# Patient Record
Sex: Female | Born: 1945 | Race: White | Hispanic: No | Smoking: Former smoker
Health system: Southern US, Community
[De-identification: ages and names within clinical notes are randomized; demographics above are authoritative.]

## PROBLEM LIST (undated history)

## (undated) DIAGNOSIS — M1990 Unspecified osteoarthritis, unspecified site: Secondary | ICD-10-CM

## (undated) DIAGNOSIS — E039 Hypothyroidism, unspecified: Secondary | ICD-10-CM

## (undated) DIAGNOSIS — I10 Essential (primary) hypertension: Secondary | ICD-10-CM

## (undated) HISTORY — PX: HAND SURGERY: SHX662

## (undated) HISTORY — DX: Hypothyroidism, unspecified: E03.9

## (undated) HISTORY — PX: TOTAL KNEE ARTHROPLASTY: SHX125

## (undated) HISTORY — DX: Unspecified osteoarthritis, unspecified site: M19.90

## (undated) HISTORY — PX: CATARACT EXTRACTION, BILATERAL: SHX1313

---

## 1997-03-06 ENCOUNTER — Ambulatory Visit (HOSPITAL_COMMUNITY): Admission: RE | Admit: 1997-03-06 | Discharge: 1997-03-06 | Payer: Self-pay | Admitting: *Deleted

## 1998-07-16 ENCOUNTER — Ambulatory Visit (HOSPITAL_COMMUNITY): Admission: RE | Admit: 1998-07-16 | Discharge: 1998-07-16 | Payer: Self-pay | Admitting: *Deleted

## 1998-08-18 ENCOUNTER — Emergency Department (HOSPITAL_COMMUNITY): Admission: EM | Admit: 1998-08-18 | Discharge: 1998-08-18 | Payer: Self-pay | Admitting: Emergency Medicine

## 1998-08-19 ENCOUNTER — Encounter: Payer: Self-pay | Admitting: Emergency Medicine

## 1999-02-20 ENCOUNTER — Other Ambulatory Visit: Admission: RE | Admit: 1999-02-20 | Discharge: 1999-02-20 | Payer: Self-pay | Admitting: *Deleted

## 1999-07-17 ENCOUNTER — Ambulatory Visit (HOSPITAL_COMMUNITY): Admission: RE | Admit: 1999-07-17 | Discharge: 1999-07-17 | Payer: Self-pay | Admitting: *Deleted

## 2000-03-12 ENCOUNTER — Other Ambulatory Visit: Admission: RE | Admit: 2000-03-12 | Discharge: 2000-03-12 | Payer: Self-pay | Admitting: *Deleted

## 2001-12-08 ENCOUNTER — Ambulatory Visit (HOSPITAL_COMMUNITY): Admission: RE | Admit: 2001-12-08 | Discharge: 2001-12-08 | Payer: Self-pay | Admitting: *Deleted

## 2002-08-13 ENCOUNTER — Emergency Department (HOSPITAL_COMMUNITY): Admission: EM | Admit: 2002-08-13 | Discharge: 2002-08-13 | Payer: Self-pay | Admitting: Emergency Medicine

## 2002-08-13 ENCOUNTER — Encounter: Payer: Self-pay | Admitting: Emergency Medicine

## 2002-08-16 ENCOUNTER — Encounter: Payer: Self-pay | Admitting: Family Medicine

## 2002-08-16 ENCOUNTER — Ambulatory Visit (HOSPITAL_COMMUNITY): Admission: RE | Admit: 2002-08-16 | Discharge: 2002-08-16 | Payer: Self-pay | Admitting: Family Medicine

## 2003-01-16 ENCOUNTER — Ambulatory Visit (HOSPITAL_COMMUNITY): Admission: RE | Admit: 2003-01-16 | Discharge: 2003-01-16 | Payer: Self-pay | Admitting: *Deleted

## 2005-03-04 ENCOUNTER — Encounter (INDEPENDENT_AMBULATORY_CARE_PROVIDER_SITE_OTHER): Payer: Self-pay | Admitting: *Deleted

## 2005-03-04 ENCOUNTER — Ambulatory Visit (HOSPITAL_BASED_OUTPATIENT_CLINIC_OR_DEPARTMENT_OTHER): Admission: RE | Admit: 2005-03-04 | Discharge: 2005-03-04 | Payer: Self-pay | Admitting: Orthopedic Surgery

## 2005-04-17 ENCOUNTER — Encounter: Admission: RE | Admit: 2005-04-17 | Discharge: 2005-04-17 | Payer: Self-pay | Admitting: Family Medicine

## 2006-07-12 ENCOUNTER — Encounter: Admission: RE | Admit: 2006-07-12 | Discharge: 2006-07-12 | Payer: Self-pay | Admitting: Family Medicine

## 2007-12-08 ENCOUNTER — Encounter: Admission: RE | Admit: 2007-12-08 | Discharge: 2007-12-08 | Payer: Self-pay | Admitting: Family Medicine

## 2009-06-11 ENCOUNTER — Other Ambulatory Visit: Admission: RE | Admit: 2009-06-11 | Discharge: 2009-06-11 | Payer: Self-pay | Admitting: Family Medicine

## 2010-02-23 ENCOUNTER — Encounter: Payer: Self-pay | Admitting: Family Medicine

## 2010-06-17 ENCOUNTER — Other Ambulatory Visit: Payer: Self-pay | Admitting: Family Medicine

## 2010-06-17 DIAGNOSIS — Z1231 Encounter for screening mammogram for malignant neoplasm of breast: Secondary | ICD-10-CM

## 2010-06-20 ENCOUNTER — Ambulatory Visit
Admission: RE | Admit: 2010-06-20 | Discharge: 2010-06-20 | Disposition: A | Discharge status: Home / Self Care | Payer: BC Managed Care – PPO | Source: Ambulatory Visit | Attending: Family Medicine | Admitting: Family Medicine

## 2010-06-20 DIAGNOSIS — Z1231 Encounter for screening mammogram for malignant neoplasm of breast: Secondary | ICD-10-CM

## 2010-06-20 NOTE — Op Note (Signed)
NAMEOSA, FOGARTY                  ACCOUNT NO.:  [id number]   MEDICAL RECORD NO.:  [id number]          PATIENT TYPE:  AMB   LOCATION:  DSC                          FACILITY:  [facility]   PHYSICIAN:  Leonides Grills, M.D.     DATE OF BIRTH:  Jul 21, 1945   DATE OF PROCEDURE:  03/04/2005  DATE OF DISCHARGE:                                 OPERATIVE REPORT   PREOPERATIVE DIAGNOSIS:  Right third webspace Morton's neuroma.   POSTOPERATIVE DIAGNOSIS:  Right third webspace Morton's neuroma.   OPERATION PERFORMED:  Excision right third webspace Morton's neuroma.   SURGEON:  Leonides Grills, M.D.   ASSISTANT:  Lianne Cure, P.A.   ANESTHESIA:  General.   ESTIMATED BLOOD LOSS:  Minimal.   TOURNIQUET TIME:  Approximately 20 minutes.   COMPLICATIONS:  None.   DISPOSITION:  Stable to PR.   INDICATIONS FOR PROCEDURE:  The patient is a 65 year old female who has had  persistent right forefoot pain that is interfering with her life to the  point where she can not do what she wants to do.  Physical exam and  objective findings were consistent with a Morton's neuroma.  The patient  has consented for the above procedure.  All risks which include infection,  neurovascular injury, persistent pain, worsening pain, prolonged recovery,  recurrence, bleeding and hematoma formation were all explained, questions  were encouraged and answered.   DESCRIPTION OF PROCEDURE:  The patient was brought to the operating room and  placed in supine position after adequate general endotracheal tube  anesthesia was administered as well as Ancef 1 g IV piggyback.  The right  lower extremity was then prepped and draped in sterile manner over a  proximally placed thigh tourniquet.  The limb was gravity exsanguinated and  the tourniquet was elevated to 290 mmHg.  A longitudinal incision over the  dorsal aspect of the right third webspace of the forefoot was then made,  dissection was carried down through skin.  Hemostasis  was obtained.  Dissection was carried between the third and fourth metatarsal head.  Metatarsal head spreader was applied and the intermetatarsal ligament was  then released.  There was a large neuroma just distal to the intermetatarsal  ligament. Distally this was dissected out and the nerve was cut to each  individual digital branch of third and fourth toes.  We then dissected  carefully the nerve proximally to about 1 to 2 cm proximal to the metatarsal  heads into the muscular part of the foot. This was then cut and then given  to pathology.  Tourniquet was  deflated.  Hemostasis was obtained.  The area was copiously irrigated with  normal saline.  Subcu was closed with 3-0 Vicryl.  Skin was closed with 4-0  nylon.  Sterile dressing was applied.  Hard sole shoe was applied.  The  patient was stable to the PR.      Leonides Grills, M.D.  Electronically Signed     PB/MEDQ  D:  03/04/2005  T:  03/04/2005  Job:  [id number]

## 2012-04-06 ENCOUNTER — Other Ambulatory Visit: Payer: Self-pay

## 2012-04-06 DIAGNOSIS — Z1231 Encounter for screening mammogram for malignant neoplasm of breast: Secondary | ICD-10-CM

## 2012-05-03 ENCOUNTER — Ambulatory Visit: Payer: BC Managed Care – PPO

## 2012-06-09 ENCOUNTER — Ambulatory Visit
Admission: RE | Admit: 2012-06-09 | Discharge: 2012-06-09 | Disposition: A | Discharge status: Home / Self Care | Payer: Medicare Other | Source: Ambulatory Visit

## 2012-06-09 DIAGNOSIS — Z1231 Encounter for screening mammogram for malignant neoplasm of breast: Secondary | ICD-10-CM

## 2012-06-09 IMAGING — MG MM DIGITAL SCREENING BILAT
8 series · 8 of 24 positions shown · non-contrast
Comparison: Previous exams.

CLINICAL DATA: Screening.

DIGITAL BILATERAL SCREENING MAMMOGRAM WITH CAD
 DIGITAL BREAST TOMOSYNTHESIS
Digital breast tomosynthesis images are acquired in two
projections.  These images are reviewed in combination with the
digital mammogram, confirming the findings below.

[L CC]
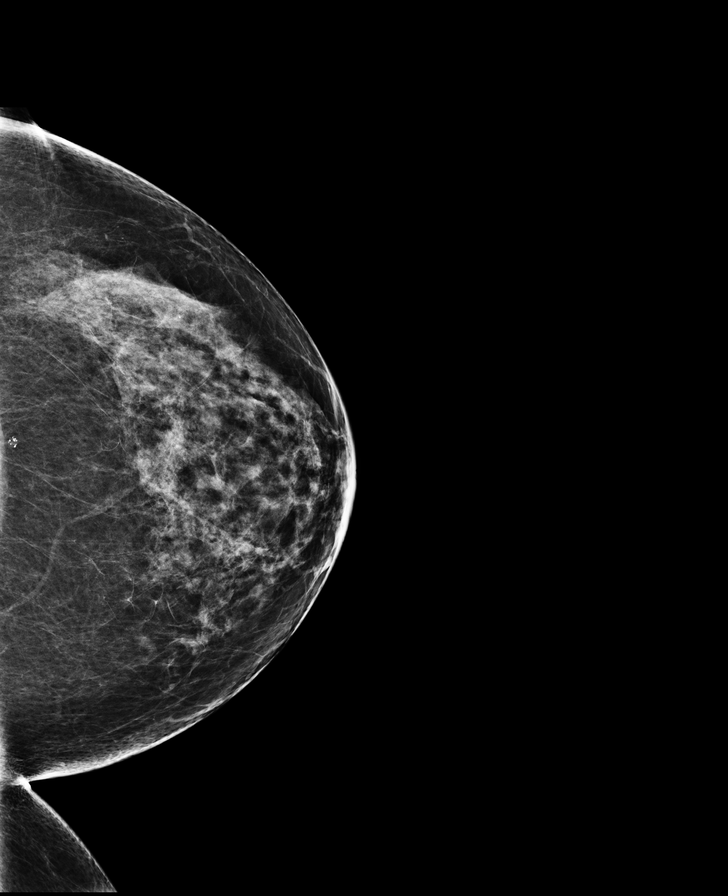

[R CC]
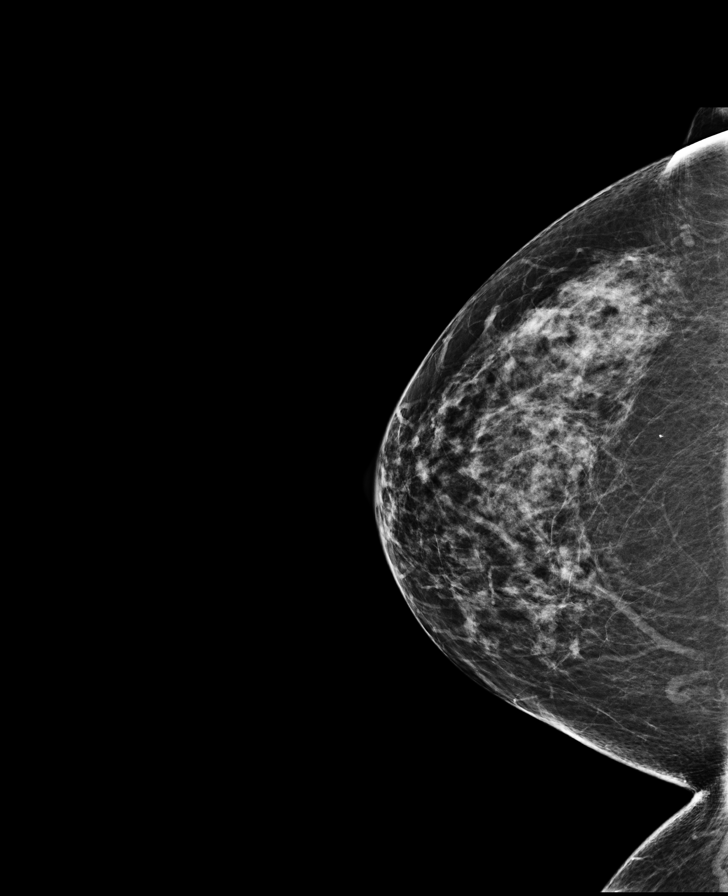

[R MLO]
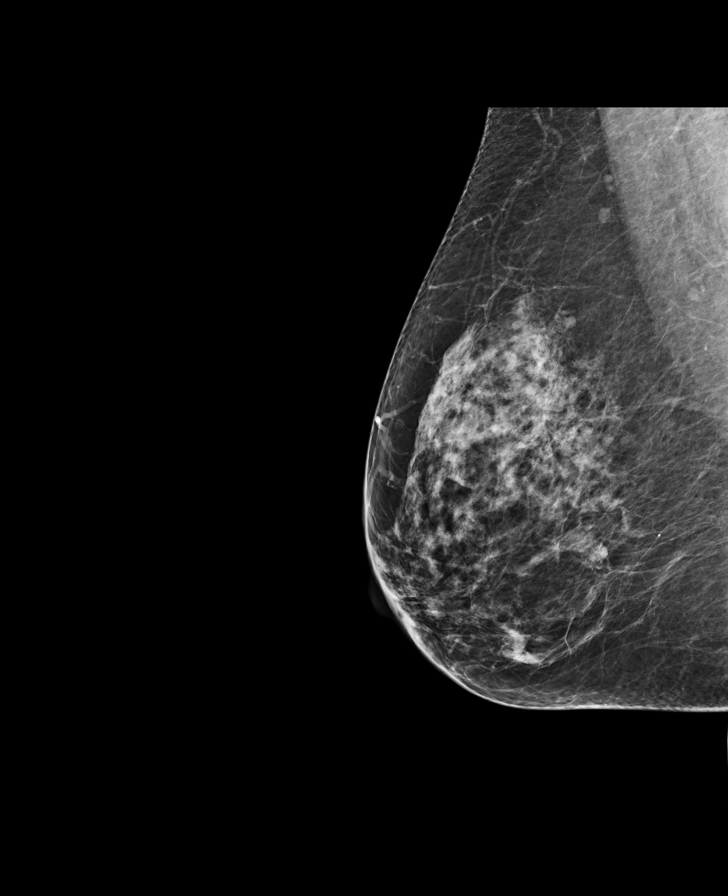

[L MLO]
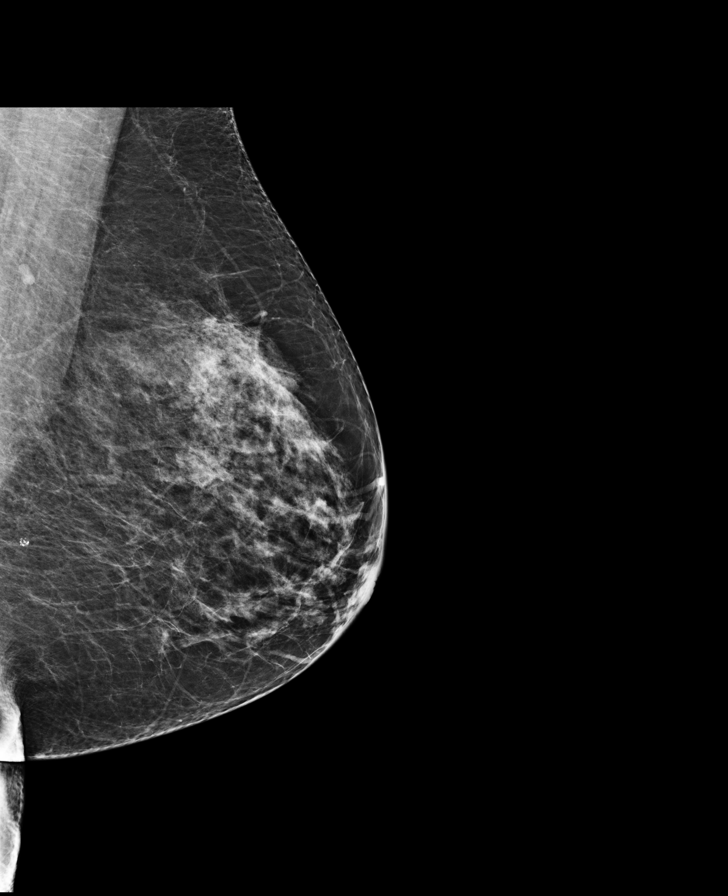

[L MLO tomo · tomo slice 37/73.0]
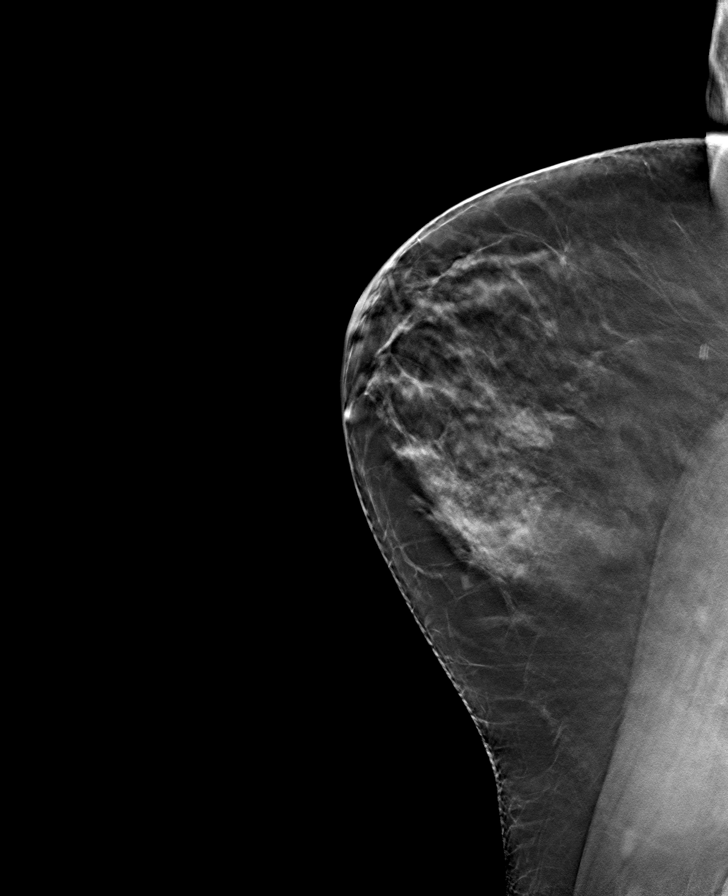

[R CC tomo · tomo slice 35/70.0]
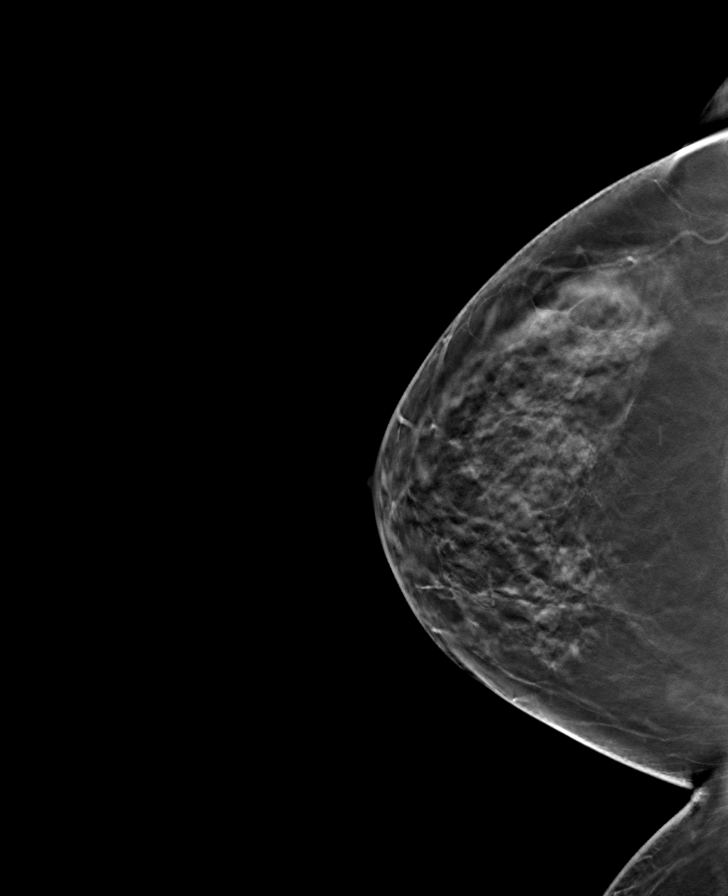

[R MLO tomo · tomo slice 35/70.0]
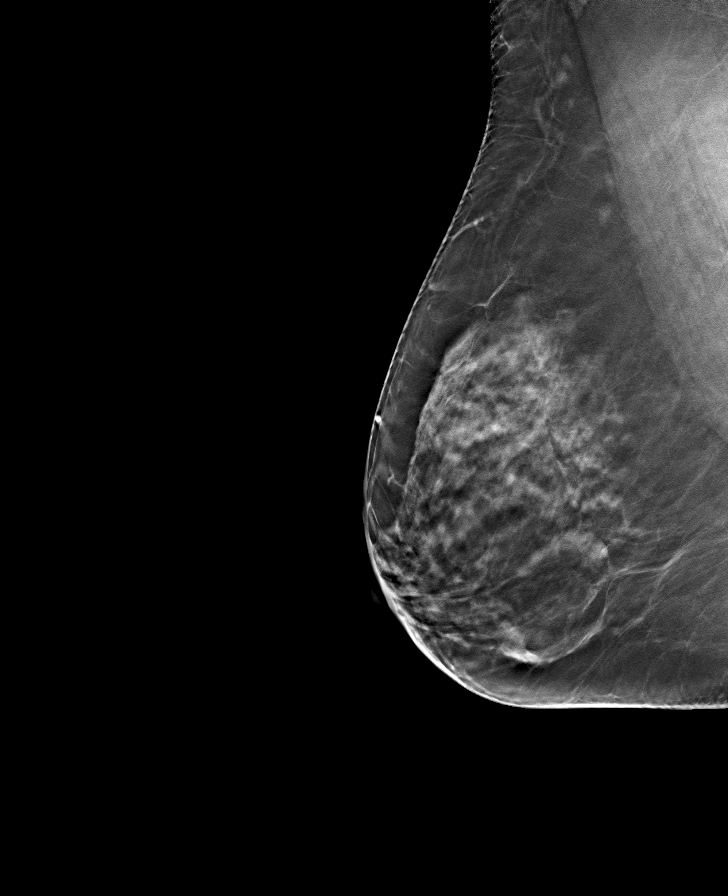

[L CC tomo · tomo slice 37/72.0]
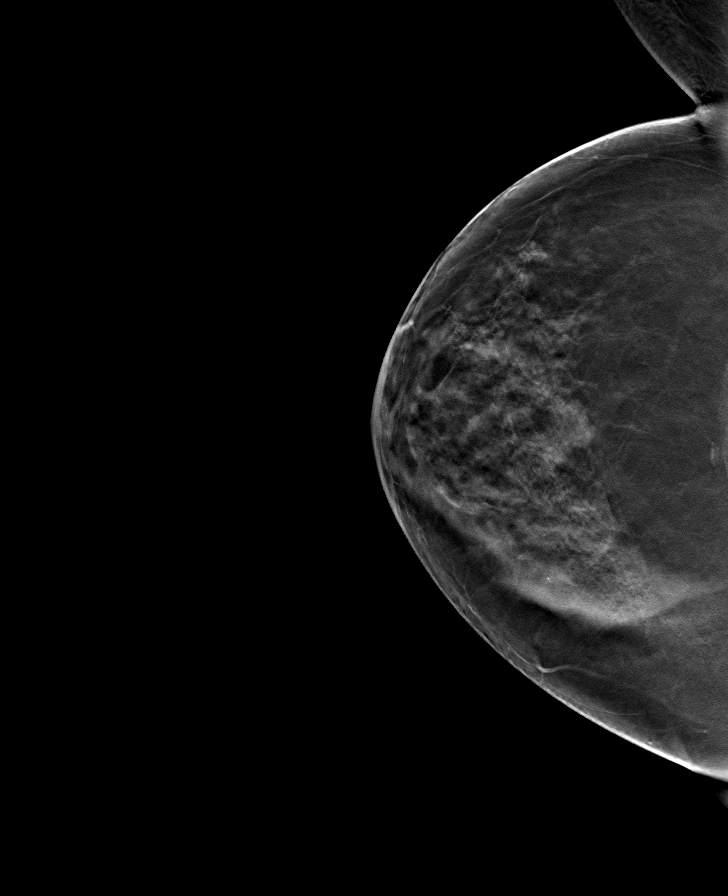

[8 of 24 positions shown; findings below may reference images not displayed]

FINDINGS: ACR Breast Density Category 3: The breast tissue is heterogeneously
dense.

No suspicious masses, architectural distortion, or calcifications
are present.

Images were processed with CAD.
IMPRESSION: No mammographic evidence of malignancy.

A result letter of this screening mammogram will be mailed directly
to the patient.

RECOMMENDATION:
Screening mammogram in one year. (Code:[86])

BI-RADS CATEGORY 1:  Negative.

## 2015-05-07 DIAGNOSIS — D3131 Benign neoplasm of right choroid: Secondary | ICD-10-CM | POA: Diagnosis not present

## 2015-05-07 DIAGNOSIS — H25013 Cortical age-related cataract, bilateral: Secondary | ICD-10-CM | POA: Diagnosis not present

## 2015-07-25 ENCOUNTER — Other Ambulatory Visit: Payer: Self-pay | Admitting: Family Medicine

## 2015-07-25 DIAGNOSIS — Z1231 Encounter for screening mammogram for malignant neoplasm of breast: Secondary | ICD-10-CM

## 2015-08-09 ENCOUNTER — Ambulatory Visit: Payer: Medicare Other

## 2015-08-16 DIAGNOSIS — M25562 Pain in left knee: Secondary | ICD-10-CM | POA: Diagnosis not present

## 2015-08-21 ENCOUNTER — Ambulatory Visit: Payer: Medicare Other

## 2015-08-21 ENCOUNTER — Ambulatory Visit
Admission: RE | Admit: 2015-08-21 | Discharge: 2015-08-21 | Disposition: A | Discharge status: Home / Self Care | Payer: Medicare Other | Source: Ambulatory Visit | Attending: Family Medicine | Admitting: Family Medicine

## 2015-08-21 DIAGNOSIS — Z1231 Encounter for screening mammogram for malignant neoplasm of breast: Secondary | ICD-10-CM

## 2015-08-25 DIAGNOSIS — J302 Other seasonal allergic rhinitis: Secondary | ICD-10-CM | POA: Diagnosis not present

## 2015-08-25 DIAGNOSIS — H60392 Other infective otitis externa, left ear: Secondary | ICD-10-CM | POA: Diagnosis not present

## 2015-09-11 DIAGNOSIS — R42 Dizziness and giddiness: Secondary | ICD-10-CM | POA: Diagnosis not present

## 2015-09-11 DIAGNOSIS — I10 Essential (primary) hypertension: Secondary | ICD-10-CM | POA: Diagnosis not present

## 2015-09-11 DIAGNOSIS — Z23 Encounter for immunization: Secondary | ICD-10-CM | POA: Diagnosis not present

## 2015-09-11 DIAGNOSIS — E039 Hypothyroidism, unspecified: Secondary | ICD-10-CM | POA: Diagnosis not present

## 2015-09-11 DIAGNOSIS — E7800 Pure hypercholesterolemia, unspecified: Secondary | ICD-10-CM | POA: Diagnosis not present

## 2015-09-25 DIAGNOSIS — H8001 Otosclerosis involving oval window, nonobliterative, right ear: Secondary | ICD-10-CM | POA: Diagnosis not present

## 2015-09-25 DIAGNOSIS — H903 Sensorineural hearing loss, bilateral: Secondary | ICD-10-CM | POA: Diagnosis not present

## 2015-12-10 DIAGNOSIS — Z1211 Encounter for screening for malignant neoplasm of colon: Secondary | ICD-10-CM | POA: Diagnosis not present

## 2015-12-10 DIAGNOSIS — R197 Diarrhea, unspecified: Secondary | ICD-10-CM | POA: Diagnosis not present

## 2015-12-11 DIAGNOSIS — R197 Diarrhea, unspecified: Secondary | ICD-10-CM | POA: Diagnosis not present

## 2015-12-19 DIAGNOSIS — Z800 Family history of malignant neoplasm of digestive organs: Secondary | ICD-10-CM | POA: Diagnosis not present

## 2015-12-19 DIAGNOSIS — R197 Diarrhea, unspecified: Secondary | ICD-10-CM | POA: Diagnosis not present

## 2015-12-20 DIAGNOSIS — R197 Diarrhea, unspecified: Secondary | ICD-10-CM | POA: Diagnosis not present

## 2015-12-25 DIAGNOSIS — K5730 Diverticulosis of large intestine without perforation or abscess without bleeding: Secondary | ICD-10-CM | POA: Diagnosis not present

## 2015-12-25 DIAGNOSIS — D126 Benign neoplasm of colon, unspecified: Secondary | ICD-10-CM | POA: Diagnosis not present

## 2015-12-25 DIAGNOSIS — R197 Diarrhea, unspecified: Secondary | ICD-10-CM | POA: Diagnosis not present

## 2015-12-25 DIAGNOSIS — K52832 Lymphocytic colitis: Secondary | ICD-10-CM | POA: Diagnosis not present

## 2015-12-25 DIAGNOSIS — D124 Benign neoplasm of descending colon: Secondary | ICD-10-CM | POA: Diagnosis not present

## 2015-12-31 DIAGNOSIS — K52832 Lymphocytic colitis: Secondary | ICD-10-CM | POA: Diagnosis not present

## 2015-12-31 DIAGNOSIS — D126 Benign neoplasm of colon, unspecified: Secondary | ICD-10-CM | POA: Diagnosis not present

## 2016-01-14 DIAGNOSIS — K52832 Lymphocytic colitis: Secondary | ICD-10-CM | POA: Diagnosis not present

## 2016-03-04 DIAGNOSIS — J111 Influenza due to unidentified influenza virus with other respiratory manifestations: Secondary | ICD-10-CM | POA: Diagnosis not present

## 2016-03-04 DIAGNOSIS — R112 Nausea with vomiting, unspecified: Secondary | ICD-10-CM | POA: Diagnosis not present

## 2016-03-19 DIAGNOSIS — I10 Essential (primary) hypertension: Secondary | ICD-10-CM | POA: Diagnosis not present

## 2016-03-19 DIAGNOSIS — E039 Hypothyroidism, unspecified: Secondary | ICD-10-CM | POA: Diagnosis not present

## 2016-03-19 DIAGNOSIS — E7800 Pure hypercholesterolemia, unspecified: Secondary | ICD-10-CM | POA: Diagnosis not present

## 2016-07-23 DIAGNOSIS — H25813 Combined forms of age-related cataract, bilateral: Secondary | ICD-10-CM | POA: Diagnosis not present

## 2016-07-23 DIAGNOSIS — D3131 Benign neoplasm of right choroid: Secondary | ICD-10-CM | POA: Diagnosis not present

## 2016-07-23 DIAGNOSIS — H31091 Other chorioretinal scars, right eye: Secondary | ICD-10-CM | POA: Diagnosis not present

## 2016-07-24 ENCOUNTER — Other Ambulatory Visit: Payer: Self-pay | Admitting: Family Medicine

## 2016-07-24 DIAGNOSIS — Z1231 Encounter for screening mammogram for malignant neoplasm of breast: Secondary | ICD-10-CM

## 2016-08-24 ENCOUNTER — Ambulatory Visit: Payer: Medicare Other

## 2016-08-26 ENCOUNTER — Inpatient Hospital Stay: Admission: RE | Admit: 2016-08-26 | Payer: Medicare Other | Source: Ambulatory Visit

## 2016-09-15 DIAGNOSIS — D1801 Hemangioma of skin and subcutaneous tissue: Secondary | ICD-10-CM | POA: Diagnosis not present

## 2016-09-15 DIAGNOSIS — L820 Inflamed seborrheic keratosis: Secondary | ICD-10-CM | POA: Diagnosis not present

## 2016-09-15 DIAGNOSIS — I788 Other diseases of capillaries: Secondary | ICD-10-CM | POA: Diagnosis not present

## 2016-09-15 DIAGNOSIS — L821 Other seborrheic keratosis: Secondary | ICD-10-CM | POA: Diagnosis not present

## 2016-10-07 DIAGNOSIS — K52832 Lymphocytic colitis: Secondary | ICD-10-CM | POA: Diagnosis not present

## 2016-11-16 DIAGNOSIS — Z23 Encounter for immunization: Secondary | ICD-10-CM | POA: Diagnosis not present

## 2016-11-16 DIAGNOSIS — E7800 Pure hypercholesterolemia, unspecified: Secondary | ICD-10-CM | POA: Diagnosis not present

## 2016-11-16 DIAGNOSIS — I10 Essential (primary) hypertension: Secondary | ICD-10-CM | POA: Diagnosis not present

## 2016-11-16 DIAGNOSIS — E039 Hypothyroidism, unspecified: Secondary | ICD-10-CM | POA: Diagnosis not present

## 2017-05-17 DIAGNOSIS — E7800 Pure hypercholesterolemia, unspecified: Secondary | ICD-10-CM | POA: Diagnosis not present

## 2017-05-17 DIAGNOSIS — F4321 Adjustment disorder with depressed mood: Secondary | ICD-10-CM | POA: Diagnosis not present

## 2017-05-17 DIAGNOSIS — E039 Hypothyroidism, unspecified: Secondary | ICD-10-CM | POA: Diagnosis not present

## 2017-05-17 DIAGNOSIS — I10 Essential (primary) hypertension: Secondary | ICD-10-CM | POA: Diagnosis not present

## 2017-11-23 DIAGNOSIS — E039 Hypothyroidism, unspecified: Secondary | ICD-10-CM | POA: Diagnosis not present

## 2017-11-23 DIAGNOSIS — Z1389 Encounter for screening for other disorder: Secondary | ICD-10-CM | POA: Diagnosis not present

## 2017-11-23 DIAGNOSIS — I10 Essential (primary) hypertension: Secondary | ICD-10-CM | POA: Diagnosis not present

## 2017-11-23 DIAGNOSIS — Z0000 Encounter for general adult medical examination without abnormal findings: Secondary | ICD-10-CM | POA: Diagnosis not present

## 2017-11-26 ENCOUNTER — Other Ambulatory Visit: Payer: Self-pay | Admitting: Family Medicine

## 2017-11-26 DIAGNOSIS — E2839 Other primary ovarian failure: Secondary | ICD-10-CM

## 2017-11-26 DIAGNOSIS — Z1231 Encounter for screening mammogram for malignant neoplasm of breast: Secondary | ICD-10-CM

## 2017-12-28 ENCOUNTER — Encounter: Payer: Self-pay | Admitting: Radiology

## 2017-12-28 ENCOUNTER — Ambulatory Visit
Admission: RE | Admit: 2017-12-28 | Discharge: 2017-12-28 | Disposition: A | Discharge status: Home / Self Care | Payer: Medicare Other | Source: Ambulatory Visit | Attending: Family Medicine | Admitting: Family Medicine

## 2017-12-28 DIAGNOSIS — Z1231 Encounter for screening mammogram for malignant neoplasm of breast: Secondary | ICD-10-CM

## 2017-12-28 DIAGNOSIS — M8589 Other specified disorders of bone density and structure, multiple sites: Secondary | ICD-10-CM | POA: Diagnosis not present

## 2017-12-28 DIAGNOSIS — E2839 Other primary ovarian failure: Secondary | ICD-10-CM

## 2017-12-28 IMAGING — MG DIGITAL SCREENING BILATERAL MAMMOGRAM WITH TOMO AND CAD
8 series · 8 of 24 positions shown · non-contrast
Comparison: Previous exam(s).

CLINICAL DATA: Screening.

EXAM:
DIGITAL SCREENING BILATERAL MAMMOGRAM WITH TOMO AND CAD

[L CC synth-2D]
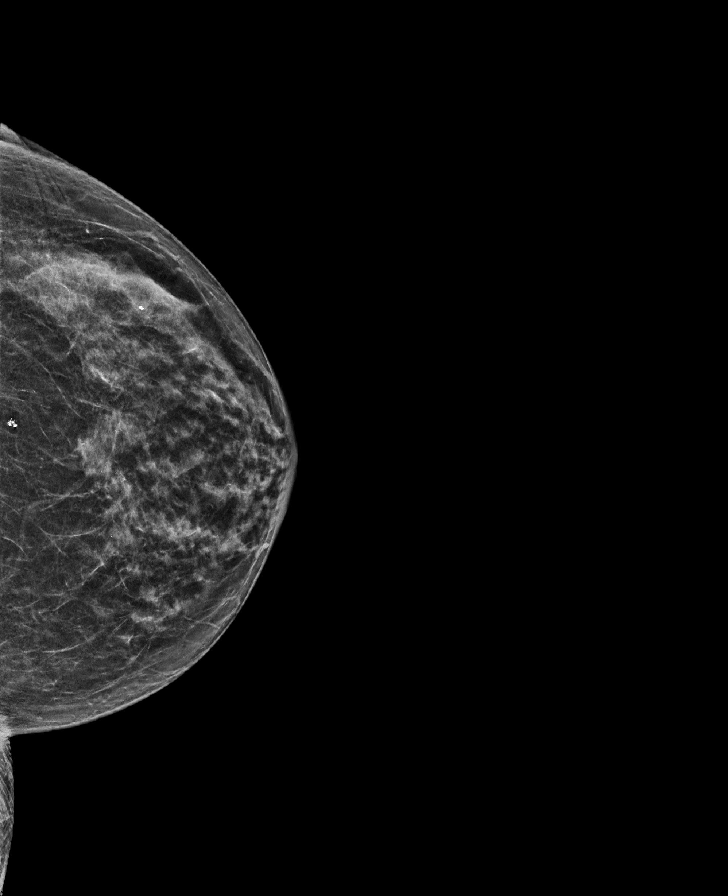

[L MLO synth-2D]
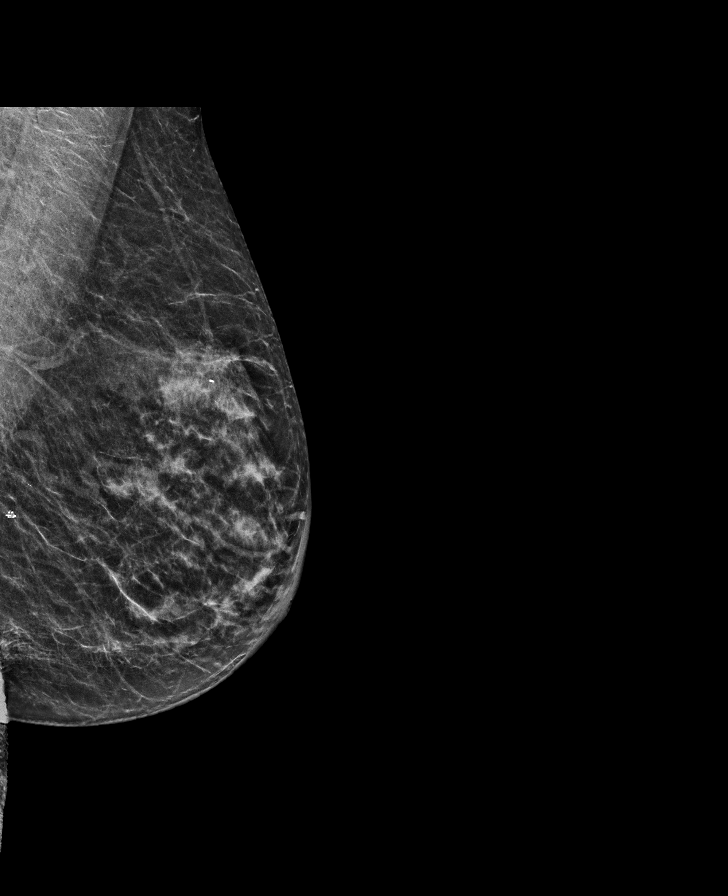

[R MLO synth-2D]
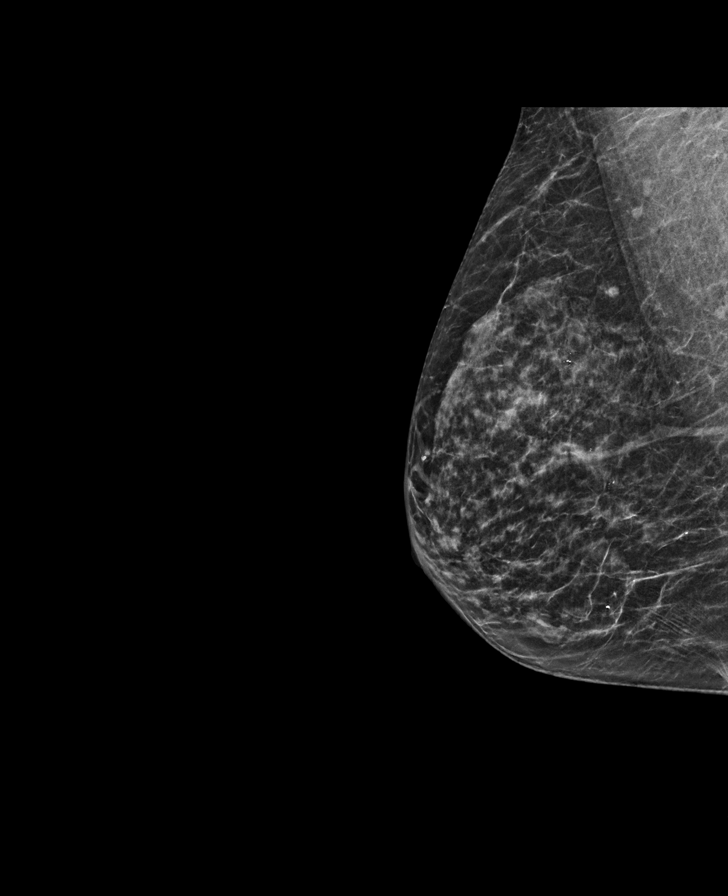

[R CC synth-2D]
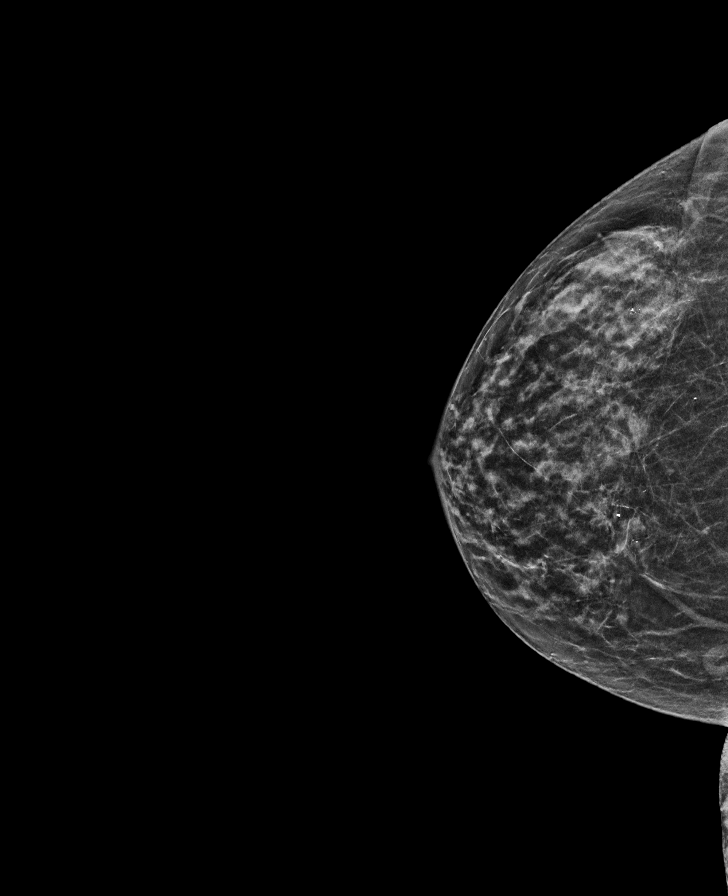

[R MLO tomo · tomo slice 27/53.0]
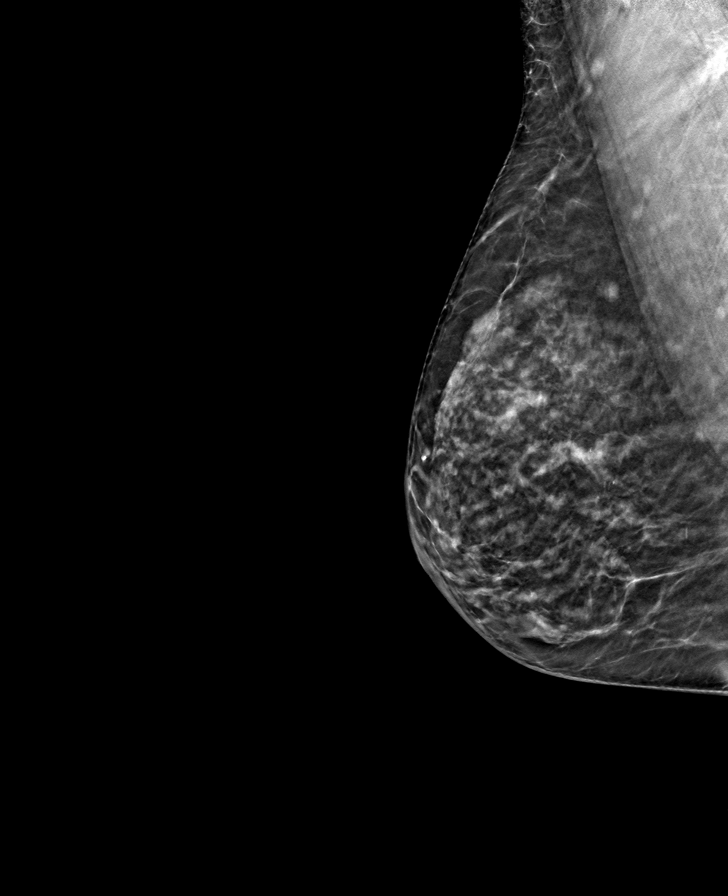

[L CC tomo · tomo slice 29/58.0]
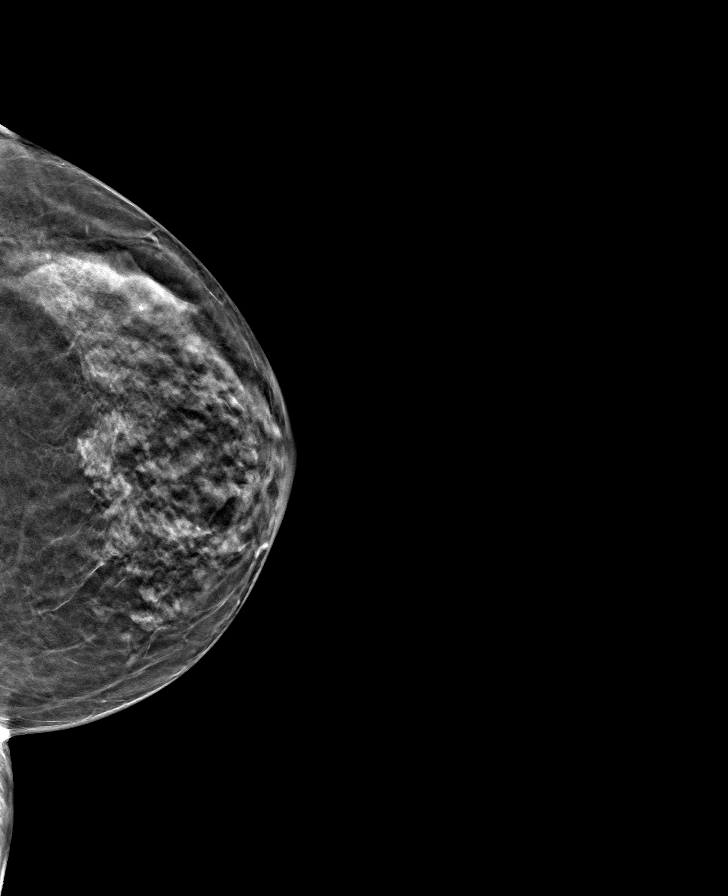

[L MLO tomo · tomo slice 29/57.0]
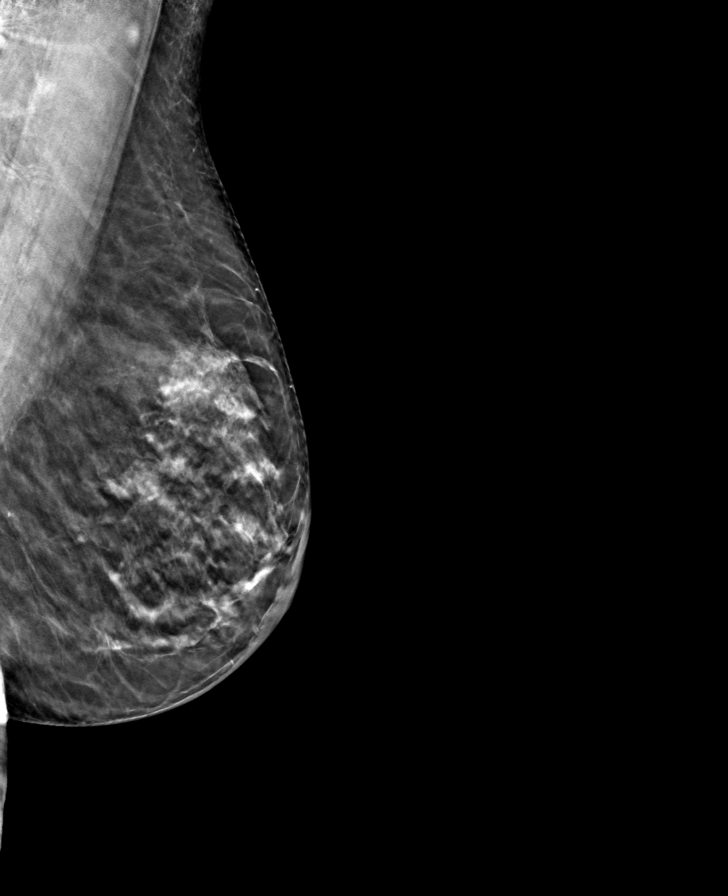

[R CC tomo · tomo slice 27/54.0]
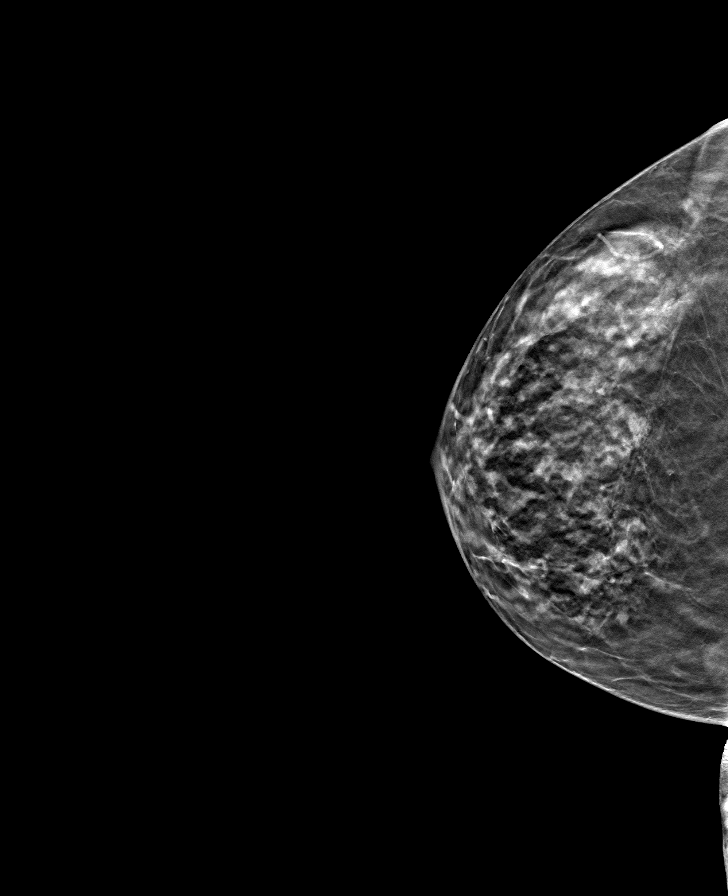

[8 of 24 positions shown; findings below may reference images not displayed]

ACR Breast Density Category c: The breast tissue is heterogeneously
dense, which may obscure small masses.
FINDINGS: There are no findings suspicious for malignancy. Images were
processed with CAD.
IMPRESSION: No mammographic evidence of malignancy. A result letter of this
screening mammogram will be mailed directly to the patient.

RECOMMENDATION:
Screening mammogram in one year. (Code:[5V])

BI-RADS CATEGORY  1: Negative.

## 2018-03-18 DIAGNOSIS — M2241 Chondromalacia patellae, right knee: Secondary | ICD-10-CM | POA: Diagnosis not present

## 2018-07-14 DIAGNOSIS — S83242A Other tear of medial meniscus, current injury, left knee, initial encounter: Secondary | ICD-10-CM | POA: Diagnosis not present

## 2018-07-22 DIAGNOSIS — M25562 Pain in left knee: Secondary | ICD-10-CM | POA: Diagnosis not present

## 2018-07-28 DIAGNOSIS — M545 Low back pain: Secondary | ICD-10-CM | POA: Diagnosis not present

## 2018-07-28 DIAGNOSIS — M1712 Unilateral primary osteoarthritis, left knee: Secondary | ICD-10-CM | POA: Diagnosis not present

## 2018-07-28 DIAGNOSIS — M1612 Unilateral primary osteoarthritis, left hip: Secondary | ICD-10-CM | POA: Diagnosis not present

## 2018-08-16 DIAGNOSIS — M1712 Unilateral primary osteoarthritis, left knee: Secondary | ICD-10-CM | POA: Diagnosis not present

## 2018-08-23 DIAGNOSIS — M1712 Unilateral primary osteoarthritis, left knee: Secondary | ICD-10-CM | POA: Diagnosis not present

## 2018-08-30 DIAGNOSIS — M1712 Unilateral primary osteoarthritis, left knee: Secondary | ICD-10-CM | POA: Diagnosis not present

## 2018-10-04 DIAGNOSIS — M1712 Unilateral primary osteoarthritis, left knee: Secondary | ICD-10-CM | POA: Diagnosis not present

## 2018-10-24 DIAGNOSIS — M25562 Pain in left knee: Secondary | ICD-10-CM | POA: Diagnosis not present

## 2018-10-24 DIAGNOSIS — M5416 Radiculopathy, lumbar region: Secondary | ICD-10-CM | POA: Diagnosis not present

## 2018-10-24 DIAGNOSIS — G8929 Other chronic pain: Secondary | ICD-10-CM | POA: Diagnosis not present

## 2018-11-14 ENCOUNTER — Encounter (HOSPITAL_COMMUNITY): Payer: Medicare Other

## 2018-11-14 ENCOUNTER — Other Ambulatory Visit: Payer: Self-pay

## 2018-11-14 DIAGNOSIS — Z20822 Contact with and (suspected) exposure to covid-19: Secondary | ICD-10-CM

## 2018-11-14 DIAGNOSIS — Z20828 Contact with and (suspected) exposure to other viral communicable diseases: Secondary | ICD-10-CM | POA: Diagnosis not present

## 2018-11-15 LAB — NOVEL CORONAVIRUS, NAA: SARS-CoV-2, NAA: NOT DETECTED

## 2018-11-21 ENCOUNTER — Inpatient Hospital Stay: Admit: 2018-11-21 | Payer: Medicare Other | Admitting: Orthopedic Surgery

## 2018-11-21 SURGERY — ARTHROPLASTY, KNEE, TOTAL
Anesthesia: Spinal | Site: Knee | Laterality: Left

## 2018-11-29 ENCOUNTER — Other Ambulatory Visit: Payer: Self-pay | Admitting: Family Medicine

## 2018-11-29 DIAGNOSIS — Z1231 Encounter for screening mammogram for malignant neoplasm of breast: Secondary | ICD-10-CM

## 2018-12-05 DIAGNOSIS — Z1389 Encounter for screening for other disorder: Secondary | ICD-10-CM | POA: Diagnosis not present

## 2018-12-05 DIAGNOSIS — E039 Hypothyroidism, unspecified: Secondary | ICD-10-CM | POA: Diagnosis not present

## 2018-12-05 DIAGNOSIS — I10 Essential (primary) hypertension: Secondary | ICD-10-CM | POA: Diagnosis not present

## 2018-12-05 DIAGNOSIS — Z0000 Encounter for general adult medical examination without abnormal findings: Secondary | ICD-10-CM | POA: Diagnosis not present

## 2018-12-08 DIAGNOSIS — M25562 Pain in left knee: Secondary | ICD-10-CM | POA: Diagnosis not present

## 2018-12-08 DIAGNOSIS — M545 Low back pain: Secondary | ICD-10-CM | POA: Diagnosis not present

## 2018-12-15 DIAGNOSIS — M25562 Pain in left knee: Secondary | ICD-10-CM | POA: Diagnosis not present

## 2018-12-15 DIAGNOSIS — M545 Low back pain: Secondary | ICD-10-CM | POA: Diagnosis not present

## 2018-12-16 DIAGNOSIS — M25562 Pain in left knee: Secondary | ICD-10-CM | POA: Diagnosis not present

## 2018-12-16 DIAGNOSIS — M545 Low back pain: Secondary | ICD-10-CM | POA: Diagnosis not present

## 2018-12-20 DIAGNOSIS — M545 Low back pain: Secondary | ICD-10-CM | POA: Diagnosis not present

## 2018-12-20 DIAGNOSIS — M25562 Pain in left knee: Secondary | ICD-10-CM | POA: Diagnosis not present

## 2018-12-21 DIAGNOSIS — L820 Inflamed seborrheic keratosis: Secondary | ICD-10-CM | POA: Diagnosis not present

## 2018-12-21 DIAGNOSIS — L570 Actinic keratosis: Secondary | ICD-10-CM | POA: Diagnosis not present

## 2018-12-21 DIAGNOSIS — D485 Neoplasm of uncertain behavior of skin: Secondary | ICD-10-CM | POA: Diagnosis not present

## 2018-12-21 DIAGNOSIS — L821 Other seborrheic keratosis: Secondary | ICD-10-CM | POA: Diagnosis not present

## 2018-12-21 DIAGNOSIS — L853 Xerosis cutis: Secondary | ICD-10-CM | POA: Diagnosis not present

## 2018-12-21 DIAGNOSIS — D1801 Hemangioma of skin and subcutaneous tissue: Secondary | ICD-10-CM | POA: Diagnosis not present

## 2018-12-22 DIAGNOSIS — M545 Low back pain: Secondary | ICD-10-CM | POA: Diagnosis not present

## 2018-12-22 DIAGNOSIS — E039 Hypothyroidism, unspecified: Secondary | ICD-10-CM | POA: Diagnosis not present

## 2018-12-22 DIAGNOSIS — E7800 Pure hypercholesterolemia, unspecified: Secondary | ICD-10-CM | POA: Diagnosis not present

## 2018-12-22 DIAGNOSIS — M25562 Pain in left knee: Secondary | ICD-10-CM | POA: Diagnosis not present

## 2018-12-22 DIAGNOSIS — Z23 Encounter for immunization: Secondary | ICD-10-CM | POA: Diagnosis not present

## 2018-12-22 DIAGNOSIS — I10 Essential (primary) hypertension: Secondary | ICD-10-CM | POA: Diagnosis not present

## 2018-12-26 DIAGNOSIS — M25562 Pain in left knee: Secondary | ICD-10-CM | POA: Diagnosis not present

## 2018-12-26 DIAGNOSIS — M545 Low back pain: Secondary | ICD-10-CM | POA: Diagnosis not present

## 2018-12-27 DIAGNOSIS — M545 Low back pain: Secondary | ICD-10-CM | POA: Diagnosis not present

## 2018-12-27 DIAGNOSIS — M25562 Pain in left knee: Secondary | ICD-10-CM | POA: Diagnosis not present

## 2019-01-02 ENCOUNTER — Other Ambulatory Visit: Payer: Self-pay

## 2019-01-02 ENCOUNTER — Ambulatory Visit
Admission: RE | Admit: 2019-01-02 | Discharge: 2019-01-02 | Disposition: A | Discharge status: Home / Self Care | Payer: Medicare Other | Source: Ambulatory Visit | Attending: Family Medicine | Admitting: Family Medicine

## 2019-01-02 DIAGNOSIS — Z1231 Encounter for screening mammogram for malignant neoplasm of breast: Secondary | ICD-10-CM | POA: Diagnosis not present

## 2019-01-02 IMAGING — MG DIGITAL SCREENING BILAT W/ TOMO W/ CAD
8 series · 9 of 24 positions shown · non-contrast
Comparison: Previous exam(s).

CLINICAL DATA: Screening.

EXAM:
DIGITAL SCREENING BILATERAL MAMMOGRAM WITH TOMO AND CAD

[R MLO synth-2D]
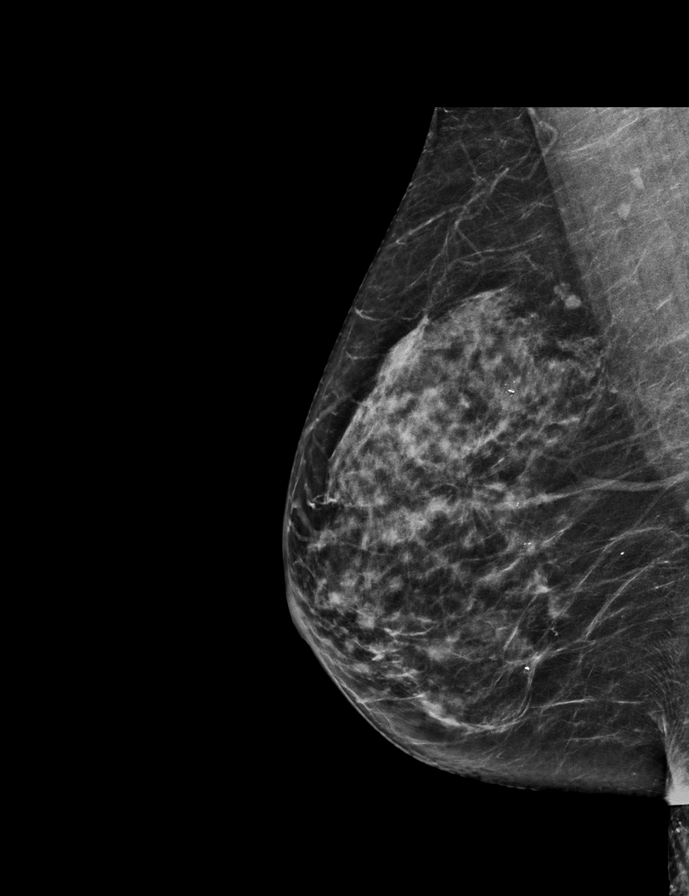

[L MLO synth-2D]
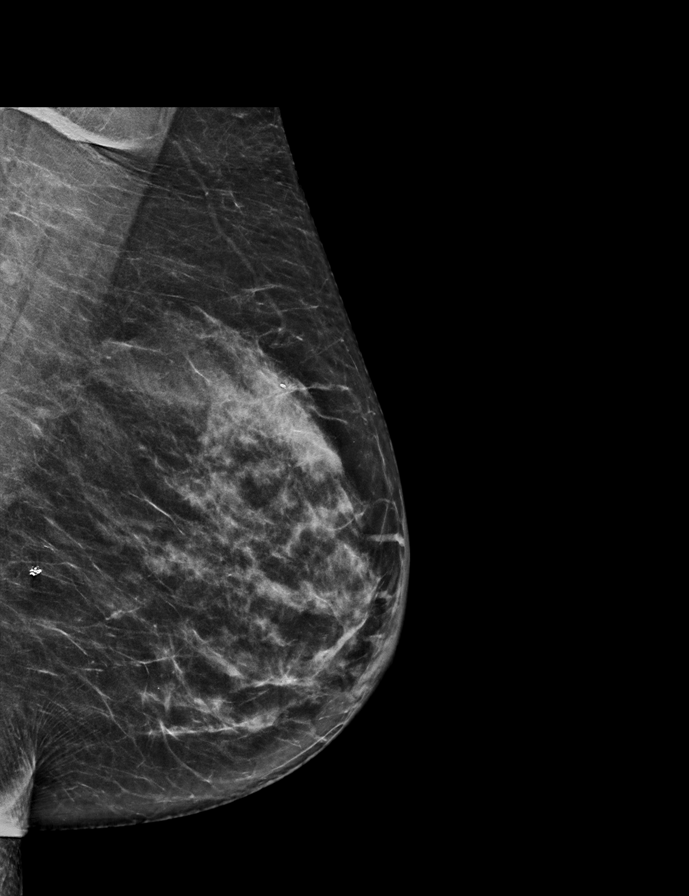

[R CC synth-2D]
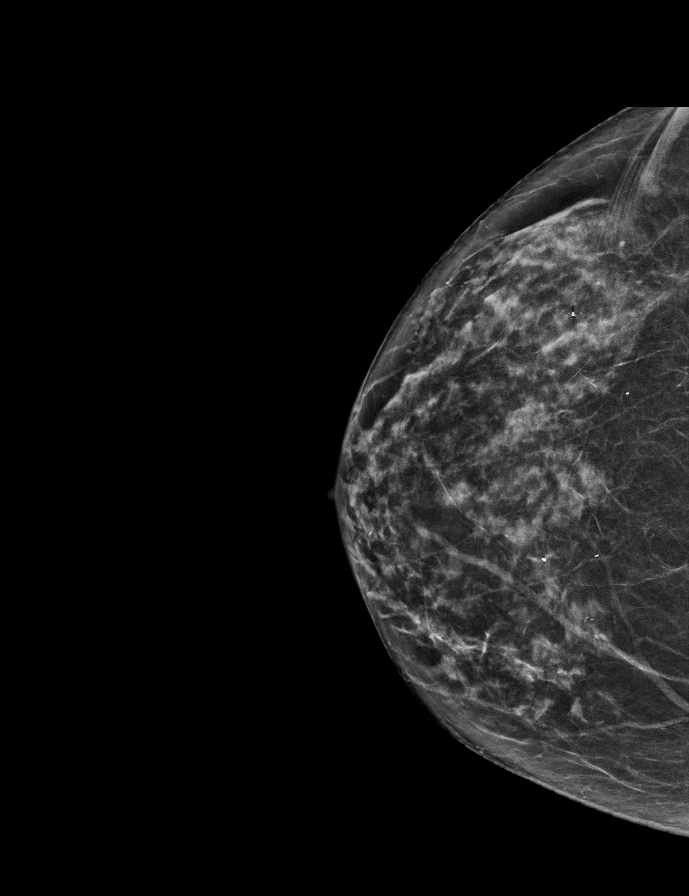

[L CC synth-2D]
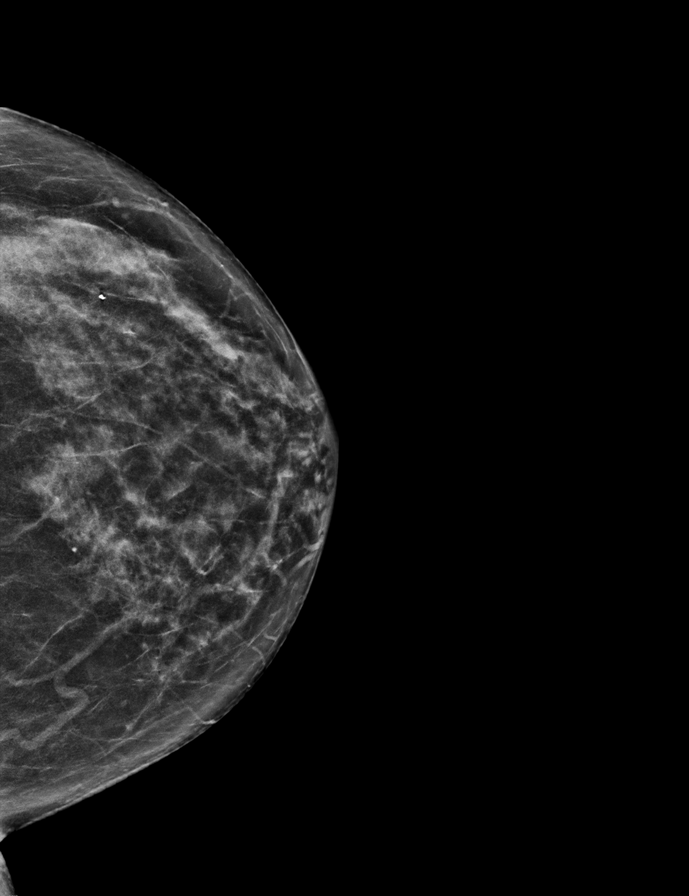

[L MLO tomo · 2 of 63 frames shown]
[frame 21/63]
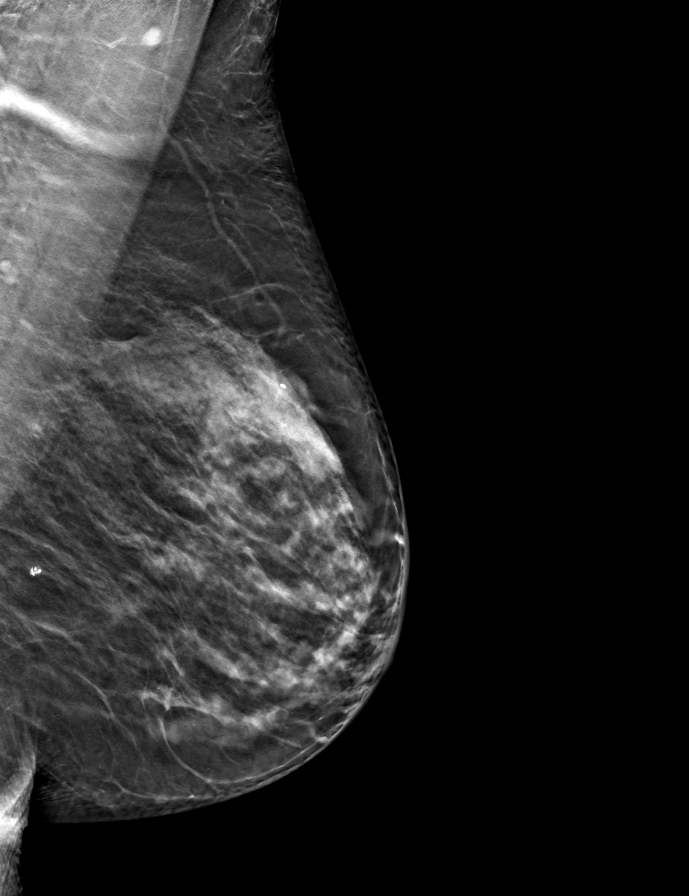
[frame 32/63]
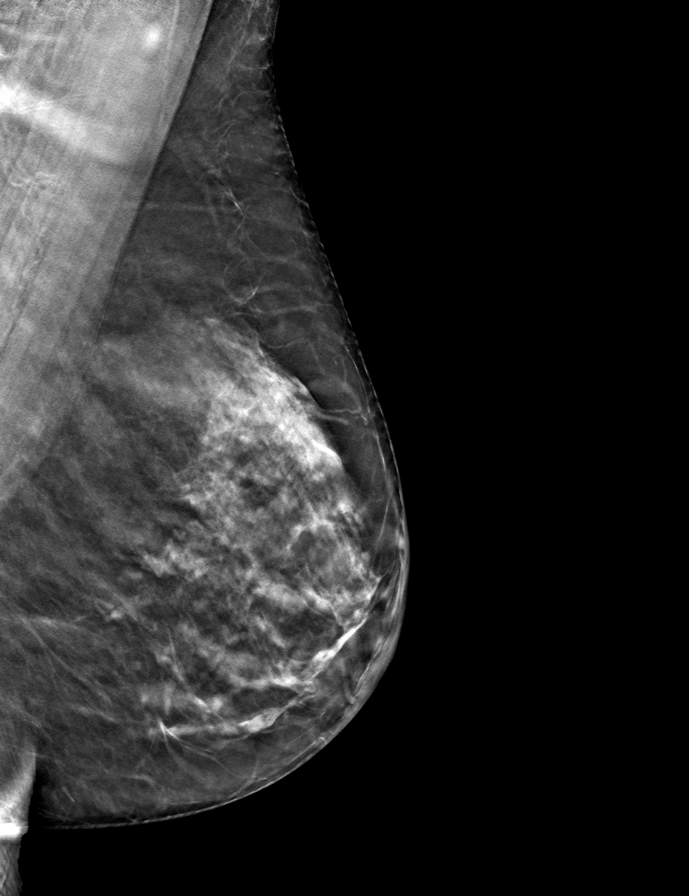

[R CC tomo · tomo slice 29/58.0]
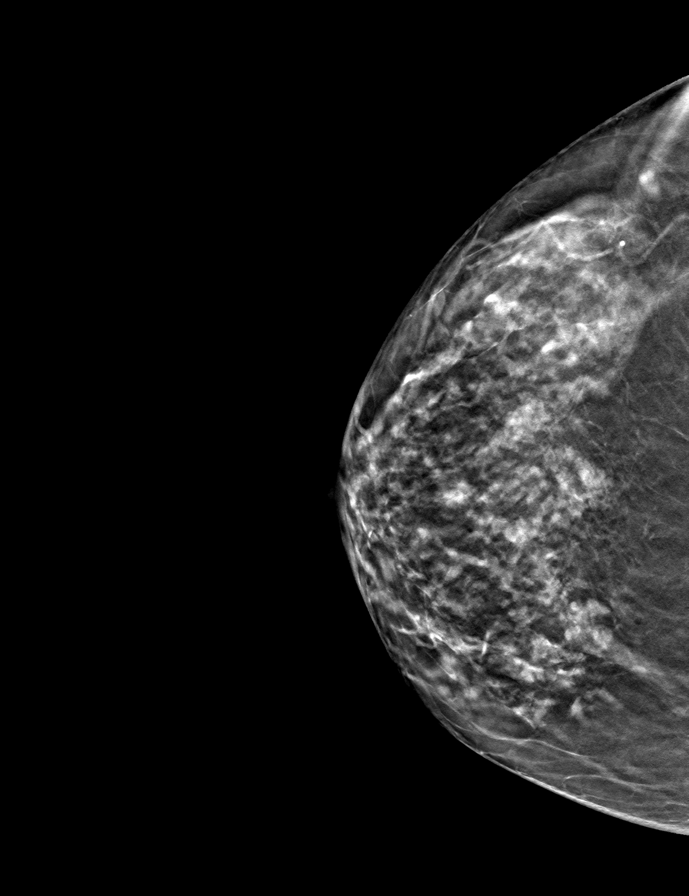

[R MLO tomo · tomo slice 31/61.0]
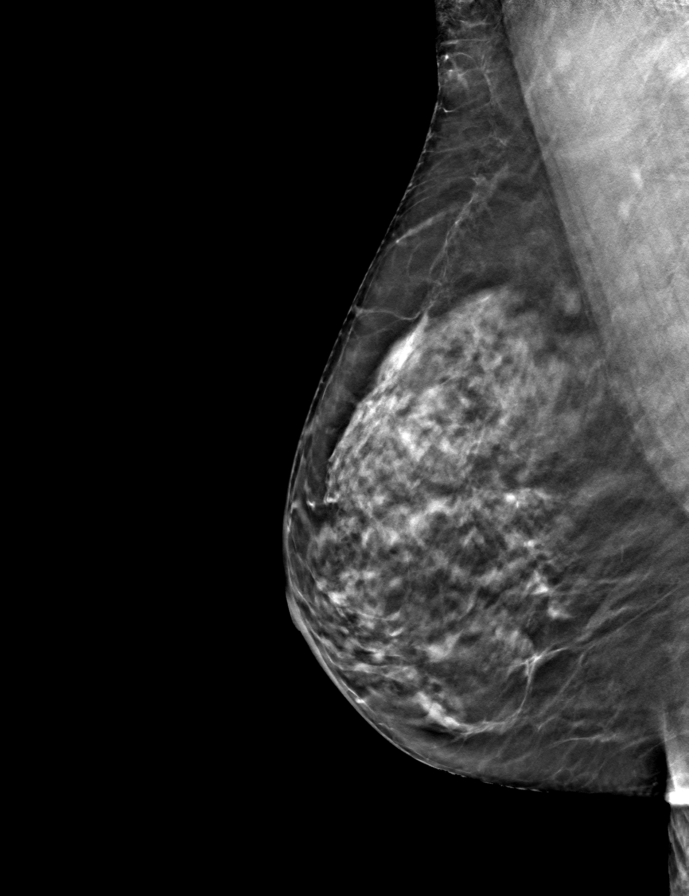

[L CC tomo · tomo slice 29/58.0]
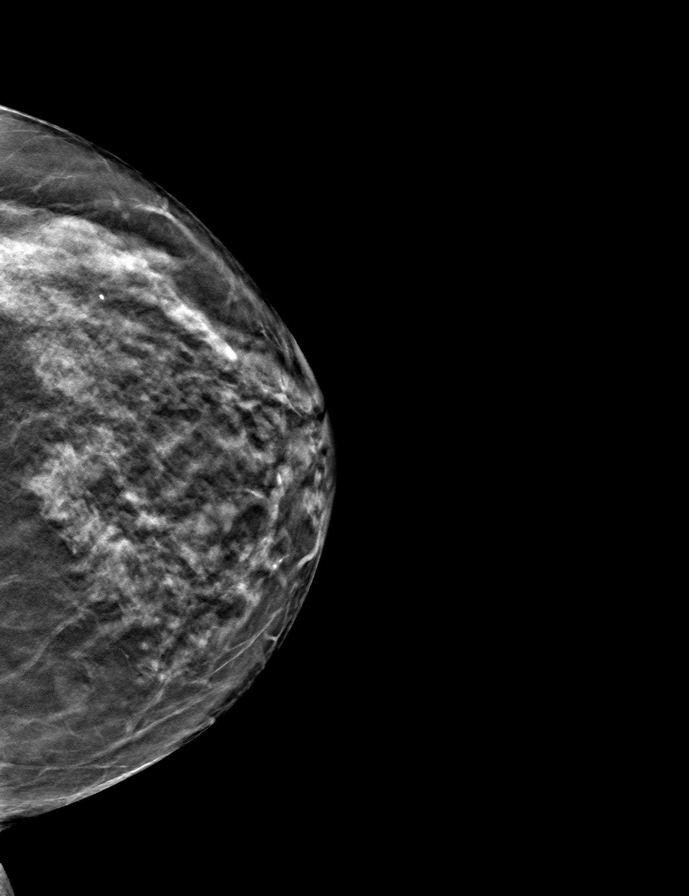

[9 of 24 positions shown; findings below may reference images not displayed]

ACR Breast Density Category c: The breast tissue is heterogeneously
dense, which may obscure small masses.
FINDINGS: There are no findings suspicious for malignancy. Images were
processed with CAD.
IMPRESSION: No mammographic evidence of malignancy. A result letter of this
screening mammogram will be mailed directly to the patient.

RECOMMENDATION:
Screening mammogram in one year. (Code:[5V])

BI-RADS CATEGORY  1: Negative.

## 2019-01-05 DIAGNOSIS — M25562 Pain in left knee: Secondary | ICD-10-CM | POA: Diagnosis not present

## 2019-01-05 DIAGNOSIS — M545 Low back pain: Secondary | ICD-10-CM | POA: Diagnosis not present

## 2019-01-06 DIAGNOSIS — M545 Low back pain: Secondary | ICD-10-CM | POA: Diagnosis not present

## 2019-01-06 DIAGNOSIS — M25562 Pain in left knee: Secondary | ICD-10-CM | POA: Diagnosis not present

## 2019-01-08 ENCOUNTER — Other Ambulatory Visit: Payer: Self-pay

## 2019-01-08 ENCOUNTER — Emergency Department (HOSPITAL_COMMUNITY)
Admission: EM | Admit: 2019-01-08 | Discharge: 2019-01-08 | Disposition: A | Discharge status: Home / Self Care | Payer: Medicare Other | Attending: Emergency Medicine | Admitting: Emergency Medicine

## 2019-01-08 ENCOUNTER — Encounter (HOSPITAL_COMMUNITY): Payer: Self-pay

## 2019-01-08 ENCOUNTER — Emergency Department (HOSPITAL_COMMUNITY): Payer: Medicare Other

## 2019-01-08 DIAGNOSIS — S52502A Unspecified fracture of the lower end of left radius, initial encounter for closed fracture: Secondary | ICD-10-CM

## 2019-01-08 DIAGNOSIS — W010XXA Fall on same level from slipping, tripping and stumbling without subsequent striking against object, initial encounter: Secondary | ICD-10-CM | POA: Insufficient documentation

## 2019-01-08 DIAGNOSIS — R52 Pain, unspecified: Secondary | ICD-10-CM | POA: Diagnosis not present

## 2019-01-08 DIAGNOSIS — Z23 Encounter for immunization: Secondary | ICD-10-CM | POA: Insufficient documentation

## 2019-01-08 DIAGNOSIS — W19XXXA Unspecified fall, initial encounter: Secondary | ICD-10-CM

## 2019-01-08 DIAGNOSIS — S01511A Laceration without foreign body of lip, initial encounter: Secondary | ICD-10-CM

## 2019-01-08 DIAGNOSIS — Y999 Unspecified external cause status: Secondary | ICD-10-CM | POA: Insufficient documentation

## 2019-01-08 DIAGNOSIS — S0990XA Unspecified injury of head, initial encounter: Secondary | ICD-10-CM | POA: Diagnosis not present

## 2019-01-08 DIAGNOSIS — S52572A Other intraarticular fracture of lower end of left radius, initial encounter for closed fracture: Secondary | ICD-10-CM | POA: Diagnosis not present

## 2019-01-08 DIAGNOSIS — S199XXA Unspecified injury of neck, initial encounter: Secondary | ICD-10-CM | POA: Diagnosis not present

## 2019-01-08 DIAGNOSIS — S299XXA Unspecified injury of thorax, initial encounter: Secondary | ICD-10-CM | POA: Diagnosis not present

## 2019-01-08 DIAGNOSIS — Y93K1 Activity, walking an animal: Secondary | ICD-10-CM | POA: Insufficient documentation

## 2019-01-08 DIAGNOSIS — Y92830 Public park as the place of occurrence of the external cause: Secondary | ICD-10-CM | POA: Insufficient documentation

## 2019-01-08 DIAGNOSIS — S52592A Other fractures of lower end of left radius, initial encounter for closed fracture: Secondary | ICD-10-CM | POA: Diagnosis not present

## 2019-01-08 DIAGNOSIS — S0083XA Contusion of other part of head, initial encounter: Secondary | ICD-10-CM

## 2019-01-08 DIAGNOSIS — R0789 Other chest pain: Secondary | ICD-10-CM | POA: Diagnosis not present

## 2019-01-08 DIAGNOSIS — R4020 Unspecified coma: Secondary | ICD-10-CM | POA: Diagnosis not present

## 2019-01-08 DIAGNOSIS — S0993XA Unspecified injury of face, initial encounter: Secondary | ICD-10-CM | POA: Diagnosis not present

## 2019-01-08 IMAGING — CT CT CERVICAL SPINE W/O CM
4 of 10 series · 7 of 33 positions shown, 8 images · non-contrast
Comparison: Report from a CT head dated [DATE].

CLINICAL DATA: Head, face, and neck trauma.

EXAM:
CT HEAD WITHOUT CONTRAST
CT MAXILLOFACIAL WITHOUT CONTRAST
CT CERVICAL SPINE WITHOUT CONTRAST
TECHNIQUE: Multidetector CT imaging of the head, cervical spine, and
maxillofacial structures were performed using the standard protocol
without intravenous contrast. Multiplanar CT image reconstructions
of the cervical spine and maxillofacial structures were also
generated.

[Series 9: c spine soft · axial · 0.29mm/px · z∈[+1405,+1461]mm · 2 of 84 slices shown]
[im 28/84  soft-tissue]
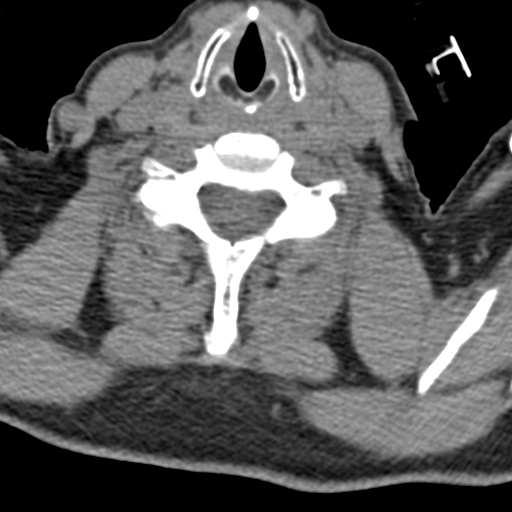
[im 56/84  soft-tissue]
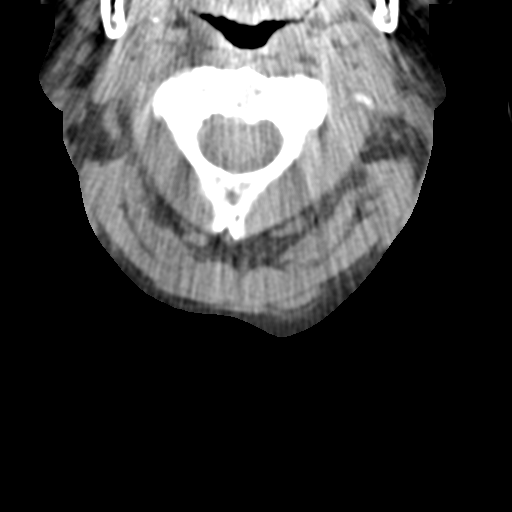

[Series 11: orthogonal axials · axial · 0.23mm/px · z∈[+1369,+1420]mm · 2 of 87 slices shown, 3 images]
[im 29/87  soft-tissue]
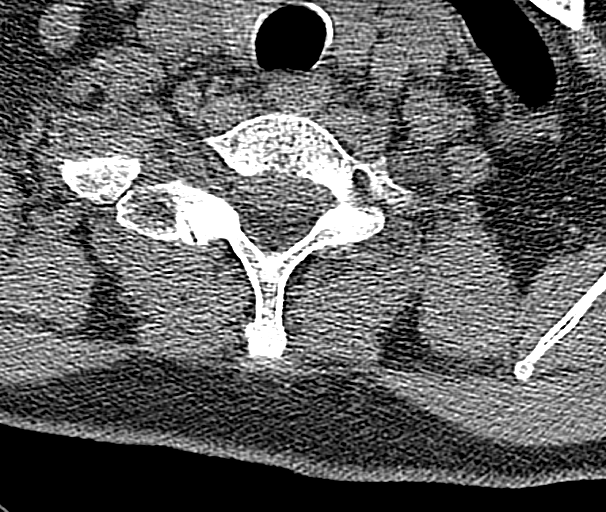
[im 29/87  bone]
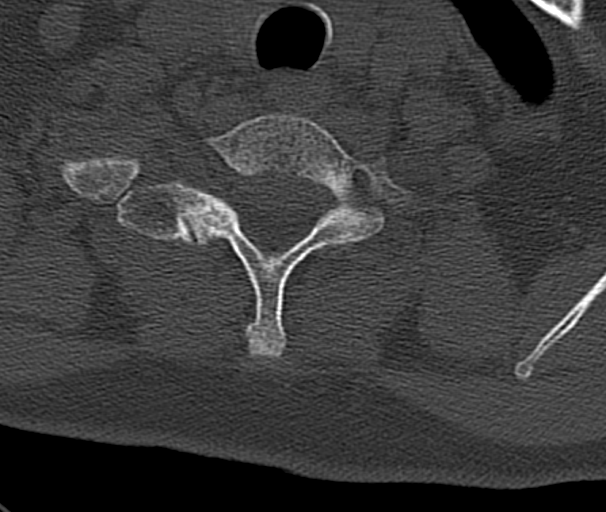
[im 58/87  bone]
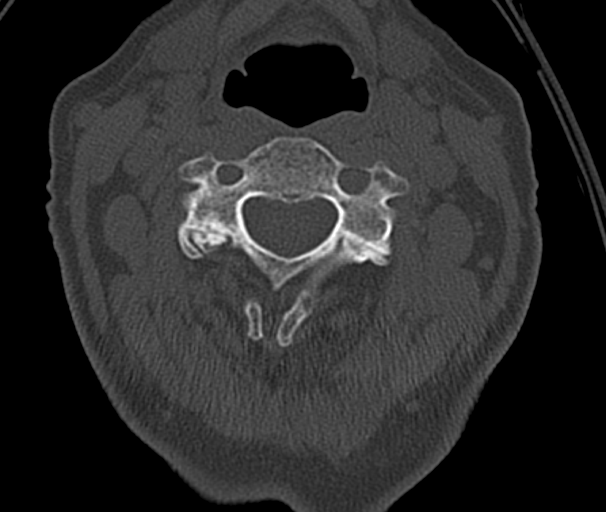

[Series 13: sagittal bone · sagittal · 0.23mm/px · 1 of 72 slices shown]
[im 36/72  bone]
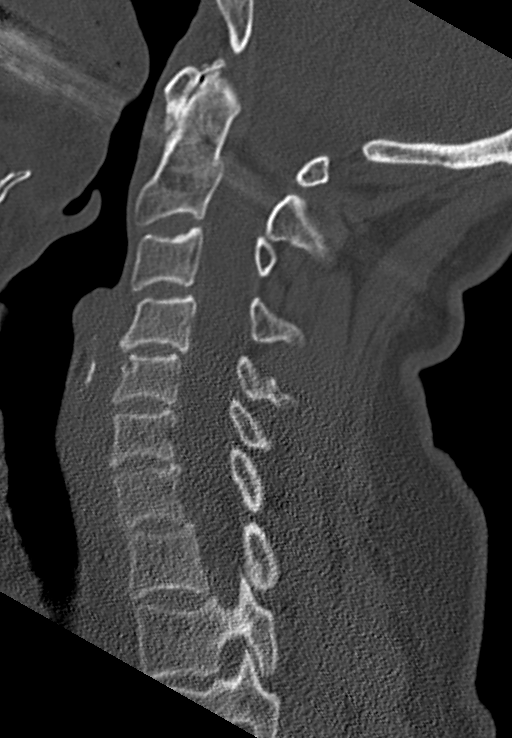

[Series 14: max soft · axial · 0.39mm/px · z∈[+1451,+1513]mm · 2 of 95 slices shown]
[im 32/95  soft-tissue]
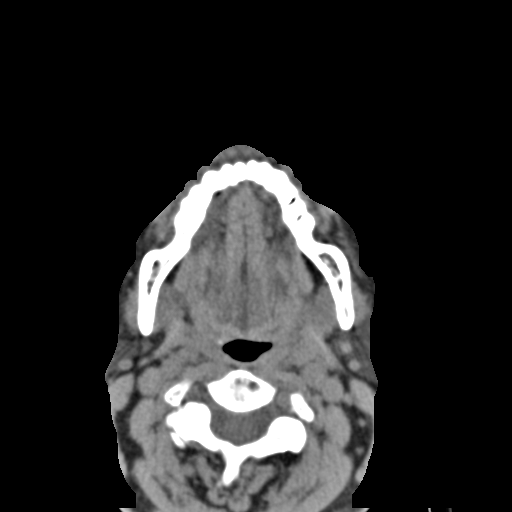
[im 63/95  soft-tissue]
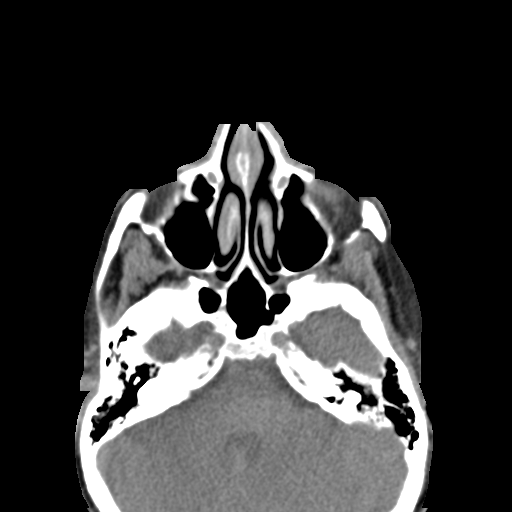

[7 of 33 positions shown; findings below may reference images not displayed]

FINDINGS: CT HEAD FINDINGS

Brain: No evidence of acute infarction, hemorrhage, hydrocephalus,
extra-axial collection or mass lesion/mass effect. There is mild
cerebral volume loss with associated ex vacuo dilatation.

Vascular: No hyperdense vessel or unexpected calcification.

Skull: Normal. Negative for fracture or focal lesion.

Other: None.

CT MAXILLOFACIAL FINDINGS

Osseous: No fracture or mandibular dislocation. No destructive
process.

Orbits: Negative. No traumatic or inflammatory finding.

Sinuses: Clear.

Soft tissues: There is soft tissue swelling overlying the left
forehead and periorbital region.

CT CERVICAL SPINE FINDINGS

Alignment: Normal.

Skull base and vertebrae: No acute fracture. No primary bone lesion
or focal pathologic process.

Soft tissues and spinal canal: No prevertebral fluid or swelling. No
visible canal hematoma.

Disc levels: There are varying degrees of degenerative disc and
joint disease.

Upper chest: Negative.

Other: None
IMPRESSION: 1. No acute intracranial process.
2. No acute facial fracture.
3. No acute osseous injury in the cervical spine.

## 2019-01-08 IMAGING — CR DG WRIST COMPLETE 3+V*L*
4 series · 4 of 4 positions shown · non-contrast
Comparison: None.

CLINICAL DATA: Fall, wrist deformity

EXAM:
LEFT WRIST - COMPLETE 3+ VIEW

[x wrist pa left]
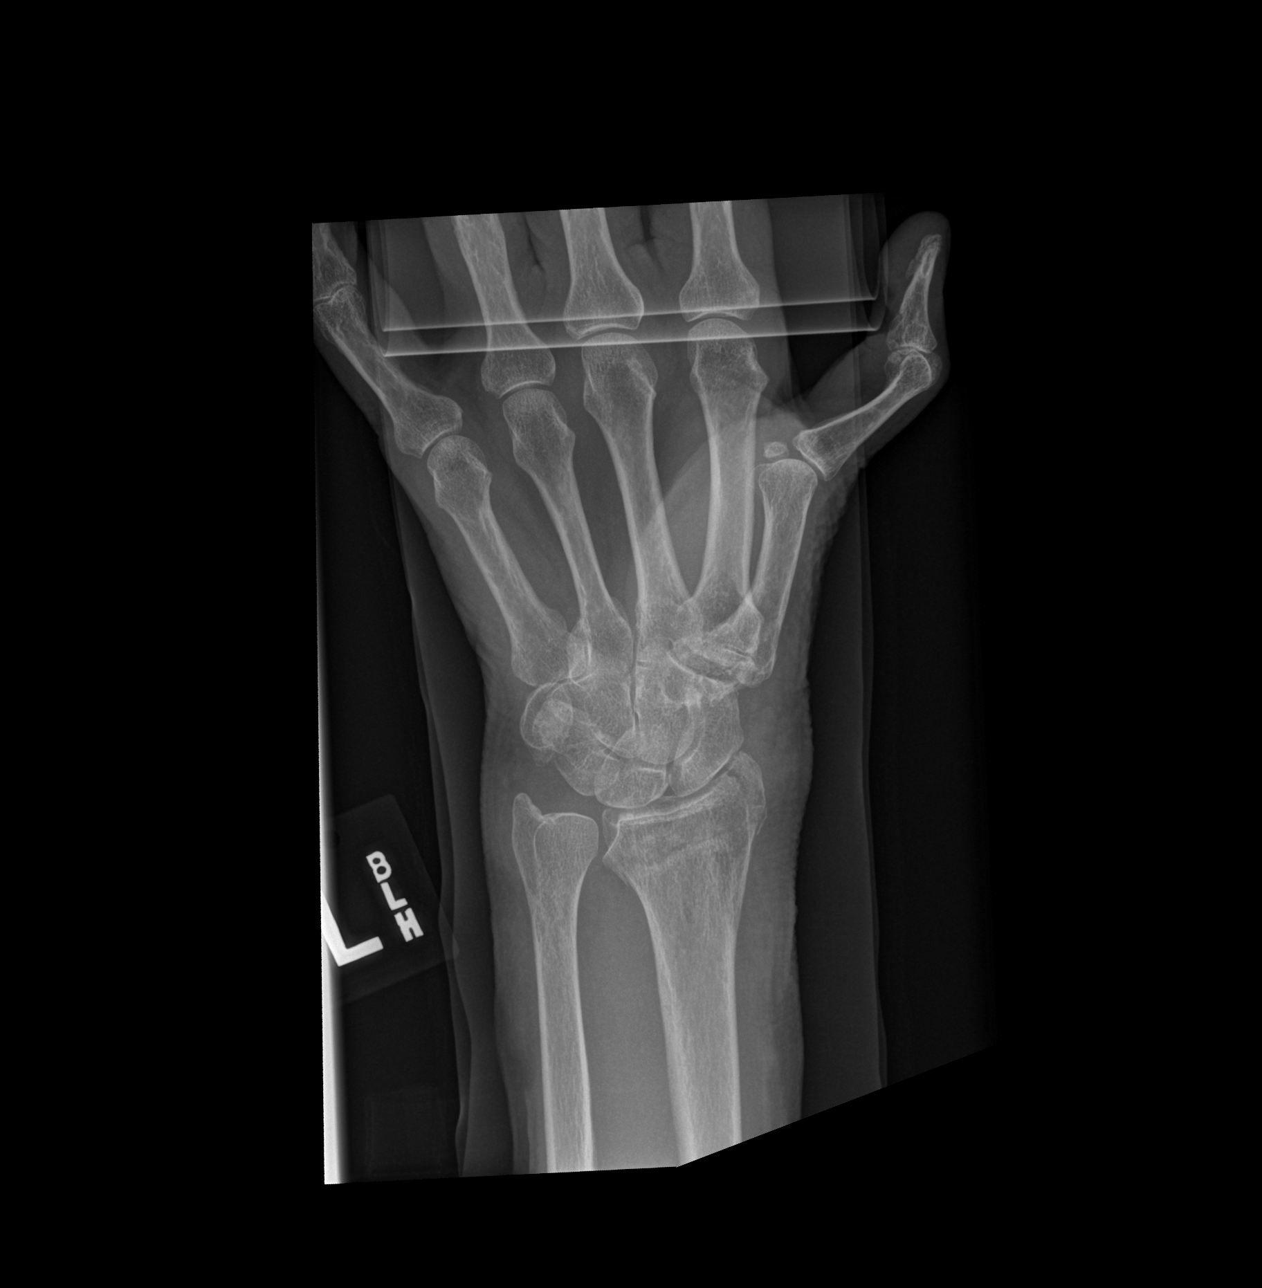

[x wrist obl left]
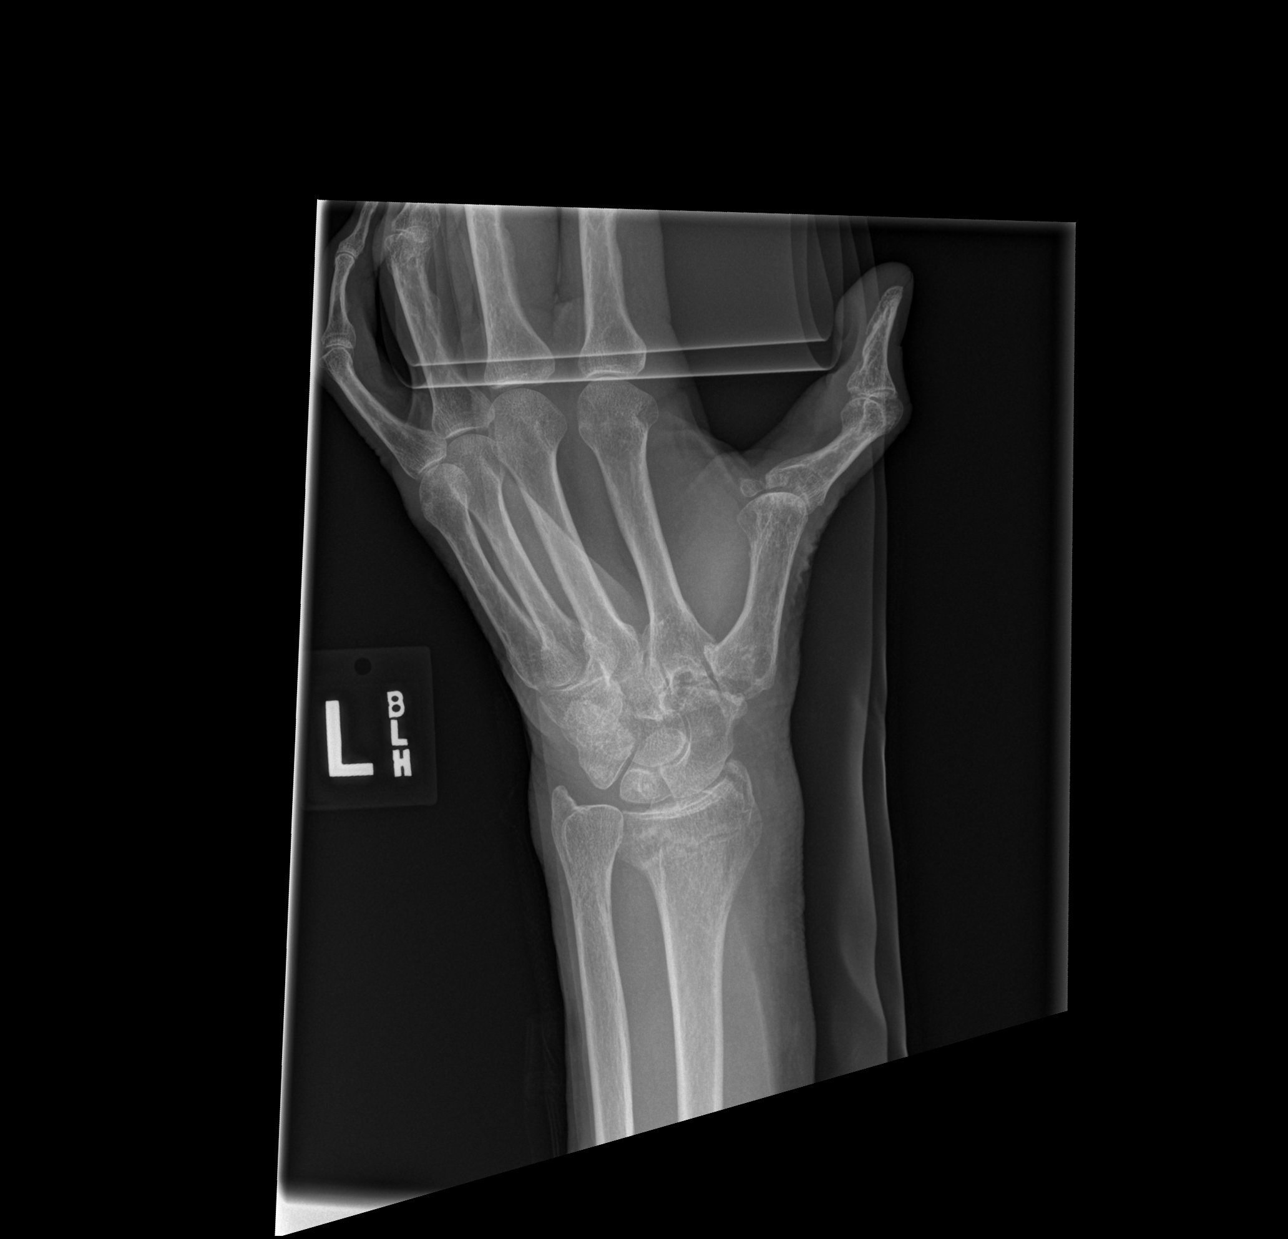

[x wrist lat left]
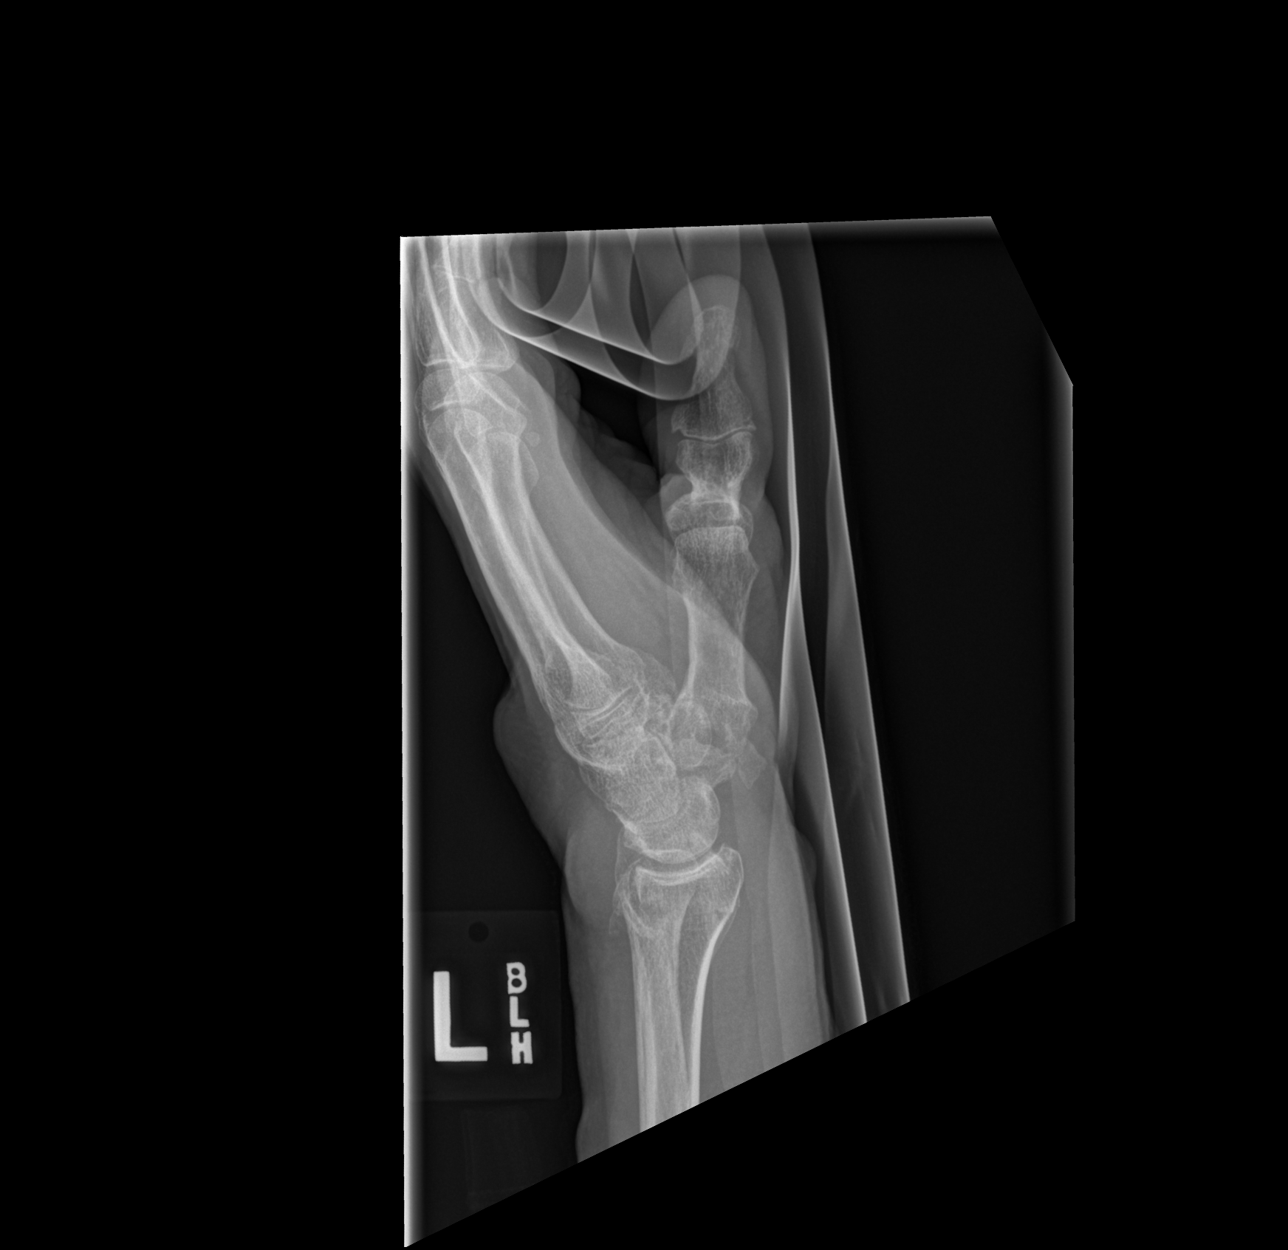

[x wrist navicular view left]
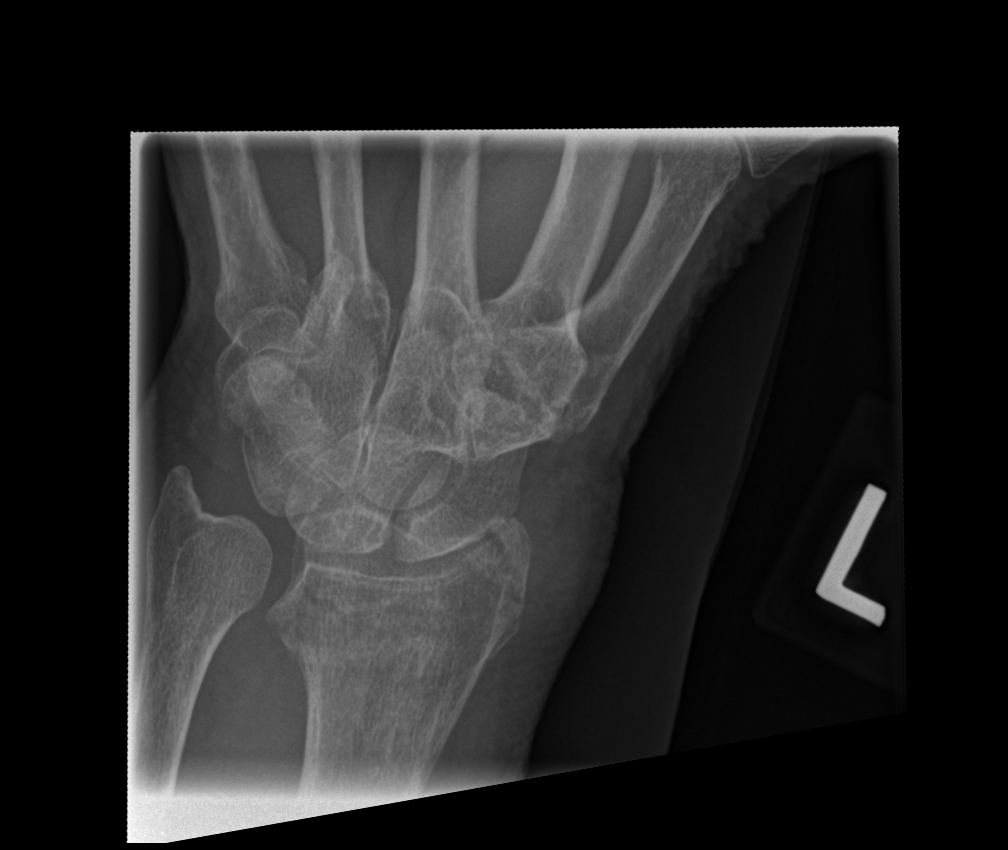

[4 of 4 positions shown; findings below may reference images not displayed]

FINDINGS: Impacted foreshortening of the distal radius with comminuted
intra-articular fracture mainly over the radial side with respect to
extension into the joint. There is soft tissue swelling about the
wrist. Carpometacarpal degenerative changes are present. Osteopenia
limits assessment.

There is an irregular sclerotic appearing bone fragment seen on the
lateral view without clear donor site. There is overlap of the first
carpometacarpal joint with nonvisualization of trapezium and
trapezoid.
IMPRESSION: 1. Impacted foreshortened comminuted fracture of the distal radius.
2. Irregular sclerotic appearing bone fragment seen on the lateral
view without clear donor site. This could be related to prior trauma
or fracture in the distal carpal row about the base of the first and
second metacarpal. Suggest CT assessment for further evaluation. No
priors are available for comparison.

## 2019-01-08 IMAGING — CT CT WRIST*L* W/O CM
3 series · 13 of 34 positions shown, 16 images · non-contrast
Comparison: Radiographs, same date.

CLINICAL DATA: Evaluate left wrist fracture.

EXAM:
CT OF THE LEFT WRIST WITHOUT CONTRAST
TECHNIQUE: Multidetector CT imaging was performed according to the standard
protocol. Multiplanar CT image reconstructions were also generated.

[Series 2: axial st · axial · 0.28mm/px · z∈[+1516,+1588]mm · 5 of 54 slices shown, 7 images]
[im 9/54  soft-tissue]
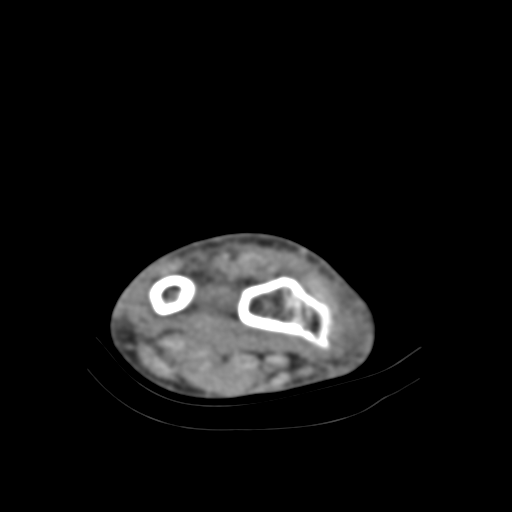
[im 9/54  bone]
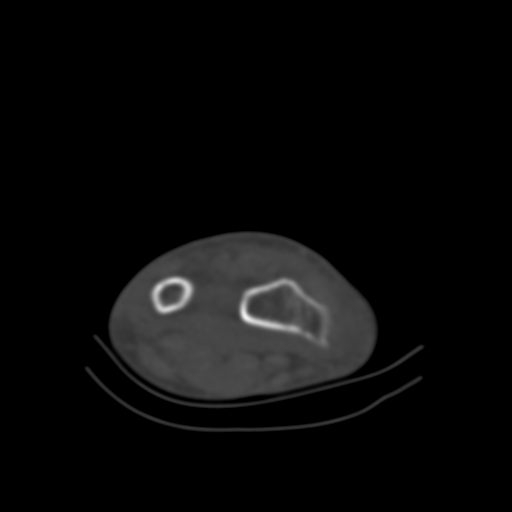
[im 17/54  bone]
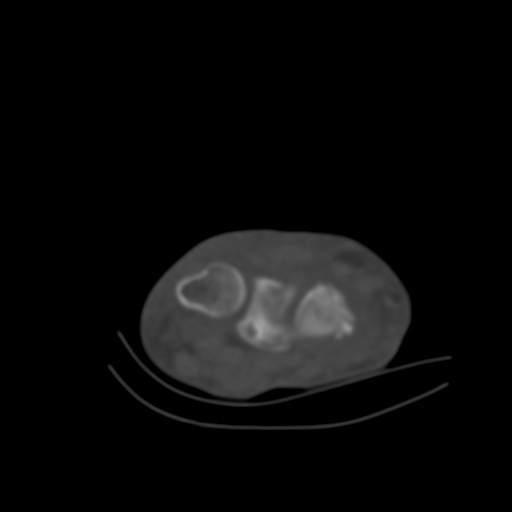
[im 29/54  bone]
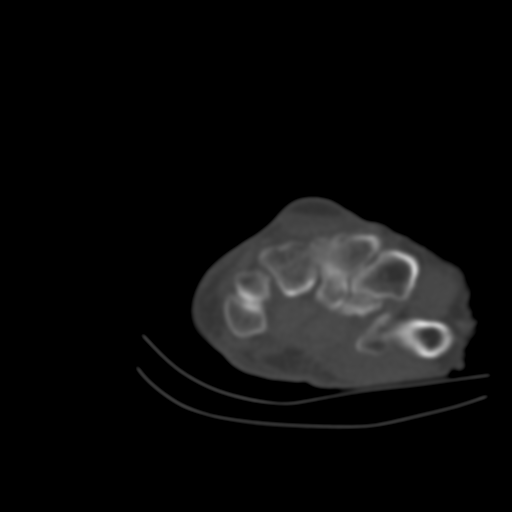
[im 37/54  bone]
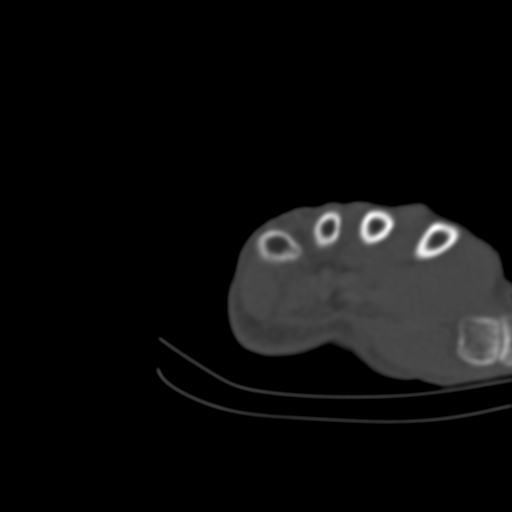
[im 45/54  soft-tissue]
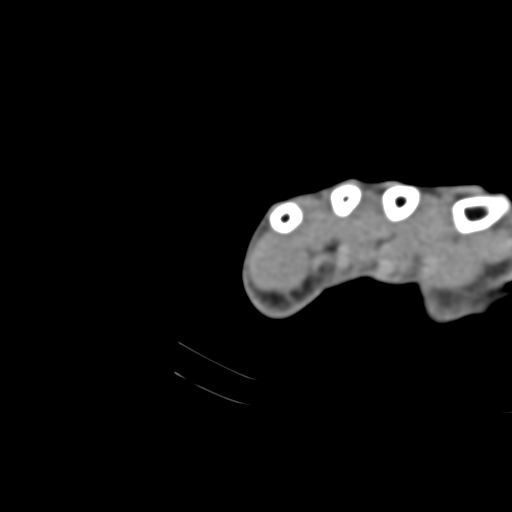
[im 45/54  bone]
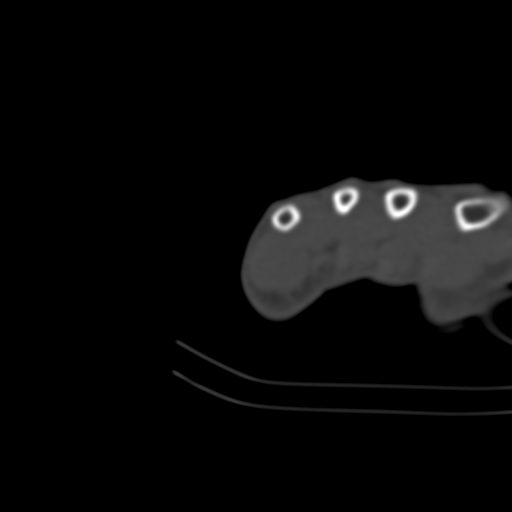

[Series 8: cor st · coronal · 0.24mm/px · 3 of 62 slices shown]
[im 13/62  bone]
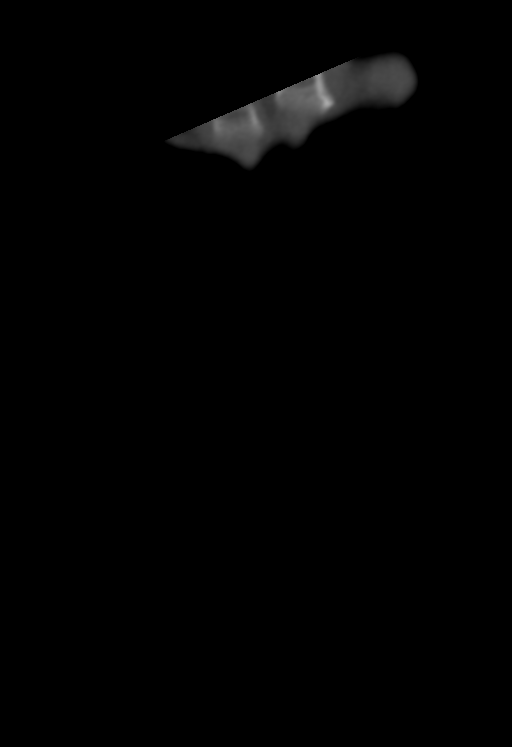
[im 25/62  bone]
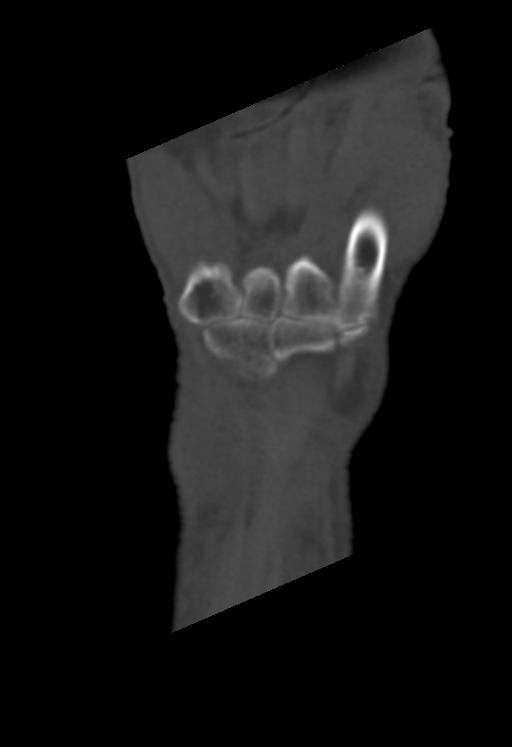
[im 37/62  bone]
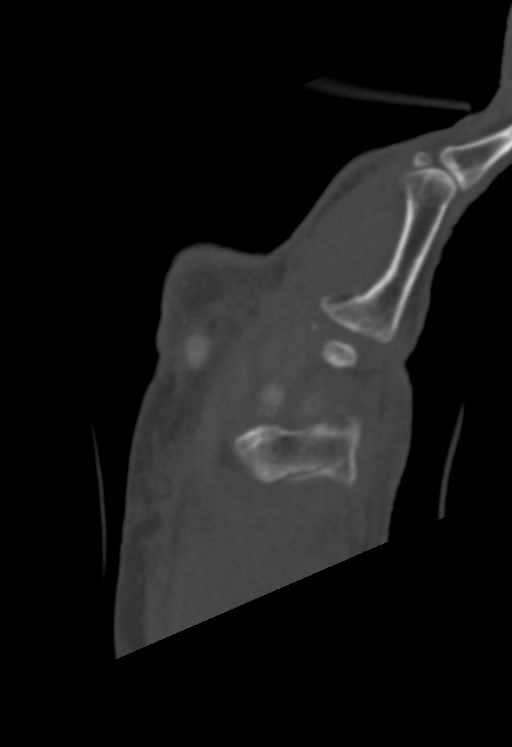

[Series 9: sag st · sagittal · 0.24mm/px · 5 of 62 slices shown, 6 images]
[im 21/62  bone]
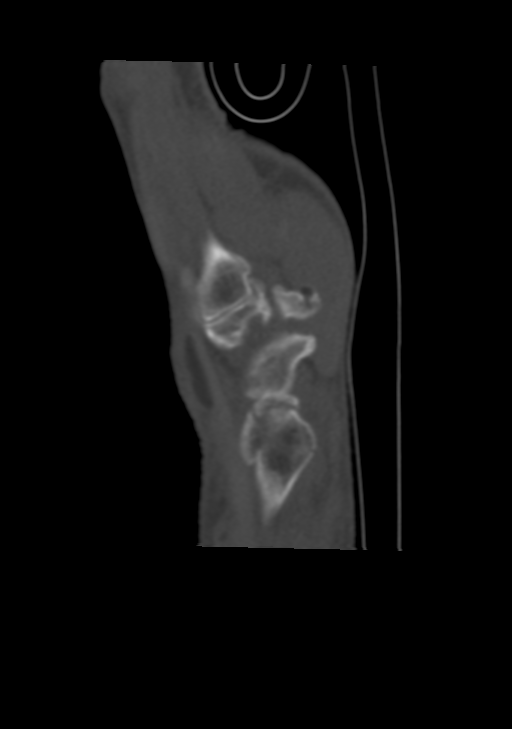
[im 26/62  bone]
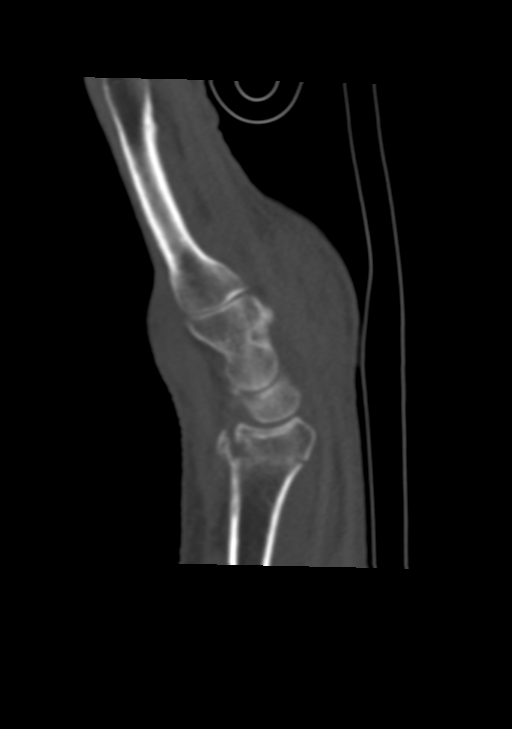
[im 31/62  soft-tissue]
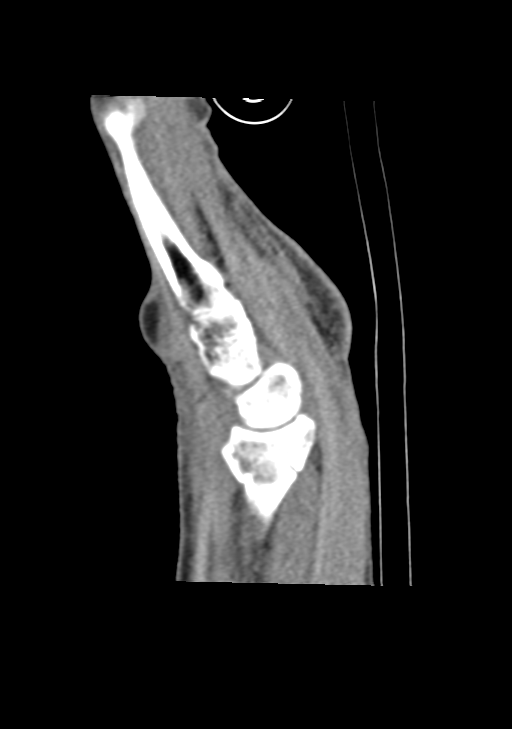
[im 31/62  bone]
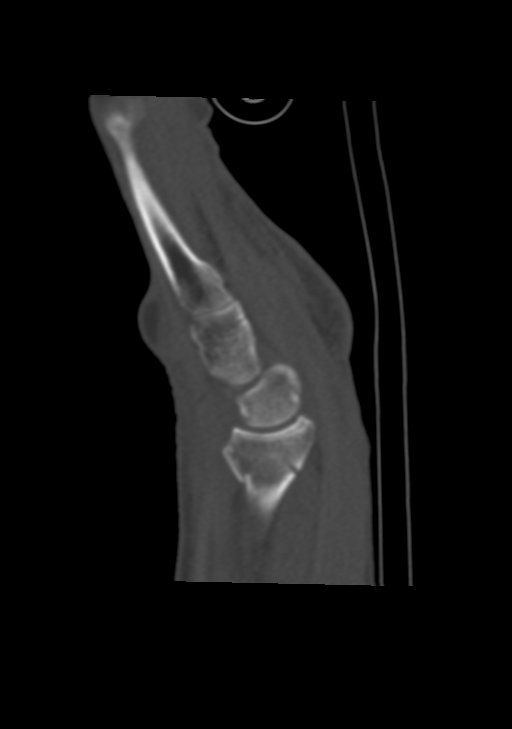
[im 36/62  bone]
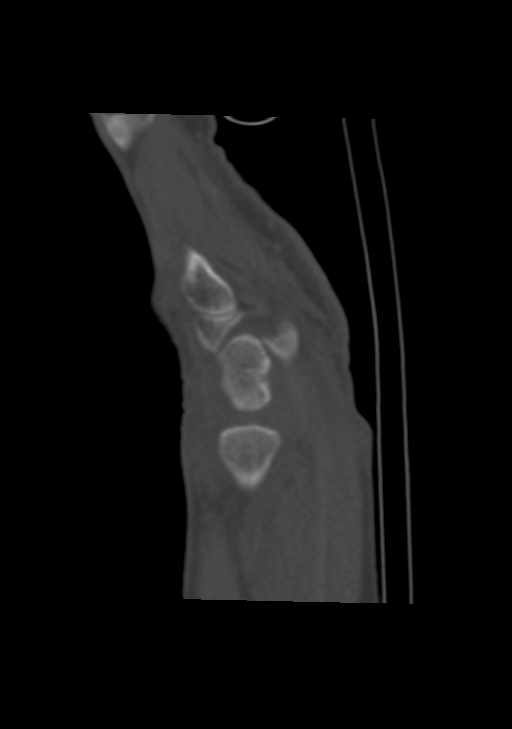
[im 41/62  bone]
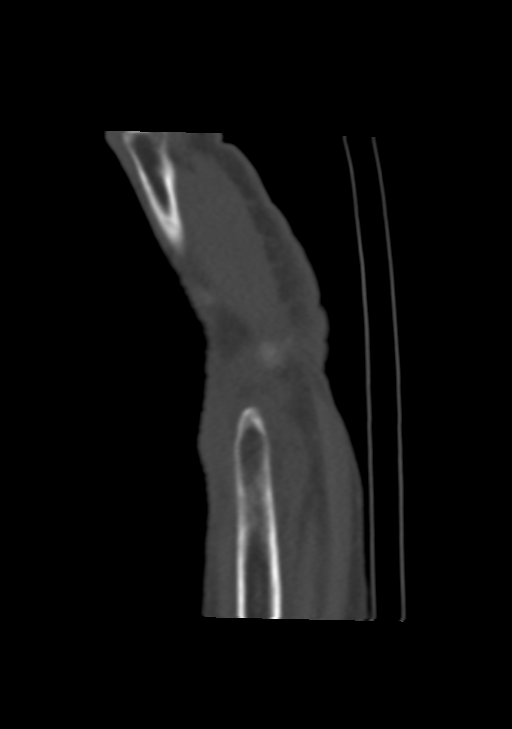

[13 of 34 positions shown; findings below may reference images not displayed]

FINDINGS: Complex comminuted intra-articular fracture of the distal radius
with mild dorsal impaction and involvement of the radial styloid. No
significant displacement.

No associated ulnar styloid avulsion fracture.

The carpal bones are intact. The prior resection of the trapezium
and partial resection of the trapezoid. Cavity in the base of the
first metacarpal likely from prior arthroplasty. No acute fracture
involving the carpal bones or metacarpal bones.

Moderate radiocarpal joint degenerative changes with joint space
narrowing and subchondral cystic change.
IMPRESSION: 1. Complex comminuted intra-articular fracture of the distal radius
with mild dorsal impaction.
2. No distal ulnar fracture.
3. Remote surgical changes involving the base of the first
metacarpal and adjacent carpal bones.

## 2019-01-08 IMAGING — CT CT CHEST W/O CM
2 of 3 series · 15 of 36 positions shown, 18 images · non-contrast
Comparison: Chest CT report from [7D].

CLINICAL DATA: Fall, blunt trauma to the chest while walking the
dog in the park.

EXAM:
CT CHEST WITHOUT CONTRAST
TECHNIQUE: Multidetector CT imaging of the chest was performed following the
standard protocol without IV contrast.

[Series 2: thorax · axial · 0.68mm/px · z∈[+1110,+1378]mm · 12 of 158 slices shown, 15 images]
[im 12/158  mediastinal]
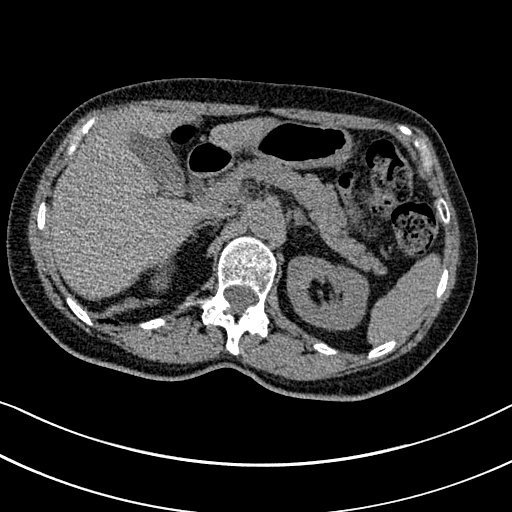
[im 12/158  lung]
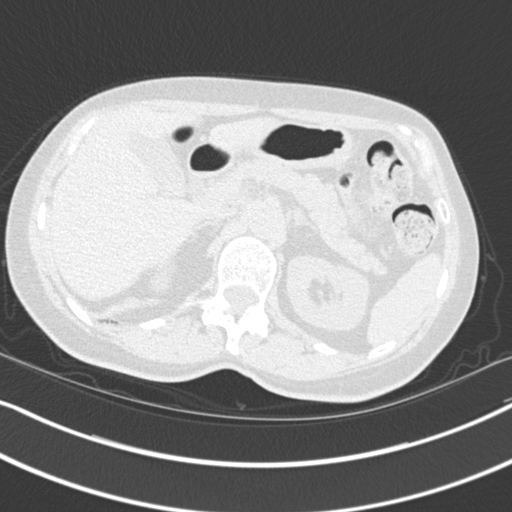
[im 24/158  lung]
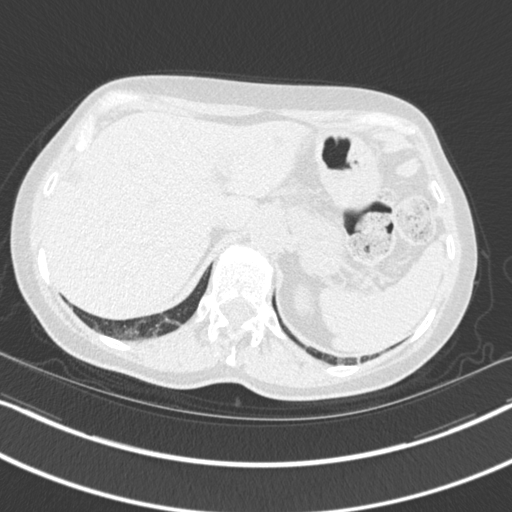
[im 35/158  lung]
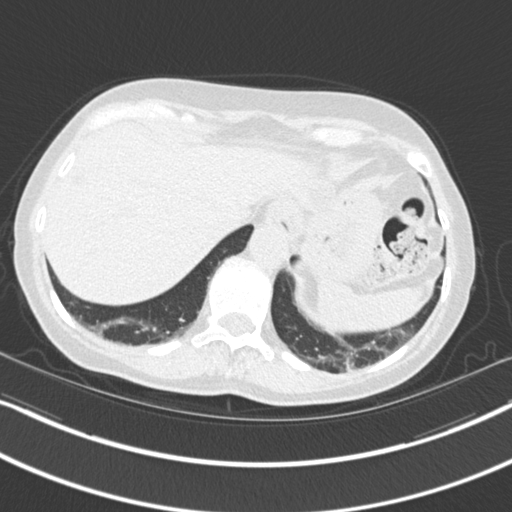
[im 47/158  lung]
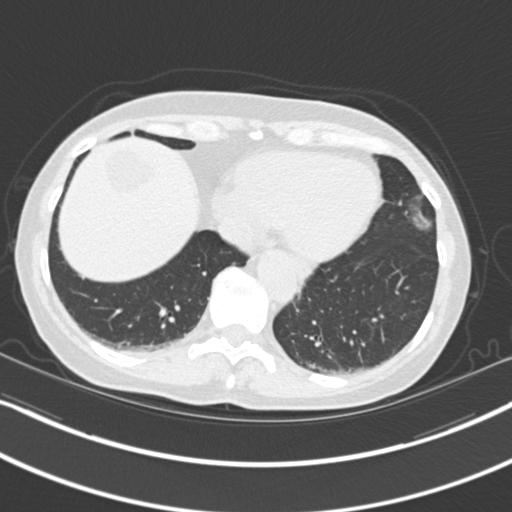
[im 59/158  mediastinal]
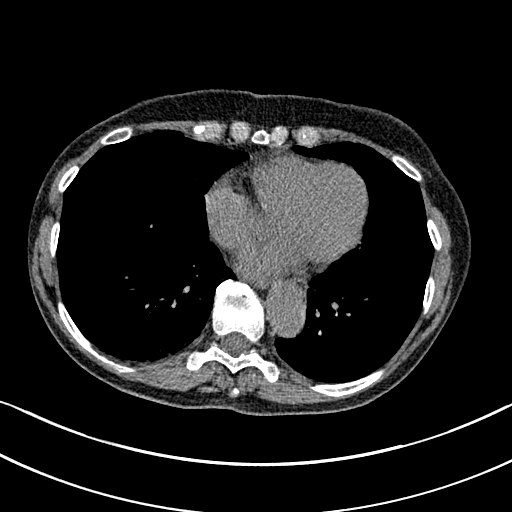
[im 59/158  lung]
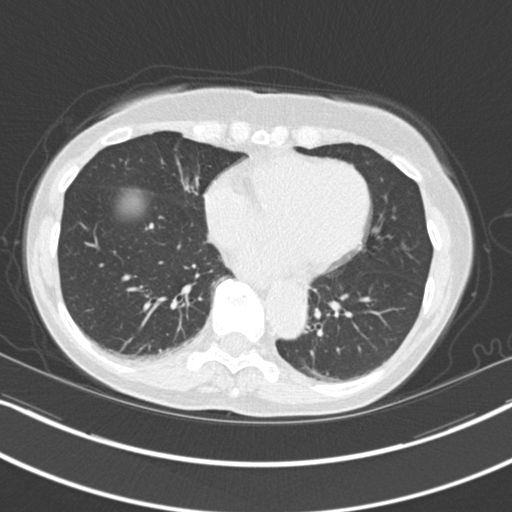
[im 70/158  lung]
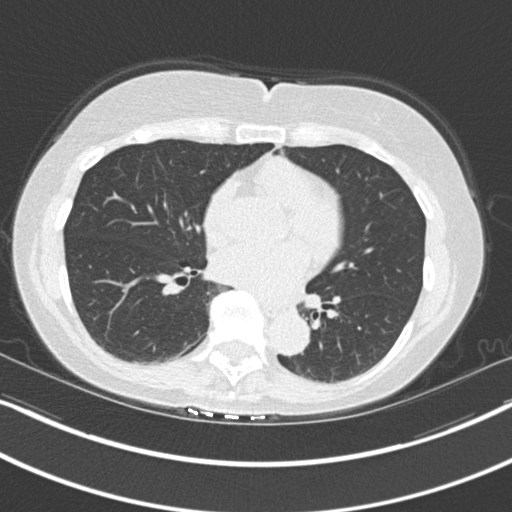
[im 88/158  lung]
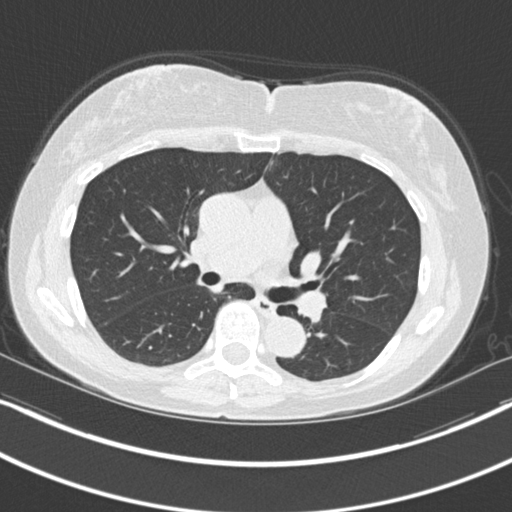
[im 99/158  lung]
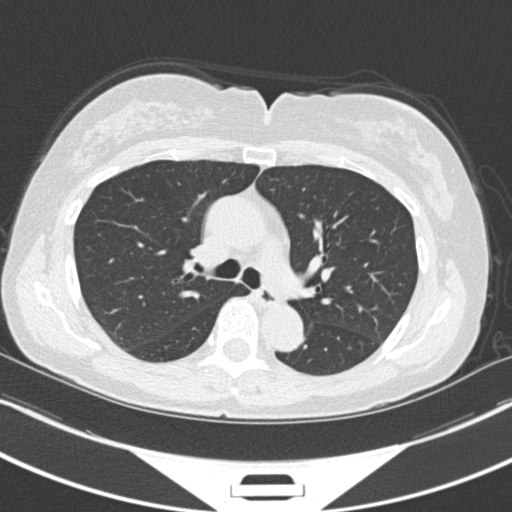
[im 111/158  mediastinal]
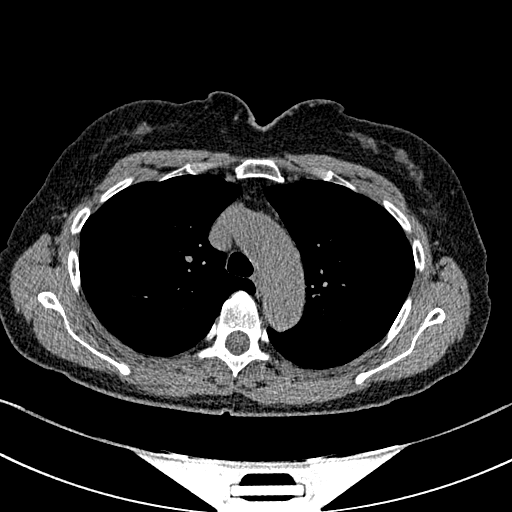
[im 111/158  lung]
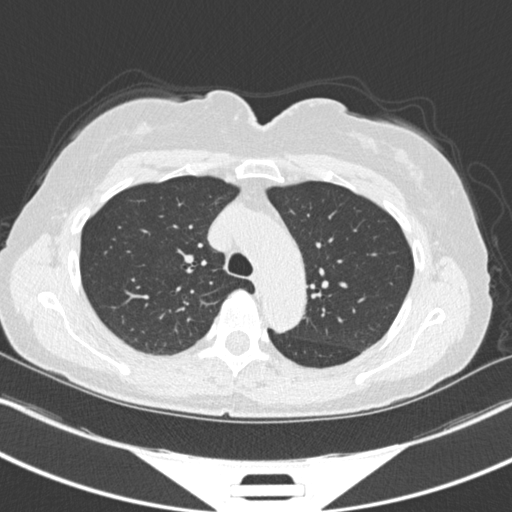
[im 123/158  lung]
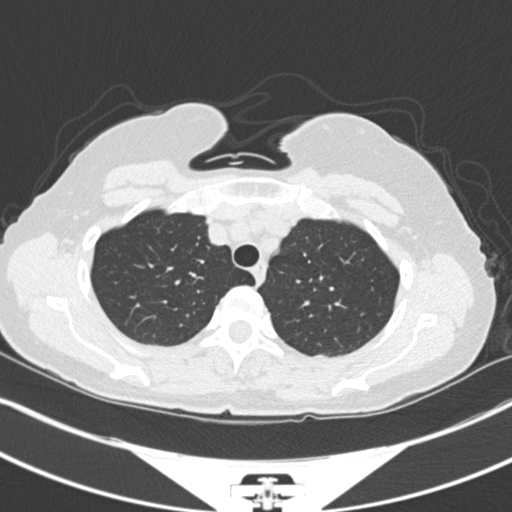
[im 134/158  lung]
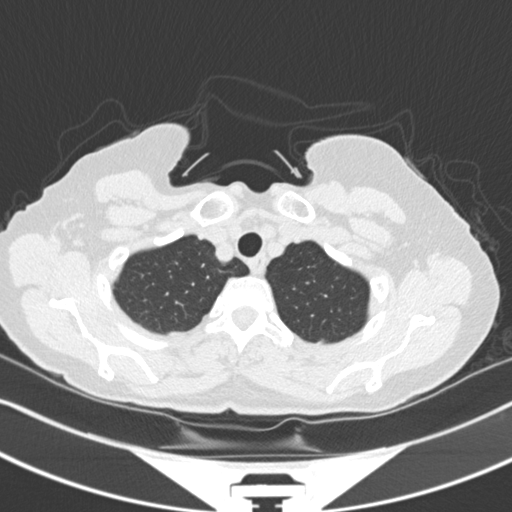
[im 146/158  lung]
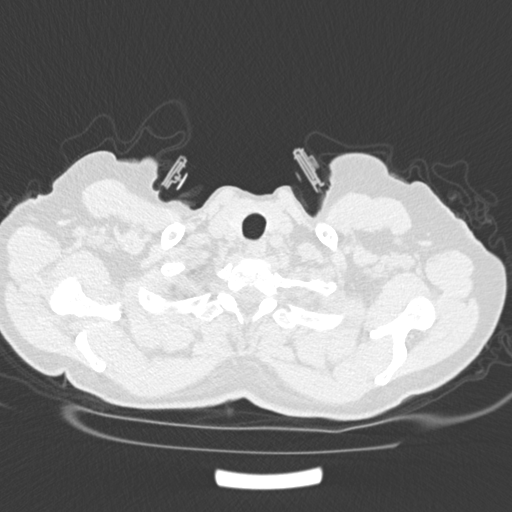

[Series 5: coronal · coronal · 0.66mm/px · 3 of 149 slices shown]
[im 30/149  lung]
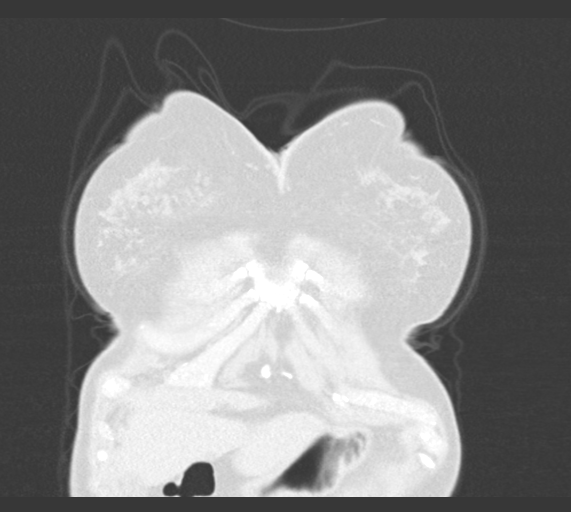
[im 60/149  lung]
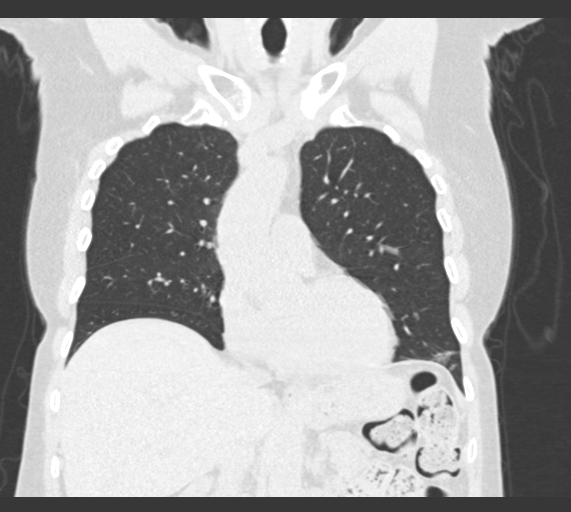
[im 89/149  lung]
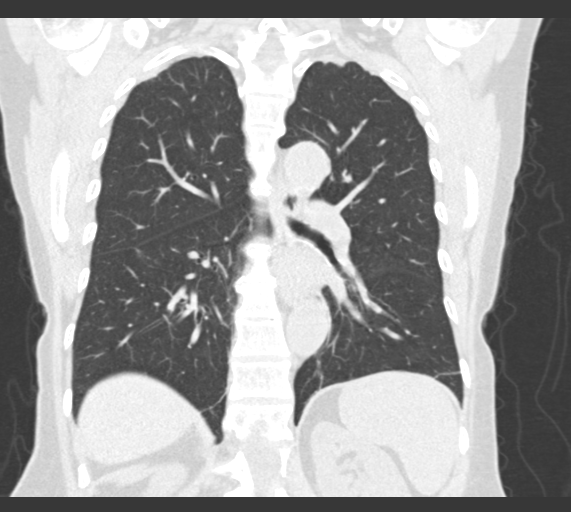

[15 of 36 positions shown; findings below may reference images not displayed]

FINDINGS: Cardiovascular: Scattered atherosclerosis. No signs of aneurysm.
Limited assessment of vascular structures without contrast. Heart
size normal without pericardial effusion. Central pulmonary
vasculature of normal caliber.

Mediastinum/Nodes: No enlarged mediastinal or axillary lymph nodes.
Thyroid gland, trachea, and esophagus demonstrate no significant
findings.

Lungs/Pleura: Signs of lingular scarring. Also right middle lobe
mild scarring. Basilar atelectasis. Branching nodularity at the
right lung base best seen on image 98 of series 3 no signs of
pleural effusion or pneumothorax. Airways are patent.

Upper Abdomen: Multiple areas of low attenuation in the liver,
reportedly areas were present in [7D] with similar appearance. These
are well-circumscribed and water density likely cysts. Lesion near
the dome is slightly more dense than other areas with lobular
contours measuring 3.6 x 2 point 9 cm. Still with main attenuation
values only slightly greater than 20 Hounsfield units.

Musculoskeletal: No sign of acute bone finding or evidence of
destructive bone process. Sternum is intact. Bilateral visualized
clavicles and scapulae while incompletely imaged are normal within
imaged portions period
IMPRESSION: 1. No acute intrathoracic findings.
2. Lingular and middle lobe scarring may be sequela NOLVIA infection,
also associated with small area of branching nodularity and
tree-in-bud in the anterior right lower lobe, no signs of
consolidation or pleural effusion.
3. Multiple areas of low attenuation in the liver, some of which are
well-circumscribed and water density likely represent cysts, some
are slightly more dense than other areas of lobular contours. The
largest lesion near the dome is slightly more dense than other areas
of low attenuation, and may represent a mildly complex cyst or
hemangioma. Consider further assessment with ultrasound on
follow-up.
4. Aortic atherosclerosis.
5. These results were called by telephone at the time of
interpretation on [DATE] at [DATE] to provider NOLVIA ,
who verbally acknowledged these results.

Aortic Atherosclerosis ([7D]-[7D]).

## 2019-01-08 MED ORDER — MORPHINE SULFATE (PF) 4 MG/ML IV SOLN
4.0000 mg | Freq: Once | INTRAVENOUS | Status: AC
  Administered 2019-01-08: 4 mg via INTRAVENOUS
  Filled 2019-01-08: qty 1

## 2019-01-08 MED ORDER — HYDROCODONE-ACETAMINOPHEN 5-325 MG PO TABS: 1.0000 | ORAL_TABLET | Freq: Four times a day (QID) | ORAL | 0 refills | Status: DC | PRN

## 2019-01-08 MED ORDER — TETANUS-DIPHTH-ACELL PERTUSSIS 5-2.5-18.5 LF-MCG/0.5 IM SUSP
0.5000 mL | Freq: Once | INTRAMUSCULAR | Status: AC
  Administered 2019-01-08: 0.5 mL via INTRAMUSCULAR
  Filled 2019-01-08: qty 0.5

## 2019-01-08 MED ORDER — HYDROCODONE-ACETAMINOPHEN 5-325 MG PO TABS
1.0000 | ORAL_TABLET | Freq: Once | ORAL | Status: AC
  Administered 2019-01-08: 1 via ORAL
  Filled 2019-01-08: qty 1

## 2019-01-08 NOTE — Progress Notes (Signed)
Called and reviewed films,  Neuro intact.  Distal radius fracture with displacement, question carpal abnormality.  Looks like chronic degenerative CMC changes, but reasonable to get a CT to make sure nothing else involved.    Sugar tong splint and f/u w me next week.   Johnny Bridge, MD

## 2019-01-08 NOTE — ED Provider Notes (Signed)
[name] Provider Note   CSN: [id number] Arrival date & time: 01/08/19  0910     History   Chief Complaint Chief Complaint  Patient presents with   Fall    lacerations, wrist deformity    HPI Allison Harris is a 73 y.o. female.  She said she was walking her dog today at the park when the dog quickly pull another direction and she was pulled to the ground.  She landed on the asphalt.  Hit her head her face left chest wall and left wrist deformity.  Complaining of moderate pain.  Worse with movement.  Last tetanus shot unknown.     The history is provided by the patient.  Fall This is a new problem. The current episode started 1 to 2 hours ago. The problem occurs constantly. The problem has not changed since onset.Associated symptoms include chest pain and headaches. Pertinent negatives include no abdominal pain and no shortness of breath. The symptoms are aggravated by bending and twisting. Nothing relieves the symptoms. She has tried nothing for the symptoms. The treatment provided no relief.    History reviewed. No pertinent past medical history.  There are no active problems to display for this patient.   History reviewed. No pertinent surgical history.   OB History   No obstetric history on file.      Home Medications    Prior to Admission medications   Medication Sig Start Date End Date Taking? Authorizing Provider  lisinopril-hydrochlorothiazide (ZESTORETIC) 10-12.5 MG tablet Take 1 tablet by mouth daily. 08/16/15  Yes [provider]  Multiple Vitamin (MULTIVITAMIN) tablet Take 1 tablet by mouth daily.   Yes [provider]  SYNTHROID 25 MCG tablet Take 25 mcg by mouth every morning. 12/15/18  Yes [provider]  Thiamine HCl (VITAMIN B-1 PO) Take 1 tablet by mouth daily.   Yes [provider]  VITAMIN D PO Take 1 capsule by mouth daily.   Yes [provider]    Family  History Family History  Problem Relation Age of Onset   Breast cancer Neg Hx     Social History Social History   Tobacco Use   Smoking status: Not on file  Substance Use Topics   Alcohol use: Not on file   Drug use: Not on file     Allergies   Patient has no known allergies.   Review of Systems Review of Systems  Constitutional: Negative for fever.  HENT: Positive for facial swelling. Negative for sore throat.   Eyes: Negative for visual disturbance.  Respiratory: Negative for shortness of breath.   Cardiovascular: Positive for chest pain.  Gastrointestinal: Negative for abdominal pain.  Genitourinary: Negative for dysuria.  Skin: Positive for wound. Negative for rash.  Neurological: Positive for headaches.     Physical Exam Updated Vital Signs BP (!) 153/83    Pulse (!) 59    Temp 98 F (36.7 C) (Oral)    Resp 14    Ht  (1.702 m)    Wt 63.5 kg    SpO2 99%    BMI 21.93 kg/m   Physical Exam Vitals signs and nursing note reviewed.  Constitutional:      General: She is not in acute distress.    Appearance: She is well-developed.  HENT:     Head: Normocephalic.     Comments: She has forehead contusion.  Some bruising around her left eye. Lip laceration upper lip approximately 1 cm. Not  crossing vermillion Eyes:     Extraocular Movements: Extraocular movements intact.     Conjunctiva/sclera: Conjunctivae normal.     Pupils: Pupils are equal, round, and reactive to light.  Neck:     Musculoskeletal: Neck supple.  Cardiovascular:     Rate and Rhythm: Normal rate and regular rhythm.     Heart sounds: No murmur.  Pulmonary:     Effort: Pulmonary effort is normal. No respiratory distress.     Breath sounds: Normal breath sounds.  Abdominal:     Palpations: Abdomen is soft.     Tenderness: There is no abdominal tenderness.  Musculoskeletal:        General: Tenderness, deformity and signs of injury present.     Comments: Left wrist deformity no open  wounds.  Distal pulses sensation motor intact.  Other extremities full range of motion without any pain or limitations.  Skin:    General: Skin is warm and dry.     Capillary Refill: Capillary refill takes less than 2 seconds.  Neurological:     General: No focal deficit present.     Mental Status: She is alert and oriented to person, place, and time.     Sensory: No sensory deficit.     Motor: No weakness.      ED Treatments / Results  Labs (all labs ordered are listed, but only abnormal results are displayed) Labs Reviewed - No data to display  EKG None  Radiology Dg Wrist Complete Left  Result Date: 01/08/2019 CLINICAL DATA:  Fall, wrist deformity EXAM: LEFT WRIST - COMPLETE 3+ VIEW COMPARISON:  None. FINDINGS: Impacted foreshortening of the distal radius with comminuted intra-articular fracture mainly over the radial side with respect to extension into the joint. There is soft tissue swelling about the wrist. Carpometacarpal degenerative changes are present. Osteopenia limits assessment. There is an irregular sclerotic appearing bone fragment seen on the lateral view without clear donor site. There is overlap of the first carpometacarpal joint with nonvisualization of trapezium and trapezoid. IMPRESSION: 1. Impacted foreshortened comminuted fracture of the distal radius. 2. Irregular sclerotic appearing bone fragment seen on the lateral view without clear donor site. This could be related to prior trauma or fracture in the distal carpal row about the base of the first and second metacarpal. Suggest CT assessment for further evaluation. No priors are available for comparison. Electronically Signed   By: Donzetta Kohut M.D.   On: 01/08/2019 12:27   Ct Head Wo Contrast  Result Date: 01/08/2019 CLINICAL DATA:  Head, face, and neck trauma. EXAM: CT HEAD WITHOUT CONTRAST CT MAXILLOFACIAL WITHOUT CONTRAST CT CERVICAL SPINE WITHOUT CONTRAST TECHNIQUE: Multidetector CT imaging of the head,  cervical spine, and maxillofacial structures were performed using the standard protocol without intravenous contrast. Multiplanar CT image reconstructions of the cervical spine and maxillofacial structures were also generated. COMPARISON:  Report from a CT head dated 08/14/2002. FINDINGS: CT HEAD FINDINGS Brain: No evidence of acute infarction, hemorrhage, hydrocephalus, extra-axial collection or mass lesion/mass effect. There is mild cerebral volume loss with associated ex vacuo dilatation. Vascular: No hyperdense vessel or unexpected calcification. Skull: Normal. Negative for fracture or focal lesion. Other: None. CT MAXILLOFACIAL FINDINGS Osseous: No fracture or mandibular dislocation. No destructive process. Orbits: Negative. No traumatic or inflammatory finding. Sinuses: Clear. Soft tissues: There is soft tissue swelling overlying the left forehead and periorbital region. CT CERVICAL SPINE FINDINGS Alignment: Normal. Skull base and vertebrae: No acute fracture. No primary bone lesion or focal pathologic process.  Soft tissues and spinal canal: No prevertebral fluid or swelling. No visible canal hematoma. Disc levels: There are varying degrees of degenerative disc and joint disease. Upper chest: Negative. Other: None IMPRESSION: 1. No acute intracranial process. 2. No acute facial fracture. 3. No acute osseous injury in the cervical spine. Electronically Signed   By: Romona Curls M.D.   On: 01/08/2019 13:01   Ct Chest Wo Contrast  Result Date: 01/08/2019 CLINICAL DATA:  Fall, blunt trauma to the chest while walking the dog in the park. EXAM: CT CHEST WITHOUT CONTRAST TECHNIQUE: Multidetector CT imaging of the chest was performed following the standard protocol without IV contrast. COMPARISON:  Chest CT report from 2004. FINDINGS: Cardiovascular: Scattered atherosclerosis. No signs of aneurysm. Limited assessment of vascular structures without contrast. Heart size normal without pericardial effusion. Central  pulmonary vasculature of normal caliber. Mediastinum/Nodes: No enlarged mediastinal or axillary lymph nodes. Thyroid gland, trachea, and esophagus demonstrate no significant findings. Lungs/Pleura: Signs of lingular scarring. Also right middle lobe mild scarring. Basilar atelectasis. Branching nodularity at the right lung base best seen on image 98 of series 3 no signs of pleural effusion or pneumothorax. Airways are patent. Upper Abdomen: Multiple areas of low attenuation in the liver, reportedly areas were present in 2004 with similar appearance. These are well-circumscribed and water density likely cysts. Lesion near the dome is slightly more dense than other areas with lobular contours measuring 3.6 x 2 point 9 cm. Still with main attenuation values only slightly greater than 20 Hounsfield units. Musculoskeletal: No sign of acute bone finding or evidence of destructive bone process. Sternum is intact. Bilateral visualized clavicles and scapulae while incompletely imaged are normal within imaged portions period IMPRESSION: 1. No acute intrathoracic findings. 2. 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 3. Multiple areas of low attenuation in the liver, some of which are well-circumscribed and water density likely represent cysts, some are slightly more dense than other areas of lobular contours. The largest lesion near the dome is slightly more dense than other areas of low attenuation, and may represent a mildly complex cyst or hemangioma. Consider further assessment with ultrasound on follow-up. 4. Aortic atherosclerosis. 5. These results were called by telephone at the time of interpretation on 01/08/2019 at 12:45 pm to provider [facility] , who verbally acknowledged these results. Aortic Atherosclerosis (ICD10-I70.0). Electronically  Signed   By: Donzetta Kohut M.D.   On: 01/08/2019 12:43   Ct Cervical Spine Wo Contrast  Result Date: 01/08/2019 CLINICAL DATA:  Head, face, and neck trauma. EXAM: CT HEAD WITHOUT CONTRAST CT MAXILLOFACIAL WITHOUT CONTRAST CT CERVICAL SPINE WITHOUT CONTRAST TECHNIQUE: Multidetector CT imaging of the head, cervical spine, and maxillofacial structures were performed using the standard protocol without intravenous contrast. Multiplanar CT image reconstructions of the cervical spine and maxillofacial structures were also generated. COMPARISON:  Report from a CT head dated 08/14/2002. FINDINGS: CT HEAD FINDINGS Brain: No evidence of acute infarction, hemorrhage, hydrocephalus, extra-axial collection or mass lesion/mass effect. There is mild cerebral volume loss with associated ex vacuo dilatation. Vascular: No hyperdense vessel or unexpected calcification. Skull: Normal. Negative for fracture or focal lesion. Other: None. CT MAXILLOFACIAL FINDINGS Osseous: No fracture or mandibular dislocation. No destructive process. Orbits: Negative. No traumatic or inflammatory finding. Sinuses: Clear. Soft tissues: There is soft tissue swelling overlying the left forehead and periorbital  region. CT CERVICAL SPINE FINDINGS Alignment: Normal. Skull base and vertebrae: No acute fracture. No primary bone lesion or focal pathologic process. Soft tissues and spinal canal: No prevertebral fluid or swelling. No visible canal hematoma. Disc levels: There are varying degrees of degenerative disc and joint disease. Upper chest: Negative. Other: None IMPRESSION: 1. No acute intracranial process. 2. No acute facial fracture. 3. No acute osseous injury in the cervical spine. Electronically Signed   By: Zerita Boers M.D.   On: 01/08/2019 13:01   Ct Wrist Left Wo Contrast  Result Date: 01/08/2019 CLINICAL DATA:  Evaluate left wrist fracture. EXAM: CT OF THE LEFT WRIST WITHOUT CONTRAST TECHNIQUE: Multidetector CT imaging was performed  according to the standard protocol. Multiplanar CT image reconstructions were also generated. COMPARISON:  Radiographs, same date. FINDINGS: Complex comminuted intra-articular fracture of the distal radius with mild dorsal impaction and involvement of the radial styloid. No significant displacement. No associated ulnar styloid avulsion fracture. The carpal bones are intact. The prior resection of the trapezium and partial resection of the trapezoid. Cavity in the base of the first metacarpal likely from prior arthroplasty. No acute fracture involving the carpal bones or metacarpal bones. Moderate radiocarpal joint degenerative changes with joint space narrowing and subchondral cystic change. IMPRESSION: 1. Complex comminuted intra-articular fracture of the distal radius with mild dorsal impaction. 2. No distal ulnar fracture. 3. Remote surgical changes involving the base of the first metacarpal and adjacent carpal bones. Electronically Signed   By: Marijo Sanes M.D.   On: 01/08/2019 15:22   Ct Maxillofacial Wo Cm  Result Date: 01/08/2019 CLINICAL DATA:  Head, face, and neck trauma. EXAM: CT HEAD WITHOUT CONTRAST CT MAXILLOFACIAL WITHOUT CONTRAST CT CERVICAL SPINE WITHOUT CONTRAST TECHNIQUE: Multidetector CT imaging of the head, cervical spine, and maxillofacial structures were performed using the standard protocol without intravenous contrast. Multiplanar CT image reconstructions of the cervical spine and maxillofacial structures were also generated. COMPARISON:  Report from a CT head dated 08/14/2002. FINDINGS: CT HEAD FINDINGS Brain: No evidence of acute infarction, hemorrhage, hydrocephalus, extra-axial collection or mass lesion/mass effect. There is mild cerebral volume loss with associated ex vacuo dilatation. Vascular: No hyperdense vessel or unexpected calcification. Skull: Normal. Negative for fracture or focal lesion. Other: None. CT MAXILLOFACIAL FINDINGS Osseous: No fracture or mandibular  dislocation. No destructive process. Orbits: Negative. No traumatic or inflammatory finding. Sinuses: Clear. Soft tissues: There is soft tissue swelling overlying the left forehead and periorbital region. CT CERVICAL SPINE FINDINGS Alignment: Normal. Skull base and vertebrae: No acute fracture. No primary bone lesion or focal pathologic process. Soft tissues and spinal canal: No prevertebral fluid or swelling. No visible canal hematoma. Disc levels: There are varying degrees of degenerative disc and joint disease. Upper chest: Negative. Other: None IMPRESSION: 1. No acute intracranial process. 2. No acute facial fracture. 3. No acute osseous injury in the cervical spine. Electronically Signed   By: Zerita Boers M.D.   On: 01/08/2019 13:01    Procedures .Marland KitchenLaceration Repair  Date/Time: 01/08/2019 1:39 PM Performed by: Hayden Rasmussen, MD Authorized by: Hayden Rasmussen, MD   Consent:    Consent obtained:  Verbal   Consent given by:  Patient   Risks discussed:  Infection, pain, poor cosmetic result, poor wound healing and retained foreign body   Alternatives discussed:  No treatment and delayed treatment Anesthesia (see MAR for exact dosages):    Anesthesia method:  None Laceration details:    Location:  Lip   Lip  location:  Upper exterior lip   Length (cm):  1 Repair type:    Repair type:  Simple Treatment:    Area cleansed with:  Saline Skin repair:    Repair method:  Tissue adhesive Approximation:    Approximation:  Close   Vermilion border: well-aligned   Post-procedure details:    Dressing:  Open (no dressing)   Patient tolerance of procedure:  Tolerated well, no immediate complications   (including critical care time)  Medications Ordered in ED Medications  morphine 4 MG/ML injection 4 mg (4 mg Intravenous Given 01/08/19 1107)  Tdap (BOOSTRIX) injection 0.5 mL (0.5 mLs Intramuscular Given 01/08/19 1107)  HYDROcodone-acetaminophen (NORCO/VICODIN) 5-325 MG per tablet 1  tablet (1 tablet Oral Given 01/08/19 1509)     Initial Impression / Assessment and Plan / ED Course  I have reviewed the triage vital signs and the nursing notes.  Pertinent labs & imaging results that were available during my care of the patient were reviewed by me and considered in my medical decision making (see chart for details).  Clinical Course as of Jan 07 1530  Wynelle LinkSun Jan 08, 2019  1218 Patient here after mechanical fall with what looks to be an obvious left wrist fracture.  Left facial abrasions and contusion and a lip laceration that does not cross the vermillion border on her upper lip.  No malocclusion.   [MB]  1219 Reevaluated after pain medication given she said she feels better.   [MB]  1219 Left wrist x-ray interpreted by me as comminuted intra-articular distal radius fracture.   [MB]  1258 D[facility]ed with [facility].  He recommends getting a CT of that wrist.  He fits all degenerative then he feels she can be put in a sugar tong and follow-up in the office Monday or Wednesday.   [MB]  1307 CT her face and neck unremarkable, c-collar removed.  Radiology called me about the CT of her chest with an insult finding of a likely cyst on the dome of the liver.  I reviewed this with the patient.   [MB]  1525 CT of wrist showing no acute findings other than the previously identified distal radius fracture.  Put her in to get a splint and sling and put a discharge instructions together.   [MB]    Clinical Course User Index [MB] Terrilee FilesButler, Sabrie Moritz C, MD        Final Clinical Impressions(s) / ED Diagnoses   Final diagnoses:  Closed fracture of distal end of left radius, unspecified fracture morphology, initial encounter  Contusion of face, initial encounter  Lip laceration, initial encounter  Fall, initial encounter    ED Discharge Orders         Ordered    HYDROcodone-acetaminophen (NORCO/VICODIN) 5-325 MG tablet  Every 6 hours PRN     01/08/19 1440            Terrilee FilesButler, Chanc Kervin C, MD 01/08/19 1710

## 2019-01-08 NOTE — Discharge Instructions (Addendum)
You were seen in the emergency department for evaluation of injuries from a fall.  You had a CAT scan of your face head neck and chest.  There was an incidental finding on your liver that will need follow-up with your doctor.  We are enclosing a report of that below.  Your left wrist was broken and you were placed in a splint.  Please contact Dr. Luanna Cole office tomorrow morning for follow-up either tomorrow or Wednesday.  Ice and elevation.  Return to the emergency department if any acute weakness numbness or any other concerning symptoms.   IMPRESSION:  1. No acute intrathoracic findings.  2. 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  3. Multiple areas of low attenuation in the liver, some of which are  well-circumscribed and water density likely represent cysts, some  are slightly more dense than other areas of lobular contours. The  largest lesion near the dome is slightly more dense than other areas  of low attenuation, and may represent a mildly complex cyst or  hemangioma. Consider further assessment with ultrasound on  follow-up.  4. Aortic atherosclerosis.  5. These results were called by telephone at the time of  interpretation on 01/08/2019 at 12:45 pm to provider [facility] ,  who verbally acknowledged these results.

## 2019-01-08 NOTE — ED Provider Notes (Signed)
I received this patient in signout from Dr. Melina Copa.  She had presented with fall, had left distal radius fracture, and at time of signout was awaiting splinting prior to discharge.  Splint was placed and good circulation and sensation afterwards.  Discussed supportive measures.  Also reviewed wound care for area that was Dermabonded.  Patient prefers following up with Dr. Amedeo Plenty as he has performed surgery on her previously.  Return precautions reviewed.   Little, Wenda Overland, MD 01/08/19 1622

## 2019-01-08 NOTE — ED Triage Notes (Addendum)
Pt arrived via EMS from walking dog in park and her dog charged at another and dragged her on the ground. Patient denies LOC or neuro complications.   L wrist deformity - with pulses  Lip laceration Abrasion & hematoma R eyebrow  L sided rib pain - no obv deformity  58 HR 96% RA 14 R 130/90 BP

## 2019-01-09 DIAGNOSIS — S52502A Unspecified fracture of the lower end of left radius, initial encounter for closed fracture: Secondary | ICD-10-CM | POA: Diagnosis not present

## 2019-01-09 DIAGNOSIS — M25532 Pain in left wrist: Secondary | ICD-10-CM | POA: Diagnosis not present

## 2019-01-10 DIAGNOSIS — S52572A Other intraarticular fracture of lower end of left radius, initial encounter for closed fracture: Secondary | ICD-10-CM | POA: Diagnosis not present

## 2019-01-19 DIAGNOSIS — Z4789 Encounter for other orthopedic aftercare: Secondary | ICD-10-CM | POA: Diagnosis not present

## 2019-01-19 DIAGNOSIS — S52572D Other intraarticular fracture of lower end of left radius, subsequent encounter for closed fracture with routine healing: Secondary | ICD-10-CM | POA: Diagnosis not present

## 2019-01-19 DIAGNOSIS — M25532 Pain in left wrist: Secondary | ICD-10-CM | POA: Diagnosis not present

## 2019-01-25 DIAGNOSIS — K7689 Other specified diseases of liver: Secondary | ICD-10-CM | POA: Diagnosis not present

## 2019-01-25 DIAGNOSIS — K130 Diseases of lips: Secondary | ICD-10-CM | POA: Diagnosis not present

## 2019-01-30 ENCOUNTER — Other Ambulatory Visit: Payer: Self-pay | Admitting: Family Medicine

## 2019-01-30 DIAGNOSIS — K7689 Other specified diseases of liver: Secondary | ICD-10-CM

## 2019-02-07 DIAGNOSIS — M25562 Pain in left knee: Secondary | ICD-10-CM | POA: Diagnosis not present

## 2019-02-07 DIAGNOSIS — M545 Low back pain: Secondary | ICD-10-CM | POA: Diagnosis not present

## 2019-02-08 ENCOUNTER — Ambulatory Visit: Payer: Medicare Other | Admitting: Plastic Surgery

## 2019-02-08 ENCOUNTER — Encounter: Payer: Self-pay | Admitting: Plastic Surgery

## 2019-02-08 ENCOUNTER — Other Ambulatory Visit: Payer: Self-pay

## 2019-02-08 VITALS — BP 117/73 | HR 59 | Temp 97.7°F | Ht 67.0 in | Wt 144.0 lb

## 2019-02-08 DIAGNOSIS — L905 Scar conditions and fibrosis of skin: Secondary | ICD-10-CM

## 2019-02-08 NOTE — Progress Notes (Signed)
   Referring Provider Merri Brunette, MD [phone] [place],  [place] [address]   CC:  Chief Complaint  Patient presents with  . Advice Only    Consultation for lip injury      Allison Harris is an 74 y.o. female.  HPI: Patient presents to discuss left upper lip deformity after trauma 1 month ago.  She had a fall and was seen in the emergency room.  She had some facial trauma and a black eye on the left side and a small laceration to her upper lip.  She believes this is from contact with one of her teeth.  She also had a left wrist fracture that is been fixed with an open reduction internal fixation and is in a cast for that.  Her main issue regarding her lip is there is a firm area that seems abnormal to her.  The scarring itself is not particularly bad but the firmness is bothering her.  No Known Allergies  Outpatient Encounter Medications as of 02/08/2019  Medication Sig  . lisinopril-hydrochlorothiazide (ZESTORETIC) 10-12.5 MG tablet Take 1 tablet by mouth daily.  . Multiple Vitamin (MULTIVITAMIN) tablet Take 1 tablet by mouth daily.  Marland Kitchen SYNTHROID 25 MCG tablet Take 25 mcg by mouth every morning.  . Thiamine HCl (VITAMIN B-1 PO) Take 1 tablet by mouth daily.  Marland Kitchen VITAMIN D PO Take 1 capsule by mouth daily.  . [DISCONTINUED] HYDROcodone-acetaminophen (NORCO/VICODIN) 5-325 MG tablet Take 1-2 tablets by mouth every 6 (six) hours as needed.   No facility-administered encounter medications on file as of 02/08/2019.     No past medical history on file.  No past surgical history on file.  Family History  Problem Relation Age of Onset  . Breast cancer Neg Hx     Social History   Social History Narrative  . Not on file  Denies tobacco use  Review of Systems General: Denies fevers, chills, weight loss CV: Denies chest pain, shortness of breath, palpitations  Physical Exam Vitals with BMI 02/08/2019 01/08/2019 01/08/2019  Height 5\' 7"  - -  Weight 144 lbs - -  BMI 22.55 -  -  Systolic 117 102  Diastolic 73 50 66  Pulse 59 62 57    General:  No acute distress,  Alert and oriented, Non-Toxic, Normal speech and affect HEENT: Normocephalic atraumatic.  Cranial nerves grossly intact.  Extraocular movements intact.  In the left upper lip there is a small external scar that is less than a centimeter in size.  There is a palpable subcutaneous nodule that feels like scar tissue.  I do not see an obvious wound intraorally.  I do not see any obvious violation of the vermilion border with the scar.  Assessment/Plan Patient presents 1 month after facial trauma with what looks like residual firmness from scarring in the deeper tissues of the left upper lip.  I suspect that this will improve with time and massage.  Plan to see her again in 2 months to check this again and if it continues to be unsatisfactory then we could consider steroid injection at that time.  I explained that surgical intervention to remove this might ultimately leave her with a more obvious scar and I did not feel it was a good idea to do that at this time.  She agreed and we will plan to see her at her next visit.  741 02/08/2019, 11:45 AM

## 2019-02-09 ENCOUNTER — Ambulatory Visit
Admission: RE | Admit: 2019-02-09 | Discharge: 2019-02-09 | Disposition: A | Discharge status: Home / Self Care | Payer: Medicare Other | Source: Ambulatory Visit | Attending: Family Medicine | Admitting: Family Medicine

## 2019-02-09 DIAGNOSIS — K7689 Other specified diseases of liver: Secondary | ICD-10-CM | POA: Diagnosis not present

## 2019-02-09 DIAGNOSIS — M79642 Pain in left hand: Secondary | ICD-10-CM | POA: Diagnosis not present

## 2019-02-09 DIAGNOSIS — M25532 Pain in left wrist: Secondary | ICD-10-CM | POA: Diagnosis not present

## 2019-02-09 DIAGNOSIS — Z4789 Encounter for other orthopedic aftercare: Secondary | ICD-10-CM | POA: Diagnosis not present

## 2019-02-09 DIAGNOSIS — S52572D Other intraarticular fracture of lower end of left radius, subsequent encounter for closed fracture with routine healing: Secondary | ICD-10-CM | POA: Diagnosis not present

## 2019-02-09 IMAGING — US US ABDOMEN LIMITED
1 series · 13 of 25 positions shown · non-contrast
Comparison: CT chest [DATE]

CLINICAL DATA: History of hepatic cyst on outside MRI

EXAM:
ULTRASOUND ABDOMEN LIMITED RIGHT UPPER QUADRANT

[Series 1: us abdomen limited · 0.14mm/px · 13 of 63 slices shown]
[im 1/63]
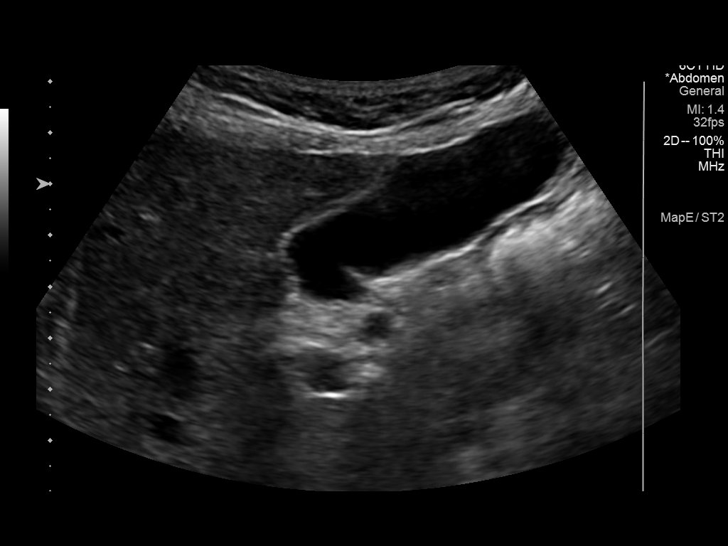
[im 6/63]
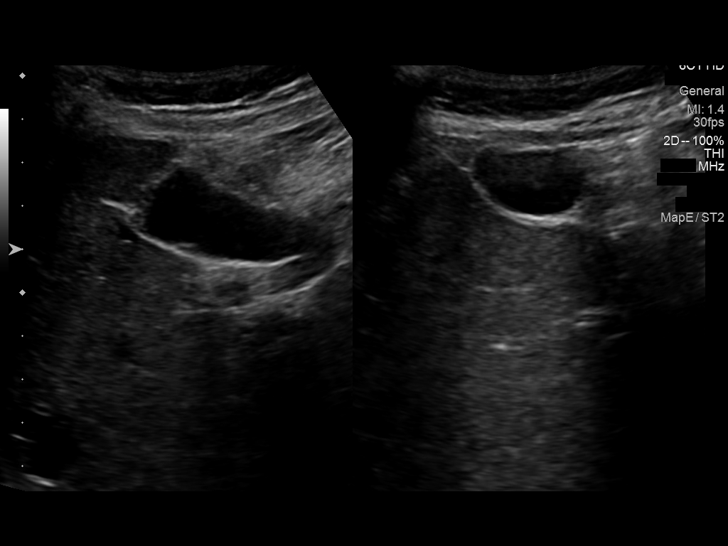
[im 11/63]
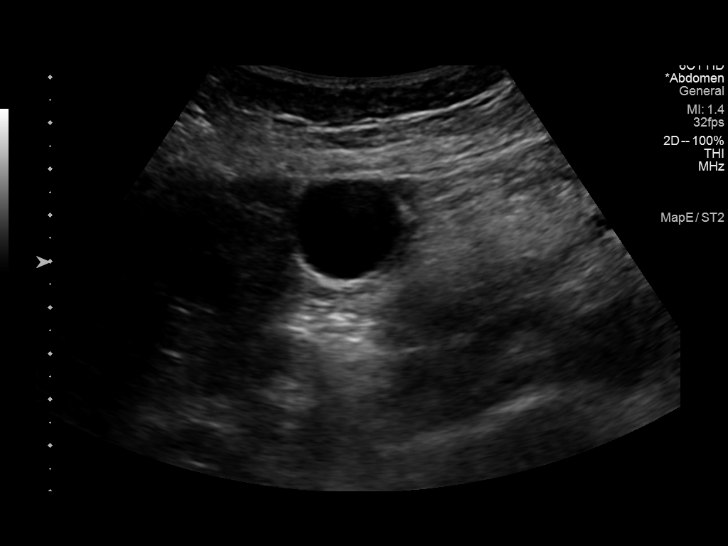
[im 16/63]
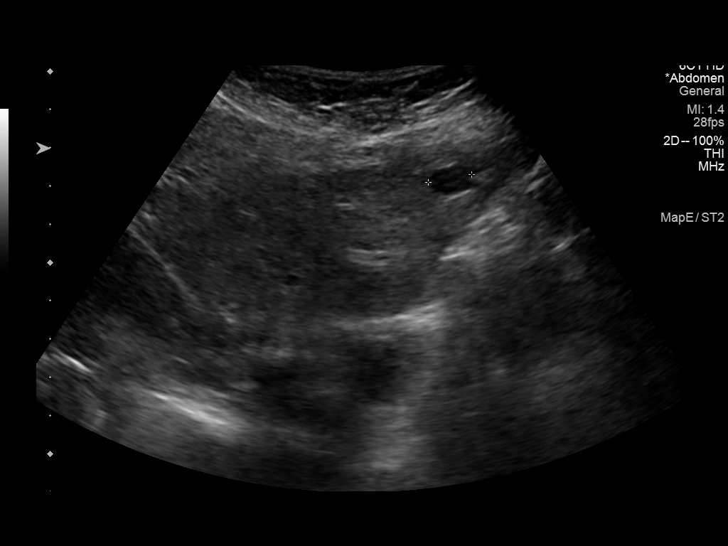
[im 21/63]
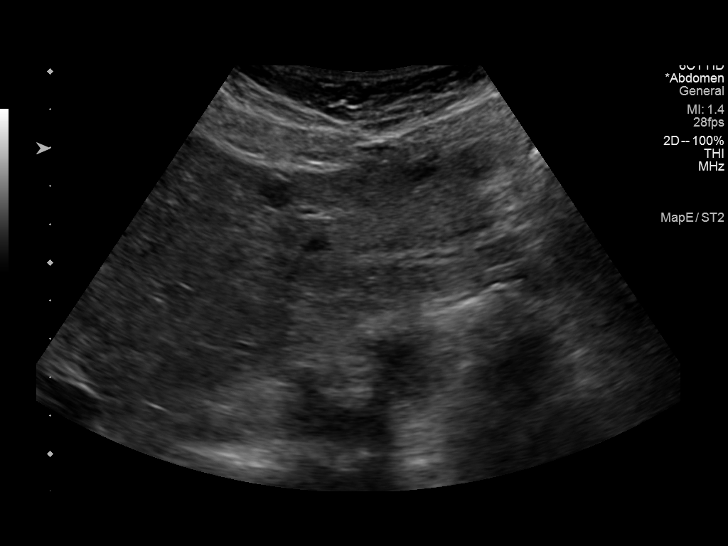
[im 26/63]
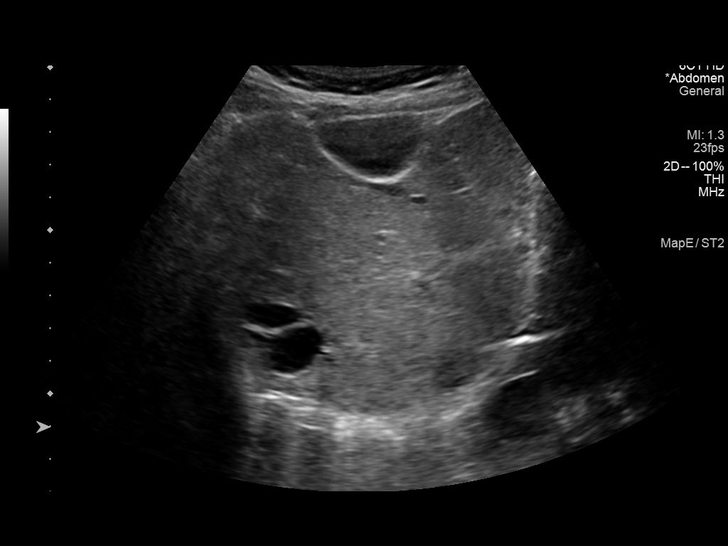
[im 32/63]
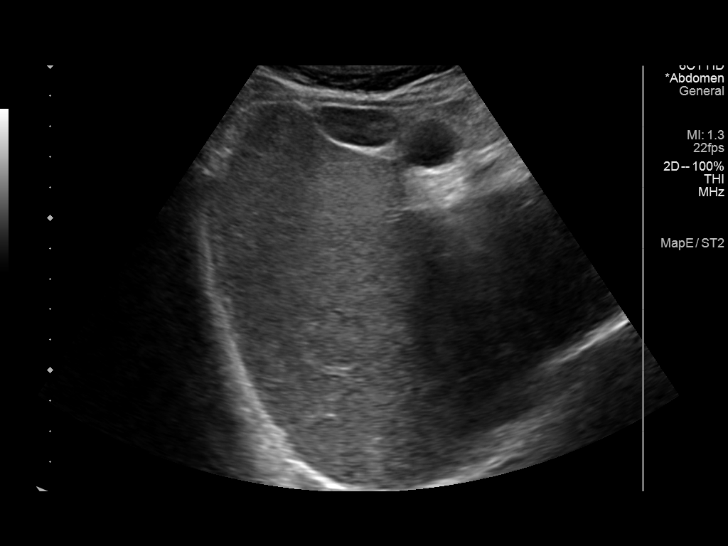
[im 37/63]
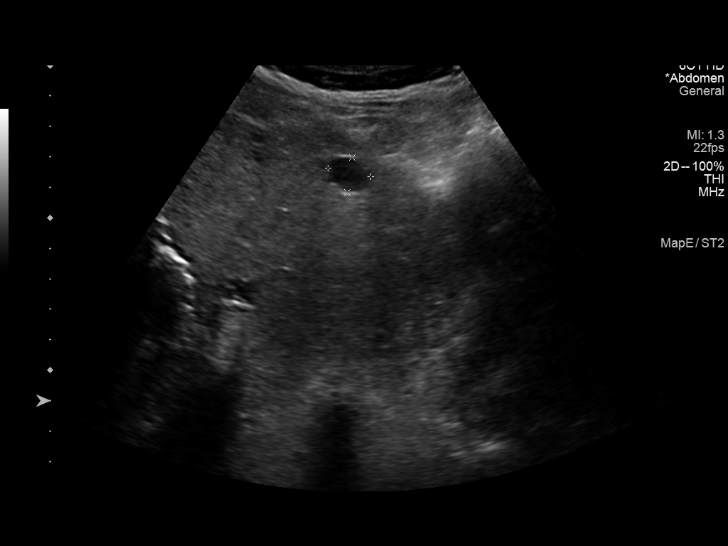
[im 42/63]
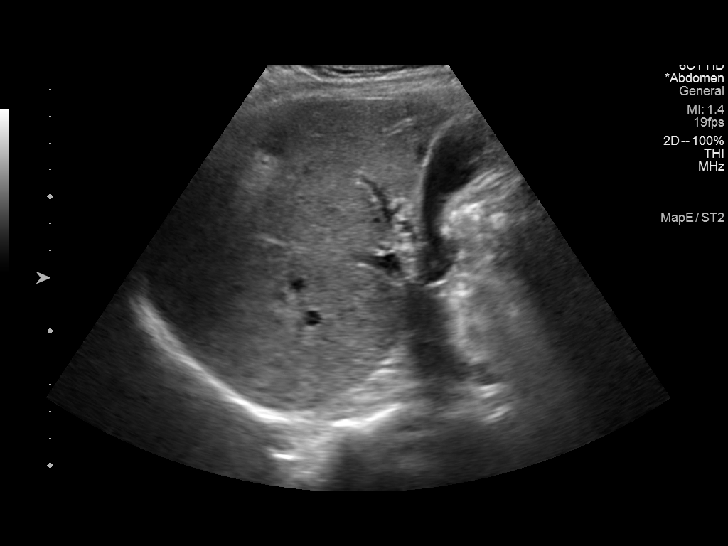
[im 47/63]
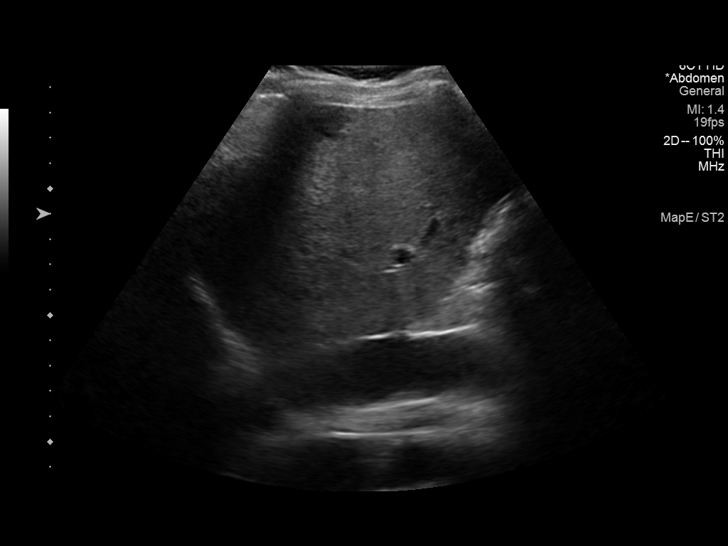
[im 52/63]
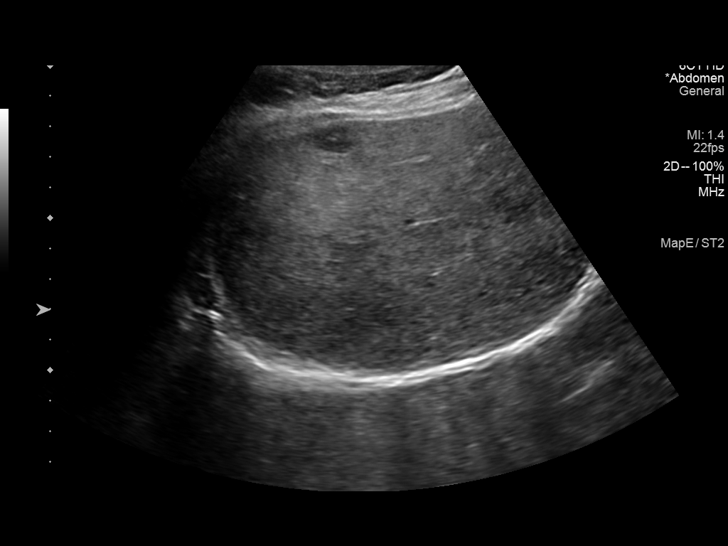
[im 57/63]
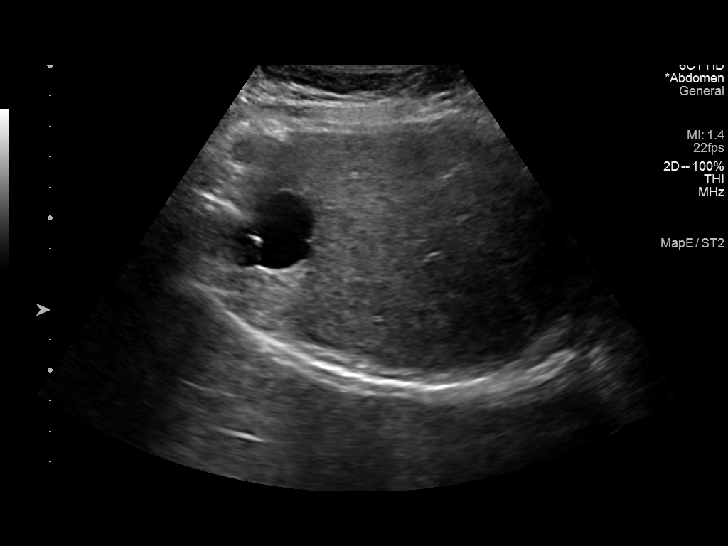
[im 63/63]
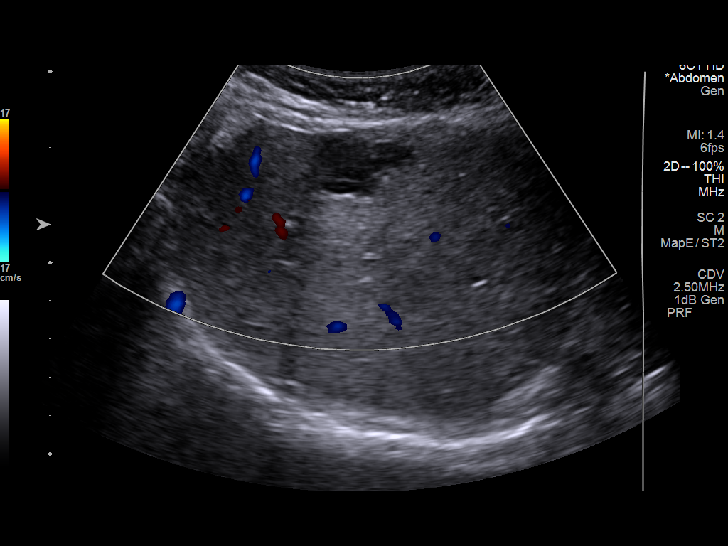

[13 of 25 positions shown; findings below may reference images not displayed]

FINDINGS: Gallbladder:

No gallstones or wall thickening visualized. No sonographic Murphy
sign noted by sonographer.

Common bile duct:

Diameter: 5 mm

Liver:

There are multiple round hypoechoic to anechoic structures within
both lobes of the liver with thin imperceptible walls and increased
through transmission compatible with cysts. For example there is a
1.2 cm cyst within the left hepatic lobe. There is a lobulated cyst
with thin internal septation near the right hepatic dome measuring
3.7 x 2.0 x 3.5 cm. Additional cyst with a slightly more thickened
septation within the posterior aspect of the right hepatic lobe
measures 2.3 x 2.1 x 2.4 cm. No vascularity is seen within the
lesions on color Doppler assessment. Within normal limits in
parenchymal echogenicity. Portal vein is patent on color Doppler
imaging with normal direction of blood flow towards the liver.

Other: None.
IMPRESSION: 1. Multiple cysts within both lobes of the liver. The largest
measures up to 3.7 cm within the right hepatic dome which contains a
thin internal septation. An additional 2.4 cm cyst posteriorly
within the right hepatic lobe has a slightly thickened internal
septation. No definite nodular or vascular component. No solid
hepatic lesion was identified. A hepatic protocol MRI, if not
performed elsewhere, is recommended to further evaluate the complex
cysts.
2. Unremarkable gallbladder.

## 2019-02-13 DIAGNOSIS — M25562 Pain in left knee: Secondary | ICD-10-CM | POA: Diagnosis not present

## 2019-02-13 DIAGNOSIS — M545 Low back pain: Secondary | ICD-10-CM | POA: Diagnosis not present

## 2019-02-15 DIAGNOSIS — M545 Low back pain: Secondary | ICD-10-CM | POA: Diagnosis not present

## 2019-02-15 DIAGNOSIS — M25562 Pain in left knee: Secondary | ICD-10-CM | POA: Diagnosis not present

## 2019-02-15 DIAGNOSIS — M25532 Pain in left wrist: Secondary | ICD-10-CM | POA: Diagnosis not present

## 2019-02-21 DIAGNOSIS — M545 Low back pain: Secondary | ICD-10-CM | POA: Diagnosis not present

## 2019-02-21 DIAGNOSIS — M25562 Pain in left knee: Secondary | ICD-10-CM | POA: Diagnosis not present

## 2019-02-22 ENCOUNTER — Other Ambulatory Visit: Payer: Self-pay | Admitting: Family Medicine

## 2019-02-22 DIAGNOSIS — M25532 Pain in left wrist: Secondary | ICD-10-CM | POA: Diagnosis not present

## 2019-02-24 ENCOUNTER — Other Ambulatory Visit: Payer: Self-pay | Admitting: Family Medicine

## 2019-02-24 DIAGNOSIS — K7689 Other specified diseases of liver: Secondary | ICD-10-CM

## 2019-02-24 DIAGNOSIS — M25562 Pain in left knee: Secondary | ICD-10-CM | POA: Diagnosis not present

## 2019-02-24 DIAGNOSIS — M545 Low back pain: Secondary | ICD-10-CM | POA: Diagnosis not present

## 2019-02-25 ENCOUNTER — Ambulatory Visit
Admission: RE | Admit: 2019-02-25 | Discharge: 2019-02-25 | Disposition: A | Discharge status: Home / Self Care | Payer: Medicare Other | Source: Ambulatory Visit | Attending: Family Medicine | Admitting: Family Medicine

## 2019-02-25 ENCOUNTER — Other Ambulatory Visit: Payer: Self-pay

## 2019-02-25 DIAGNOSIS — K7689 Other specified diseases of liver: Secondary | ICD-10-CM

## 2019-02-26 ENCOUNTER — Ambulatory Visit: Payer: Medicare Other | Attending: Internal Medicine

## 2019-02-26 DIAGNOSIS — Z23 Encounter for immunization: Secondary | ICD-10-CM

## 2019-02-26 NOTE — Progress Notes (Signed)
   Covid-19 Vaccination Clinic  Name:  Allison Harris    MRN: [id number] DOB: 04-29-1945  02/26/2019  [name] was observed post Covid-19 immunization for 15 minutes without incidence. She was provided with Vaccine Information Sheet and instruction to access the V-Safe system.   [name] was instructed to call 911 with any severe reactions post vaccine: Marland Kitchen Difficulty breathing  . Swelling of your face and throat  . A fast heartbeat  . A bad rash all over your body  . Dizziness and weakness    Immunizations Administered    Name Date Dose VIS Date Route   Pfizer COVID-19 Vaccine 02/26/2019 10:08 AM 0.3 mL 01/13/2019 Intramuscular   Manufacturer: [employer], [employer]   Lot: [id number]   NDC: [address]-6

## 2019-02-27 ENCOUNTER — Other Ambulatory Visit: Payer: Self-pay | Admitting: Family Medicine

## 2019-02-27 DIAGNOSIS — K7689 Other specified diseases of liver: Secondary | ICD-10-CM

## 2019-03-01 ENCOUNTER — Other Ambulatory Visit: Payer: Self-pay

## 2019-03-01 ENCOUNTER — Ambulatory Visit
Admission: RE | Admit: 2019-03-01 | Discharge: 2019-03-01 | Disposition: A | Discharge status: Home / Self Care | Payer: Medicare Other | Source: Ambulatory Visit | Attending: Family Medicine | Admitting: Family Medicine

## 2019-03-01 DIAGNOSIS — M25532 Pain in left wrist: Secondary | ICD-10-CM | POA: Diagnosis not present

## 2019-03-01 DIAGNOSIS — K7689 Other specified diseases of liver: Secondary | ICD-10-CM

## 2019-03-01 IMAGING — MR MR ABDOMEN WO/W CM
11 of 17 series · 28 of 48 positions shown · IV contrast (13 ML MULTIHANCE)
Comparison: Ultrasound [DATE]

CLINICAL DATA: Hepatic cyst. Some cystic complexity identified on
comparison ultrasound. MRI recommended for further characterization.

EXAM:
MRI ABDOMEN WITHOUT AND WITH CONTRAST
TECHNIQUE: Multiplanar multisequence MR imaging of the abdomen was performed
both before and after the administration of intravenous contrast.
CONTRAST:  13mL MULTIHANCE GADOBENATE DIMEGLUMINE 529 MG/ML IV SOLN

[Series 3: cor haste · coronal · 5.0mm · 0.74mm/px · 2 of 32 slices shown]
[im 1/32]
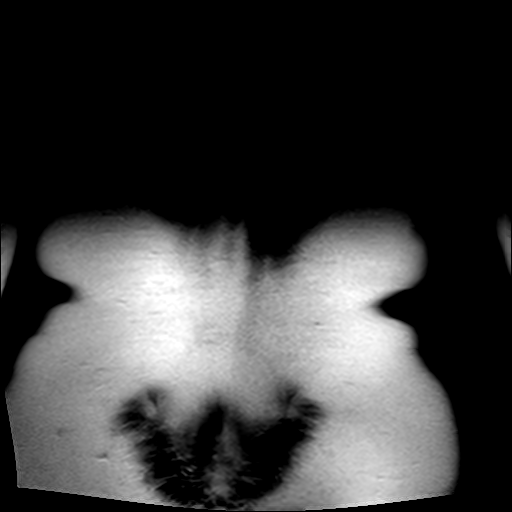
[im 32/32]
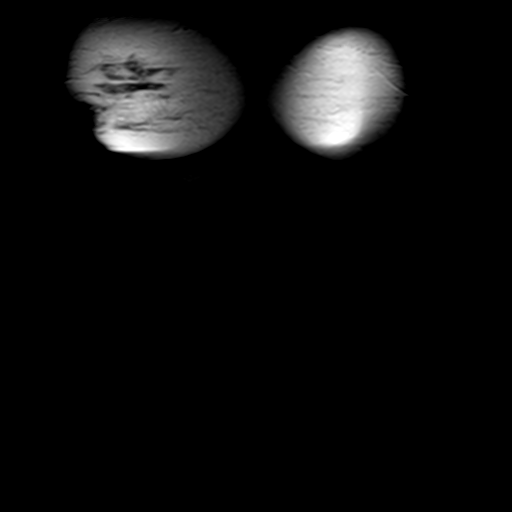

[Series 4: T1 · axial · 6.0mm · 0.74mm/px · z∈[-108,+103]mm · 4 of 66 slices shown]
[im 1/66]
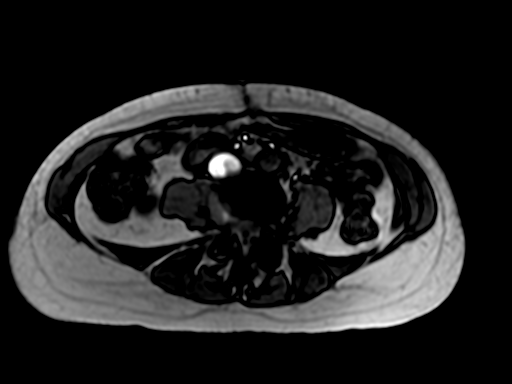
[im 22/66]
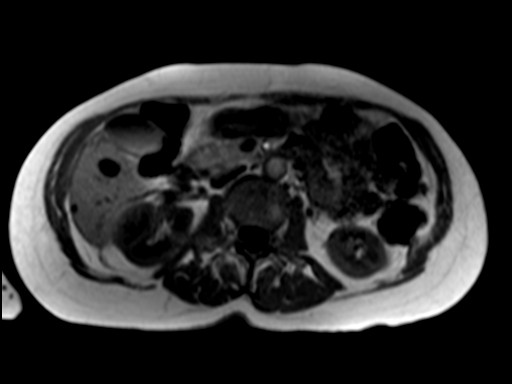
[im 44/66]
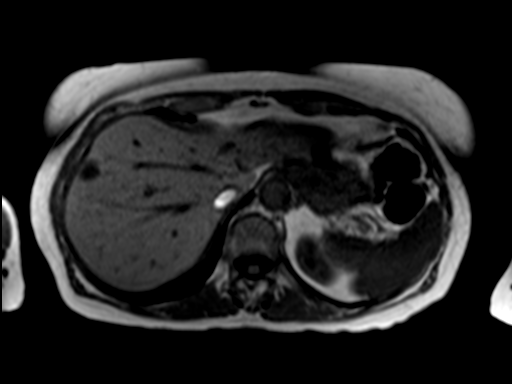
[im 66/66]
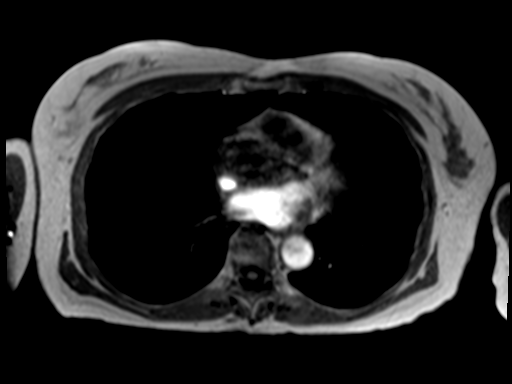

[Series 5: axial haste · axial · 6.0mm · 0.74mm/px · z∈[-98,+93]mm · 2 of 30 slices shown]
[im 1/30]
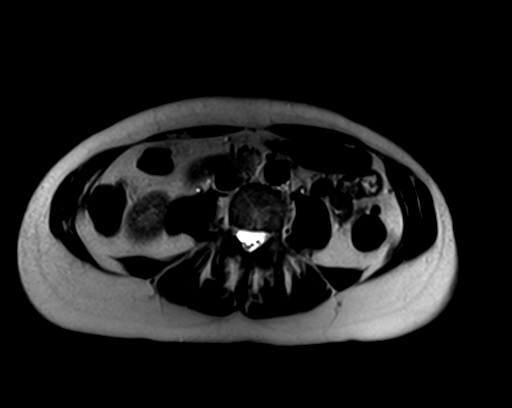
[im 30/30]
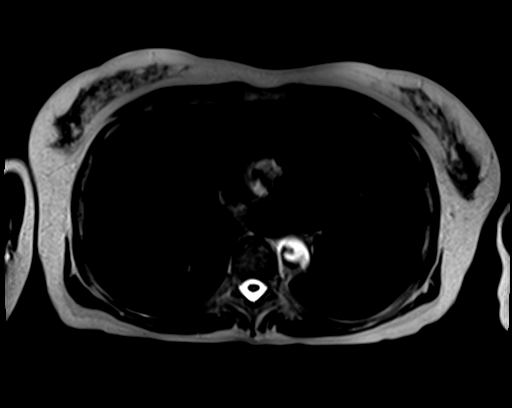

[Series 6: T2 · axial · 6.0mm · 1.12mm/px · z∈[-84,+125]mm · 2 of 30 slices shown]
[im 1/30]
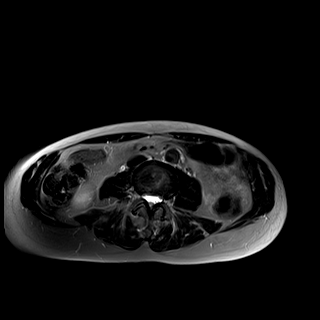
[im 30/30]
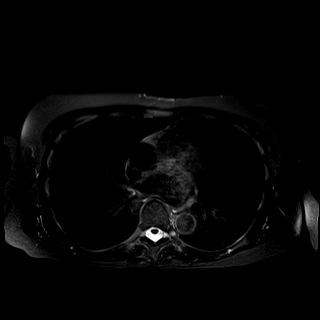

[Series 7: [id number]_trig · axial · 6.0mm · 1.98mm/px · z∈[-81,+128]mm · 4 of 90 slices shown]
[im 1/90]
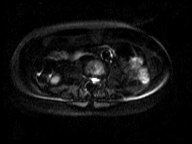
[im 30/90]
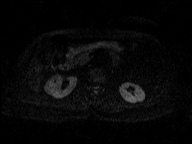
[im 60/90]
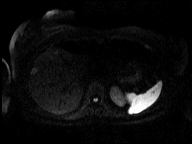
[im 90/90]
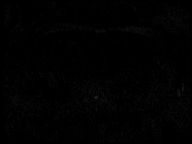

[Series 8: [id number]_trig_adc · axial · 6.0mm · 1.98mm/px · 1 of 30 slices shown]
[im 1/30]
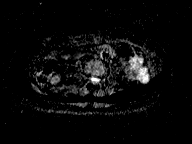

[Series 9: bSSFP · axial · 4.0mm · 0.74mm/px · z∈[-92,+96]mm · 2 of 48 slices shown]
[im 1/48]
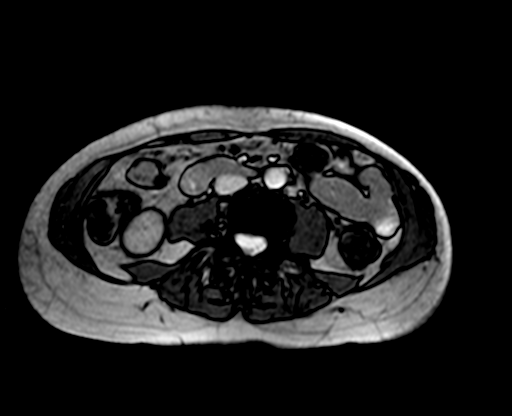
[im 48/48]
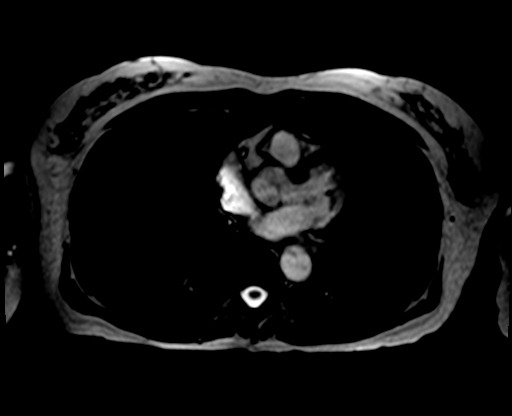

[Series 10: T1 dynamic · axial · non-contrast · 2.5mm · 0.74mm/px · z∈[-97,+101]mm · 3 of 80 slices shown]
[im 1/80]
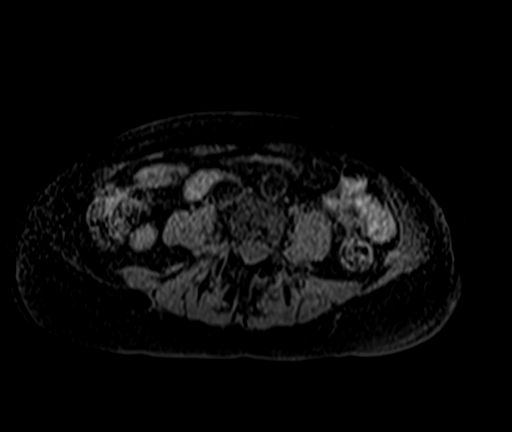
[im 40/80]
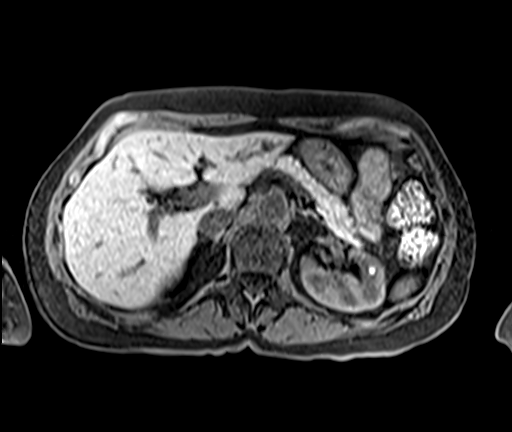
[im 80/80]
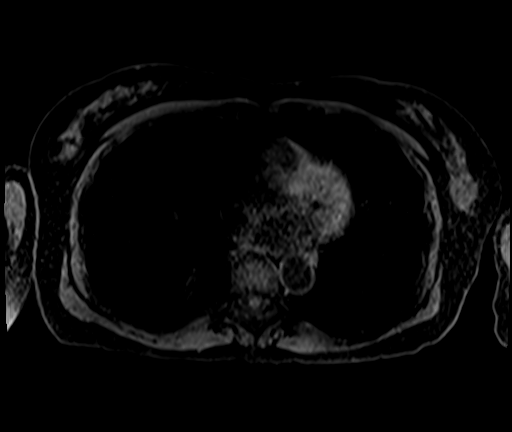

[Series 11: T1 dynamic post-contrast · axial · 2.5mm · 0.74mm/px · z∈[-97,+101]mm · 3 of 80 slices shown (1 of 3)]
[im 1/80]
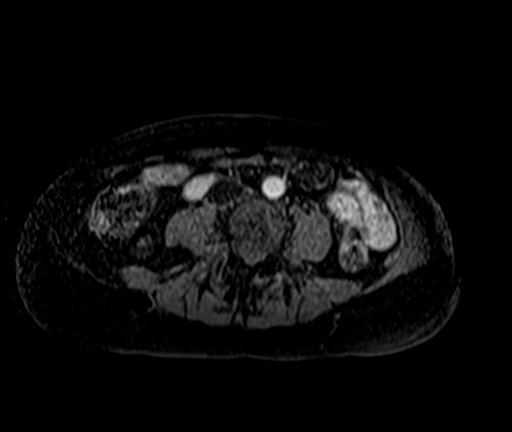
[im 40/80]
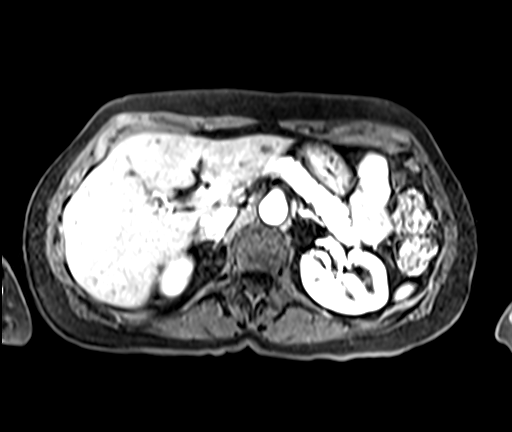
[im 80/80]
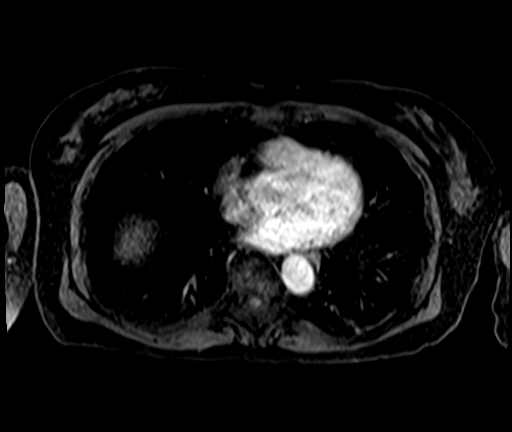

[Series 12: T1 dynamic post-contrast · axial · 2.5mm · 0.74mm/px · z∈[-97,+101]mm · 3 of 80 slices shown (2 of 3)]
[im 1/80]
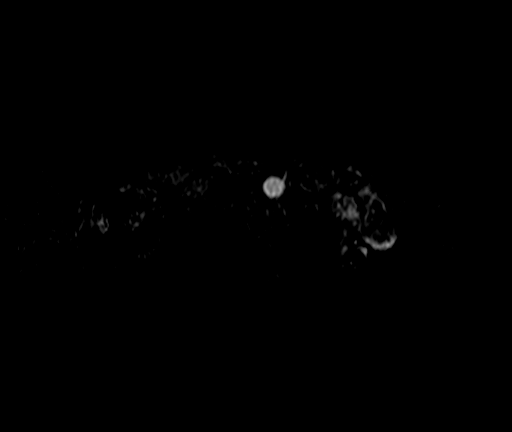
[im 40/80]
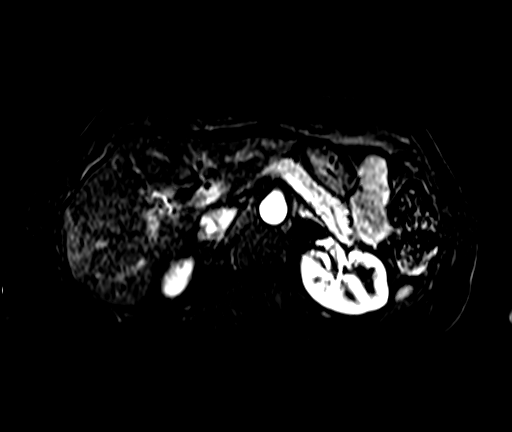
[im 80/80]
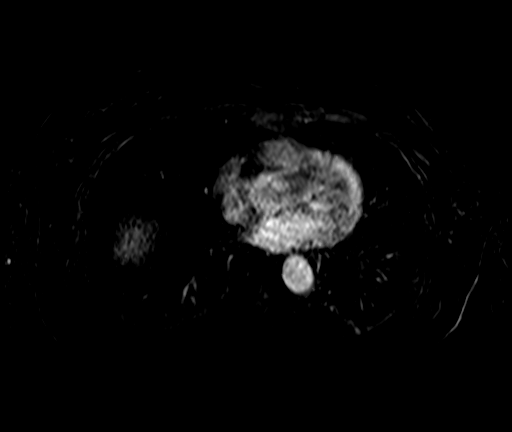

[Series 13: T1 dynamic post-contrast · axial · 2.5mm · 0.74mm/px · z∈[-97,+1]mm · 2 of 80 slices shown (3 of 3)]
[im 1/80]
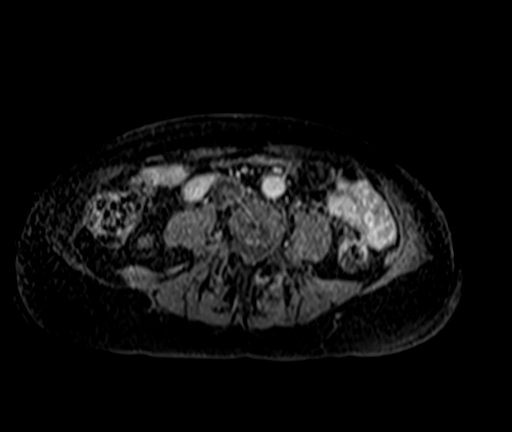
[im 40/80]
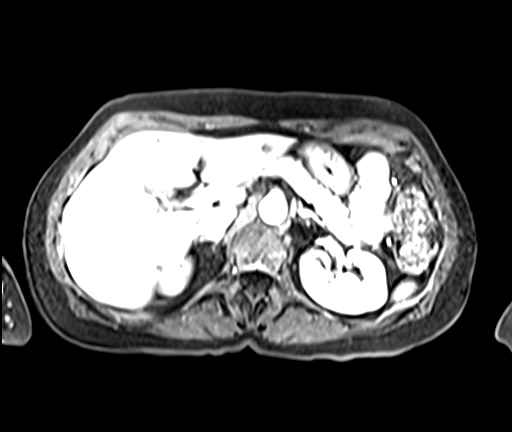

[28 of 48 positions shown; findings below may reference images not displayed]

FINDINGS: Lower chest:  Lung bases are clear.

Hepatobiliary: Multiple well-circumscribed cyst within LEFT and
RIGHT hepatic lobe with typical high signal intensity on T2 weighted
imaging (series 5). One larger cyst (3.2 cm) does have a thin
internal septation on image [DATE]. On postcontrast imaging there is
no suspicious enhancement or nodularity in this cyst or the
additional multiple cysts.

Normal hepatic parenchyma. No hepatic steatosis. No biliary duct
dilatation. Gallbladder normal. Common bile duct.

Pancreas: Normal pancreatic parenchymal intensity. No ductal
dilatation or inflammation.

Spleen: Normal spleen.

Adrenals/urinary tract: Adrenal glands normal. Very small
nonenhancing cysts within the LEFT kidney are less than 5 mm. One
cyst in the LEFT kidney is proteinaceous.

Stomach/Bowel: Stomach and limited of the small bowel is
unremarkable

Vascular/Lymphatic: Abdominal aortic normal caliber. No
retroperitoneal periportal lymphadenopathy.

Musculoskeletal: No aggressive osseous lesion
IMPRESSION: 1. Multiple benign hepatic cysts.
2. Benign small Bosniak 1 and Bosniak 2 renal cysts.

## 2019-03-01 MED ORDER — GADOBENATE DIMEGLUMINE 529 MG/ML IV SOLN
13.0000 mL | Freq: Once | INTRAVENOUS | Status: AC | PRN
  Administered 2019-03-01: 13 mL via INTRAVENOUS

## 2019-03-02 DIAGNOSIS — M25562 Pain in left knee: Secondary | ICD-10-CM | POA: Diagnosis not present

## 2019-03-02 DIAGNOSIS — M545 Low back pain: Secondary | ICD-10-CM | POA: Diagnosis not present

## 2019-03-03 ENCOUNTER — Other Ambulatory Visit: Payer: Medicare Other

## 2019-03-08 DIAGNOSIS — M25562 Pain in left knee: Secondary | ICD-10-CM | POA: Diagnosis not present

## 2019-03-08 DIAGNOSIS — M545 Low back pain: Secondary | ICD-10-CM | POA: Diagnosis not present

## 2019-03-09 DIAGNOSIS — Z4789 Encounter for other orthopedic aftercare: Secondary | ICD-10-CM | POA: Diagnosis not present

## 2019-03-09 DIAGNOSIS — M79642 Pain in left hand: Secondary | ICD-10-CM | POA: Diagnosis not present

## 2019-03-09 DIAGNOSIS — S52572D Other intraarticular fracture of lower end of left radius, subsequent encounter for closed fracture with routine healing: Secondary | ICD-10-CM | POA: Diagnosis not present

## 2019-03-10 DIAGNOSIS — M25562 Pain in left knee: Secondary | ICD-10-CM | POA: Diagnosis not present

## 2019-03-10 DIAGNOSIS — M545 Low back pain: Secondary | ICD-10-CM | POA: Diagnosis not present

## 2019-03-14 DIAGNOSIS — M4316 Spondylolisthesis, lumbar region: Secondary | ICD-10-CM | POA: Diagnosis not present

## 2019-03-14 DIAGNOSIS — M5442 Lumbago with sciatica, left side: Secondary | ICD-10-CM | POA: Diagnosis not present

## 2019-03-14 DIAGNOSIS — M5136 Other intervertebral disc degeneration, lumbar region: Secondary | ICD-10-CM | POA: Diagnosis not present

## 2019-03-14 DIAGNOSIS — G8929 Other chronic pain: Secondary | ICD-10-CM | POA: Diagnosis not present

## 2019-03-16 DIAGNOSIS — M25532 Pain in left wrist: Secondary | ICD-10-CM | POA: Diagnosis not present

## 2019-03-20 ENCOUNTER — Ambulatory Visit: Payer: Medicare Other | Attending: Internal Medicine

## 2019-03-20 DIAGNOSIS — Z23 Encounter for immunization: Secondary | ICD-10-CM | POA: Insufficient documentation

## 2019-03-20 NOTE — Progress Notes (Signed)
   Covid-19 Vaccination Clinic  Name:  Allison Harris    MRN: [id number] DOB: 07-08-1945  03/20/2019  [name] was observed post Covid-19 immunization for 15 minutes without incidence. She was provided with Vaccine Information Sheet and instruction to access the V-Safe system.   [name] was instructed to call 911 with any severe reactions post vaccine: Marland Kitchen Difficulty breathing  . Swelling of your face and throat  . A fast heartbeat  . A bad rash all over your body  . Dizziness and weakness    Immunizations Administered    Name Date Dose VIS Date Route   Pfizer COVID-19 Vaccine 03/20/2019  8:25 AM 0.3 mL 01/13/2019 Intramuscular   Manufacturer: [employer], [employer]   Lot: [id number]   NDC: [address]-8

## 2019-03-22 DIAGNOSIS — M25532 Pain in left wrist: Secondary | ICD-10-CM | POA: Diagnosis not present

## 2019-03-29 DIAGNOSIS — M79642 Pain in left hand: Secondary | ICD-10-CM | POA: Diagnosis not present

## 2019-03-30 ENCOUNTER — Ambulatory Visit: Payer: Medicare Other

## 2019-04-04 DIAGNOSIS — G8929 Other chronic pain: Secondary | ICD-10-CM | POA: Diagnosis not present

## 2019-04-04 DIAGNOSIS — M5442 Lumbago with sciatica, left side: Secondary | ICD-10-CM | POA: Diagnosis not present

## 2019-04-04 DIAGNOSIS — M4316 Spondylolisthesis, lumbar region: Secondary | ICD-10-CM | POA: Diagnosis not present

## 2019-04-04 DIAGNOSIS — M4726 Other spondylosis with radiculopathy, lumbar region: Secondary | ICD-10-CM | POA: Diagnosis not present

## 2019-04-04 DIAGNOSIS — M48061 Spinal stenosis, lumbar region without neurogenic claudication: Secondary | ICD-10-CM | POA: Diagnosis not present

## 2019-04-04 DIAGNOSIS — M5136 Other intervertebral disc degeneration, lumbar region: Secondary | ICD-10-CM | POA: Diagnosis not present

## 2019-04-05 ENCOUNTER — Encounter: Payer: Self-pay | Admitting: Plastic Surgery

## 2019-04-05 ENCOUNTER — Other Ambulatory Visit: Payer: Self-pay

## 2019-04-05 ENCOUNTER — Ambulatory Visit: Payer: Medicare Other | Admitting: Plastic Surgery

## 2019-04-05 VITALS — BP 122/77 | HR 56 | Temp 97.8°F | Ht 67.0 in | Wt 146.6 lb

## 2019-04-05 DIAGNOSIS — L905 Scar conditions and fibrosis of skin: Secondary | ICD-10-CM

## 2019-04-05 NOTE — Progress Notes (Signed)
   Referring Provider Merri Brunette, MD [phone] [place],  [place] [address]   CC:  Chief Complaint  Patient presents with  . Follow-up    Patient here for follow visit up for lip injury      Allison Harris is an 74 y.o. female.  HPI: Patient is here for follow-up regarding scar of her left upper lip.  I seen her for this 2 months ago and the incident initially occurred 3 months ago at this point.  She had a fall and sustained what sounds like a through and through lip laceration.  The external aspect was glued and she does not have a very significant external scar but she is more bothered by the fullness of the upper lip and this bothers her.  This has improved to some degree over the past couple months but is still irritating her.  Review of Systems General: Denies fevers, chills  Physical Exam Vitals with BMI 04/05/2019 02/08/2019 01/08/2019  Height 5\' 7"  5\' 7"  -  Weight 146 lbs 10 oz 144 lbs -  BMI 22.96 22.55 -  Systolic 122 117  Diastolic 77 73 50  Pulse 56 59 62    General:  No acute distress,  Alert and oriented, Non-Toxic, Normal speech and affect Examination of the upper lip shows a small scar at right at the vermilion border which appears to be improving to me.  There is a fullness in the upper lip tissues and I suspect its thickened scar down in the muscle layer that she is feeling.  Assessment/Plan Patient presents with a primary complaint of fullness in the left upper lip that is bothering her on speaking and facial animation and is distracting her.  The external scar is not too bad and seems to be improving.  We discussed continuing to observe this scar fullness and thickness versus steroid injection.  She is anxious to try something and I do not think a steroid injection would hurt her progress and may soften this a bit.  The upper lip was prepped with an alcohol pad.  A total of 1/4 cc of Kenalog 40-1/4 cc of lidocaine with epi was injected.  This gave  a total volume of 0.5 cc.  She tolerated this well I will plan to see her again on an as-needed basis.  I told her to wait at least 2 months before considering doing anything else the lip.  04/05/2019, 9:34 AM

## 2019-04-20 DIAGNOSIS — M5442 Lumbago with sciatica, left side: Secondary | ICD-10-CM | POA: Diagnosis not present

## 2019-04-20 DIAGNOSIS — G8929 Other chronic pain: Secondary | ICD-10-CM | POA: Diagnosis not present

## 2019-04-20 DIAGNOSIS — M5416 Radiculopathy, lumbar region: Secondary | ICD-10-CM | POA: Diagnosis not present

## 2019-05-08 DIAGNOSIS — M5416 Radiculopathy, lumbar region: Secondary | ICD-10-CM | POA: Diagnosis not present

## 2019-05-25 DIAGNOSIS — H25013 Cortical age-related cataract, bilateral: Secondary | ICD-10-CM | POA: Diagnosis not present

## 2019-05-25 DIAGNOSIS — H524 Presbyopia: Secondary | ICD-10-CM | POA: Diagnosis not present

## 2019-05-25 DIAGNOSIS — H2513 Age-related nuclear cataract, bilateral: Secondary | ICD-10-CM | POA: Diagnosis not present

## 2019-05-25 DIAGNOSIS — D3131 Benign neoplasm of right choroid: Secondary | ICD-10-CM | POA: Diagnosis not present

## 2019-05-29 DIAGNOSIS — M5416 Radiculopathy, lumbar region: Secondary | ICD-10-CM | POA: Diagnosis not present

## 2019-05-29 DIAGNOSIS — M4316 Spondylolisthesis, lumbar region: Secondary | ICD-10-CM | POA: Diagnosis not present

## 2019-05-29 DIAGNOSIS — M5136 Other intervertebral disc degeneration, lumbar region: Secondary | ICD-10-CM | POA: Diagnosis not present

## 2019-05-31 DIAGNOSIS — M5416 Radiculopathy, lumbar region: Secondary | ICD-10-CM | POA: Diagnosis not present

## 2019-05-31 DIAGNOSIS — M4316 Spondylolisthesis, lumbar region: Secondary | ICD-10-CM | POA: Diagnosis not present

## 2019-05-31 DIAGNOSIS — M5136 Other intervertebral disc degeneration, lumbar region: Secondary | ICD-10-CM | POA: Diagnosis not present

## 2019-06-06 DIAGNOSIS — M5416 Radiculopathy, lumbar region: Secondary | ICD-10-CM | POA: Diagnosis not present

## 2019-06-06 DIAGNOSIS — M4316 Spondylolisthesis, lumbar region: Secondary | ICD-10-CM | POA: Diagnosis not present

## 2019-06-06 DIAGNOSIS — M5136 Other intervertebral disc degeneration, lumbar region: Secondary | ICD-10-CM | POA: Diagnosis not present

## 2019-06-07 ENCOUNTER — Encounter: Payer: Self-pay | Admitting: Plastic Surgery

## 2019-06-07 ENCOUNTER — Ambulatory Visit: Payer: Medicare Other | Admitting: Plastic Surgery

## 2019-06-07 ENCOUNTER — Other Ambulatory Visit: Payer: Self-pay

## 2019-06-07 VITALS — BP 115/70 | HR 68 | Temp 97.7°F | Ht 67.0 in | Wt 145.0 lb

## 2019-06-07 DIAGNOSIS — L905 Scar conditions and fibrosis of skin: Secondary | ICD-10-CM | POA: Diagnosis not present

## 2019-06-07 NOTE — Progress Notes (Signed)
   Referring Provider Merri Brunette, MD [phone] [place],  [place] [address]   CC:  Chief Complaint  Patient presents with  . Follow-up    2 mos for possible steroid injection to lip      Allison Harris is an 74 y.o. female.  HPI: Patient is here for follow-up regarding her left upper lip.  She had an initial trauma about 6 months ago at this point that caused some laceration to her upper lip.  She had a subsequent fullness and firmness to the scar that was very bothersome to her.  We initially watched this and then injected it with a steroid about 2 months ago.  She is noticed quite a bit of improvement in the interim and wants to know if any further treatment is necessary.  Review of Systems General: Denies fevers or chills  Physical Exam Vitals with BMI 06/07/2019 04/05/2019 02/08/2019  Height 5\' 7"  5\' 7"  5\' 7"   Weight 145 lbs 146 lbs 10 oz 144 lbs  BMI 22.71 22.96 22.55  Systolic 115 122  Diastolic 70 77 73  Pulse 68 56 59    General:  No acute distress,  Alert and oriented, Non-Toxic, Normal speech and affect On exam the area has gotten quite a bit smaller.  Visually it is a bit difficult to notice any scarring or contour irregularities on inspection.  On palpation I can still palpate an area of fullness but is quite a bit less prominent than before.  I estimate the area is about 3 mm in diameter with respect to the firmness.  Assessment/Plan Patient is had quite a bit of improvement after steroid injection of the left upper lip to the scar firmness.  We discussed another injection and watchful waiting.  Ultimately I recommended watching this for a bit longer as I expect the maturation of the scar will soften it naturally.  I brought up the potential downside of atrophy and skin discoloration from the steroid if too much was injected.  She is content to watch this for now and if she still bothered by it in 2 to 3 months of asked her to make another appointment and we  can entertain the notion of a another low-dose steroid injection at that time.  06/07/2019, 10:26 AM

## 2019-06-08 DIAGNOSIS — M4316 Spondylolisthesis, lumbar region: Secondary | ICD-10-CM | POA: Diagnosis not present

## 2019-06-08 DIAGNOSIS — M5416 Radiculopathy, lumbar region: Secondary | ICD-10-CM | POA: Diagnosis not present

## 2019-06-08 DIAGNOSIS — M5136 Other intervertebral disc degeneration, lumbar region: Secondary | ICD-10-CM | POA: Diagnosis not present

## 2019-06-13 DIAGNOSIS — M5416 Radiculopathy, lumbar region: Secondary | ICD-10-CM | POA: Diagnosis not present

## 2019-06-13 DIAGNOSIS — M4316 Spondylolisthesis, lumbar region: Secondary | ICD-10-CM | POA: Diagnosis not present

## 2019-06-13 DIAGNOSIS — M5136 Other intervertebral disc degeneration, lumbar region: Secondary | ICD-10-CM | POA: Diagnosis not present

## 2019-06-15 DIAGNOSIS — M5416 Radiculopathy, lumbar region: Secondary | ICD-10-CM | POA: Diagnosis not present

## 2019-06-15 DIAGNOSIS — M4316 Spondylolisthesis, lumbar region: Secondary | ICD-10-CM | POA: Diagnosis not present

## 2019-06-15 DIAGNOSIS — M5136 Other intervertebral disc degeneration, lumbar region: Secondary | ICD-10-CM | POA: Diagnosis not present

## 2019-06-22 DIAGNOSIS — M4316 Spondylolisthesis, lumbar region: Secondary | ICD-10-CM | POA: Diagnosis not present

## 2019-06-22 DIAGNOSIS — M5136 Other intervertebral disc degeneration, lumbar region: Secondary | ICD-10-CM | POA: Diagnosis not present

## 2019-06-22 DIAGNOSIS — M5416 Radiculopathy, lumbar region: Secondary | ICD-10-CM | POA: Diagnosis not present

## 2019-06-27 DIAGNOSIS — M4316 Spondylolisthesis, lumbar region: Secondary | ICD-10-CM | POA: Diagnosis not present

## 2019-06-27 DIAGNOSIS — M5416 Radiculopathy, lumbar region: Secondary | ICD-10-CM | POA: Diagnosis not present

## 2019-06-27 DIAGNOSIS — M5136 Other intervertebral disc degeneration, lumbar region: Secondary | ICD-10-CM | POA: Diagnosis not present

## 2019-07-04 DIAGNOSIS — M5136 Other intervertebral disc degeneration, lumbar region: Secondary | ICD-10-CM | POA: Diagnosis not present

## 2019-07-04 DIAGNOSIS — M5416 Radiculopathy, lumbar region: Secondary | ICD-10-CM | POA: Diagnosis not present

## 2019-07-04 DIAGNOSIS — M4316 Spondylolisthesis, lumbar region: Secondary | ICD-10-CM | POA: Diagnosis not present

## 2019-07-07 DIAGNOSIS — M4316 Spondylolisthesis, lumbar region: Secondary | ICD-10-CM | POA: Diagnosis not present

## 2019-07-07 DIAGNOSIS — M5136 Other intervertebral disc degeneration, lumbar region: Secondary | ICD-10-CM | POA: Diagnosis not present

## 2019-07-07 DIAGNOSIS — M5416 Radiculopathy, lumbar region: Secondary | ICD-10-CM | POA: Diagnosis not present

## 2019-07-11 DIAGNOSIS — M5416 Radiculopathy, lumbar region: Secondary | ICD-10-CM | POA: Diagnosis not present

## 2019-07-11 DIAGNOSIS — M4316 Spondylolisthesis, lumbar region: Secondary | ICD-10-CM | POA: Diagnosis not present

## 2019-07-11 DIAGNOSIS — M5136 Other intervertebral disc degeneration, lumbar region: Secondary | ICD-10-CM | POA: Diagnosis not present

## 2019-07-13 DIAGNOSIS — M5136 Other intervertebral disc degeneration, lumbar region: Secondary | ICD-10-CM | POA: Diagnosis not present

## 2019-07-13 DIAGNOSIS — M5416 Radiculopathy, lumbar region: Secondary | ICD-10-CM | POA: Diagnosis not present

## 2019-07-13 DIAGNOSIS — M4316 Spondylolisthesis, lumbar region: Secondary | ICD-10-CM | POA: Diagnosis not present

## 2019-08-03 DIAGNOSIS — M4316 Spondylolisthesis, lumbar region: Secondary | ICD-10-CM | POA: Diagnosis not present

## 2019-08-03 DIAGNOSIS — M5136 Other intervertebral disc degeneration, lumbar region: Secondary | ICD-10-CM | POA: Diagnosis not present

## 2019-08-03 DIAGNOSIS — M5416 Radiculopathy, lumbar region: Secondary | ICD-10-CM | POA: Diagnosis not present

## 2019-10-12 DIAGNOSIS — D1801 Hemangioma of skin and subcutaneous tissue: Secondary | ICD-10-CM | POA: Diagnosis not present

## 2019-10-12 DIAGNOSIS — L820 Inflamed seborrheic keratosis: Secondary | ICD-10-CM | POA: Diagnosis not present

## 2019-10-12 DIAGNOSIS — L814 Other melanin hyperpigmentation: Secondary | ICD-10-CM | POA: Diagnosis not present

## 2019-10-12 DIAGNOSIS — L438 Other lichen planus: Secondary | ICD-10-CM | POA: Diagnosis not present

## 2019-10-16 ENCOUNTER — Ambulatory Visit: Payer: Medicare Other | Admitting: Orthopedic Surgery

## 2019-10-25 DIAGNOSIS — I10 Essential (primary) hypertension: Secondary | ICD-10-CM | POA: Diagnosis not present

## 2019-10-25 DIAGNOSIS — E7800 Pure hypercholesterolemia, unspecified: Secondary | ICD-10-CM | POA: Diagnosis not present

## 2019-10-25 DIAGNOSIS — F4321 Adjustment disorder with depressed mood: Secondary | ICD-10-CM | POA: Diagnosis not present

## 2019-10-25 DIAGNOSIS — E039 Hypothyroidism, unspecified: Secondary | ICD-10-CM | POA: Diagnosis not present

## 2019-11-10 DIAGNOSIS — M1712 Unilateral primary osteoarthritis, left knee: Secondary | ICD-10-CM | POA: Diagnosis not present

## 2019-11-10 DIAGNOSIS — M5416 Radiculopathy, lumbar region: Secondary | ICD-10-CM | POA: Diagnosis not present

## 2019-11-29 DIAGNOSIS — Z23 Encounter for immunization: Secondary | ICD-10-CM | POA: Diagnosis not present

## 2020-01-02 ENCOUNTER — Other Ambulatory Visit: Payer: Self-pay | Admitting: Family Medicine

## 2020-01-02 DIAGNOSIS — Z1231 Encounter for screening mammogram for malignant neoplasm of breast: Secondary | ICD-10-CM

## 2020-01-08 ENCOUNTER — Ambulatory Visit
Admission: RE | Admit: 2020-01-08 | Discharge: 2020-01-08 | Disposition: A | Discharge status: Home / Self Care | Payer: Medicare Other | Source: Ambulatory Visit | Attending: Family Medicine | Admitting: Family Medicine

## 2020-01-08 ENCOUNTER — Other Ambulatory Visit: Payer: Self-pay

## 2020-01-08 DIAGNOSIS — Z1231 Encounter for screening mammogram for malignant neoplasm of breast: Secondary | ICD-10-CM

## 2020-01-08 IMAGING — MG DIGITAL SCREENING BILAT W/ TOMO W/ CAD
8 series · 8 of 24 positions shown · non-contrast
Comparison: Previous exam(s).

CLINICAL DATA: Screening.

EXAM:
DIGITAL SCREENING BILATERAL MAMMOGRAM WITH TOMO AND CAD

[R CC synth-2D]
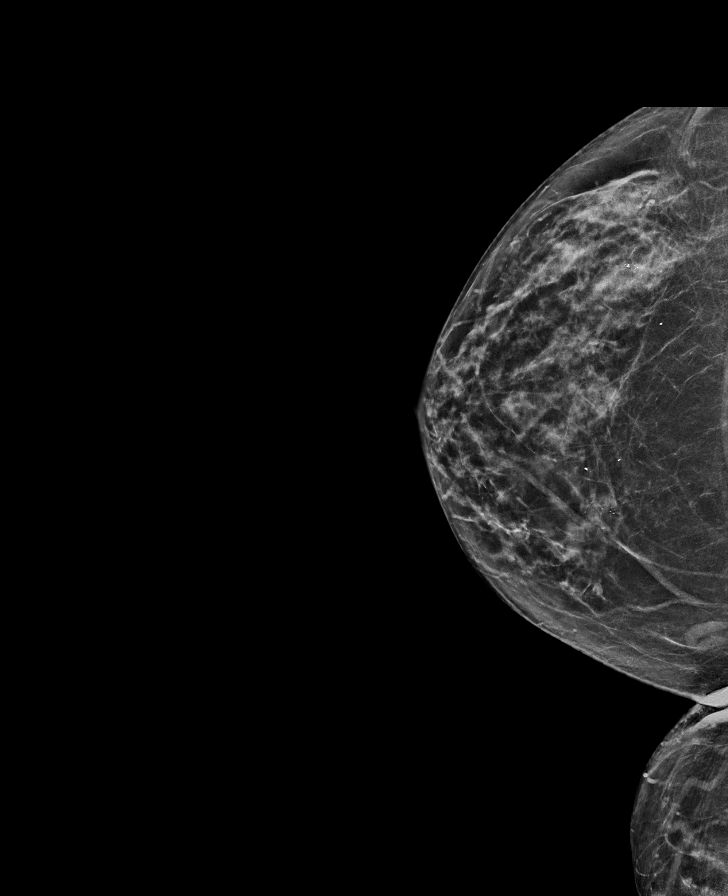

[R MLO synth-2D]
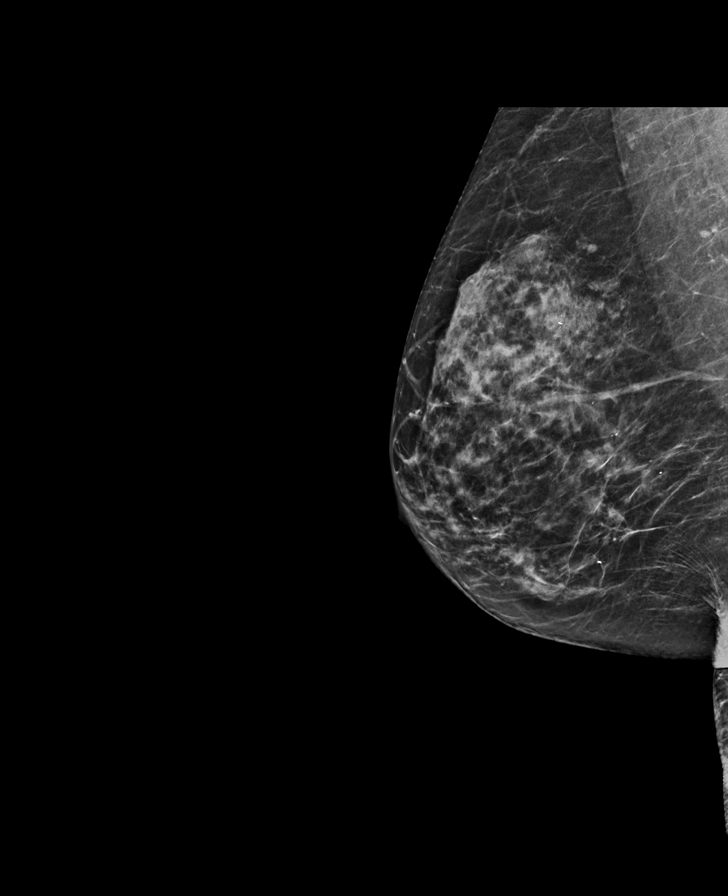

[L MLO synth-2D]
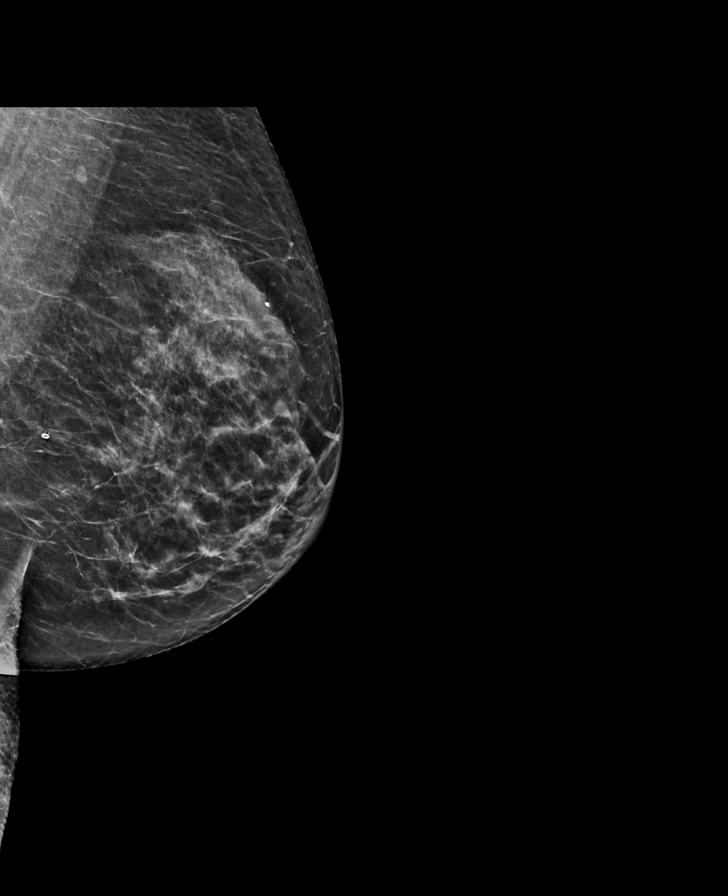

[L CC synth-2D]
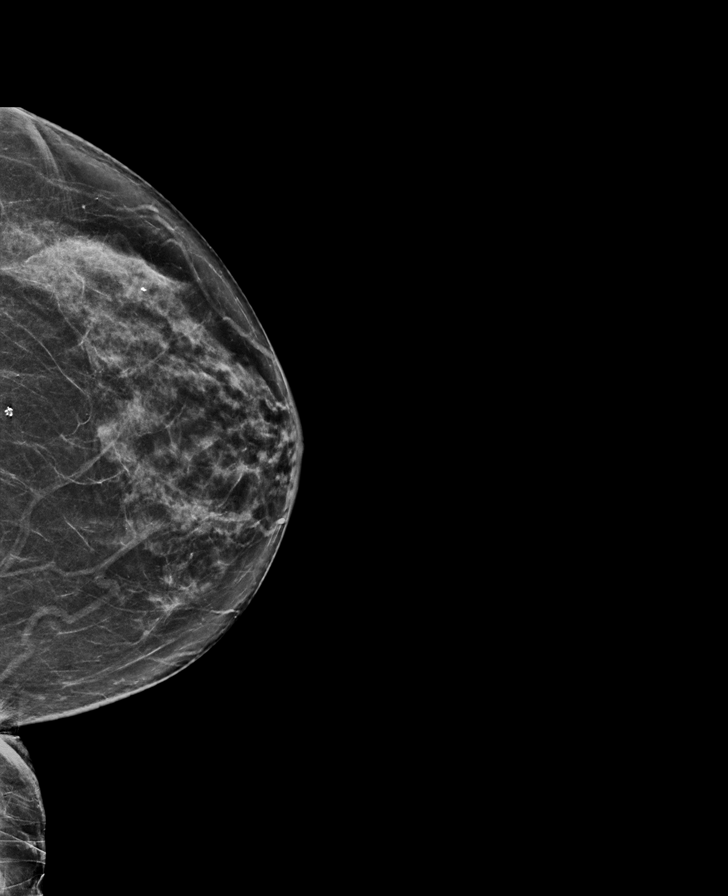

[L MLO tomo · tomo slice 33/64.0]
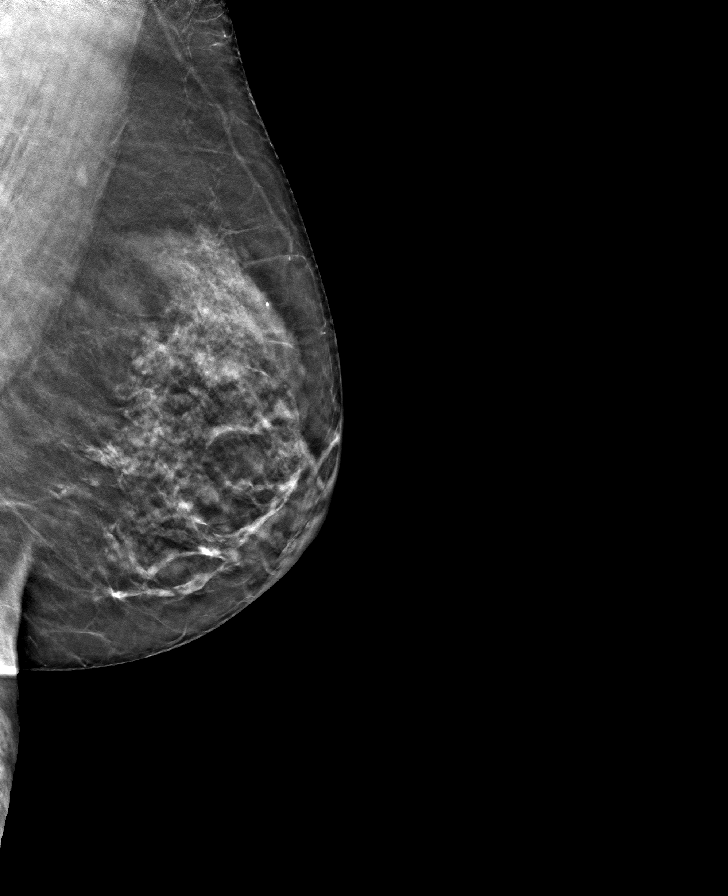

[R MLO tomo · tomo slice 31/60.0]
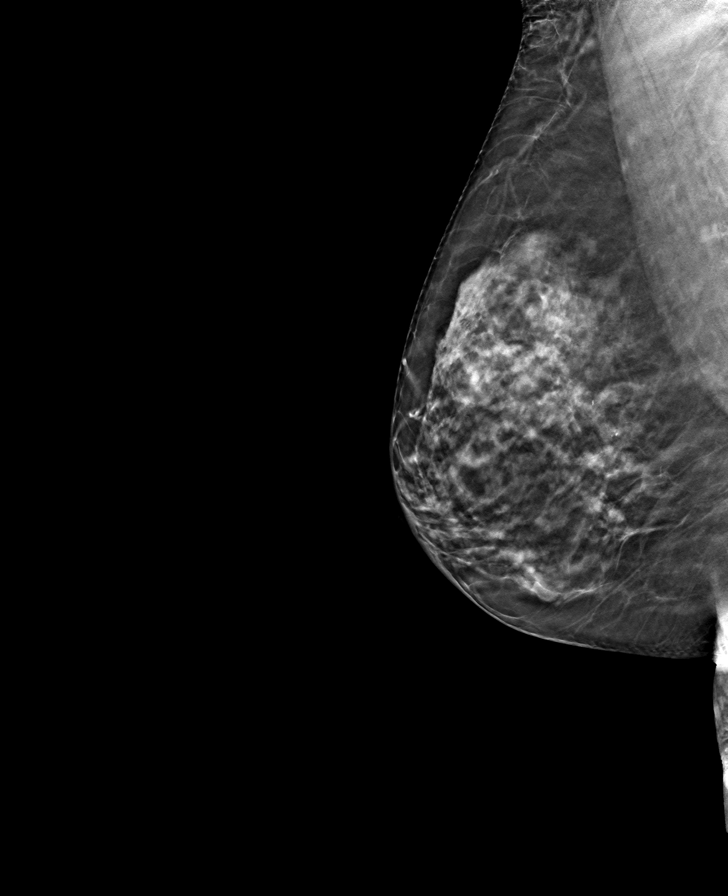

[L CC tomo · tomo slice 35/68.0]
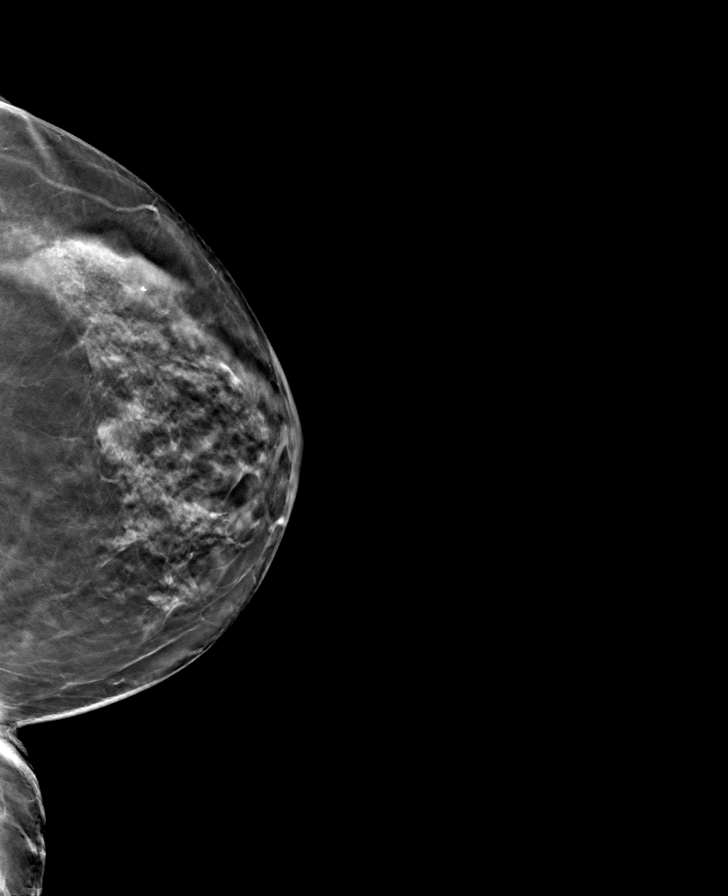

[R CC tomo · tomo slice 32/63.0]
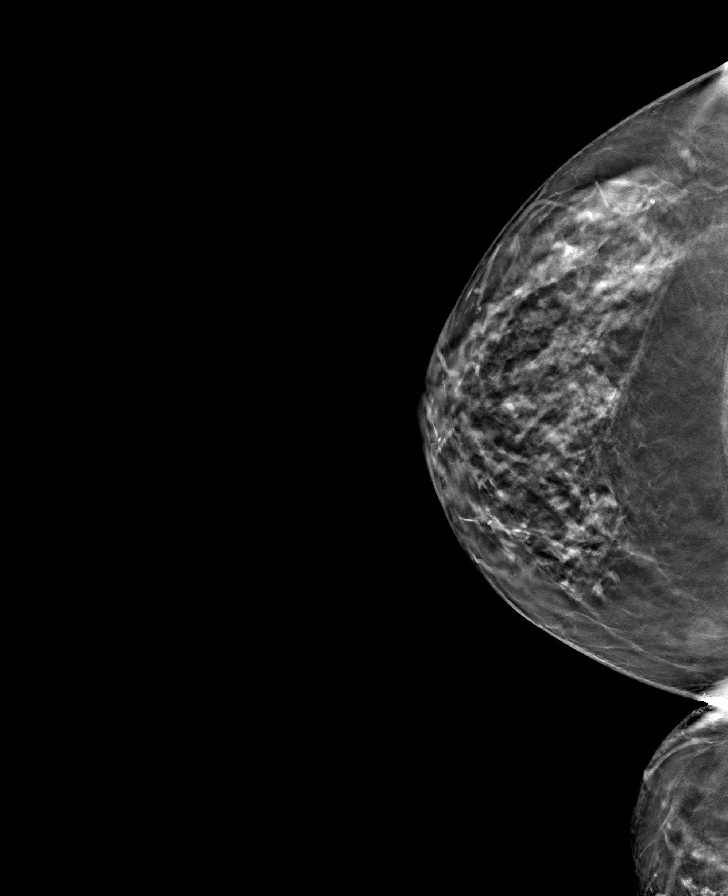

[8 of 24 positions shown; findings below may reference images not displayed]

ACR Breast Density Category c: The breast tissue is heterogeneously
dense, which may obscure small masses.
FINDINGS: In the right breast, possible distortion warrants further
evaluation. In the left breast, no findings suspicious for
malignancy. Images were processed with CAD.
IMPRESSION: Further evaluation is suggested for possible distortion in the right
breast.

RECOMMENDATION:
Diagnostic mammogram and possibly ultrasound of the right breast.
(Code:[WS])

The patient will be contacted regarding the findings, and additional
imaging will be scheduled.

BI-RADS CATEGORY  0: Incomplete. Need additional imaging evaluation
and/or prior mammograms for comparison.

## 2020-01-12 ENCOUNTER — Other Ambulatory Visit: Payer: Self-pay | Admitting: Family Medicine

## 2020-01-12 DIAGNOSIS — R928 Other abnormal and inconclusive findings on diagnostic imaging of breast: Secondary | ICD-10-CM

## 2020-01-22 ENCOUNTER — Ambulatory Visit
Admission: RE | Admit: 2020-01-22 | Discharge: 2020-01-22 | Disposition: A | Discharge status: Home / Self Care | Payer: Medicare Other | Source: Ambulatory Visit | Attending: Family Medicine | Admitting: Family Medicine

## 2020-01-22 ENCOUNTER — Other Ambulatory Visit: Payer: Self-pay

## 2020-01-22 DIAGNOSIS — N6489 Other specified disorders of breast: Secondary | ICD-10-CM | POA: Diagnosis not present

## 2020-01-22 DIAGNOSIS — R922 Inconclusive mammogram: Secondary | ICD-10-CM | POA: Diagnosis not present

## 2020-01-22 DIAGNOSIS — R928 Other abnormal and inconclusive findings on diagnostic imaging of breast: Secondary | ICD-10-CM

## 2020-01-22 IMAGING — MG MM DIGITAL DIAGNOSTIC UNILAT*R* W/ TOMO W/ CAD
8 series · 8 of 24 positions shown · non-contrast
Comparison: Previous exam(s).

CLINICAL DATA: Recall from screening mammography with
tomosynthesis, possible subtle architectural distortion involving
the UPPER RIGHT breast at POSTERIOR depth.

EXAM:
DIGITAL DIAGNOSTIC RIGHT MAMMOGRAM WITH CAD AND TOMO
ULTRASOUND RIGHT BREAST

[R ML synth-2D]
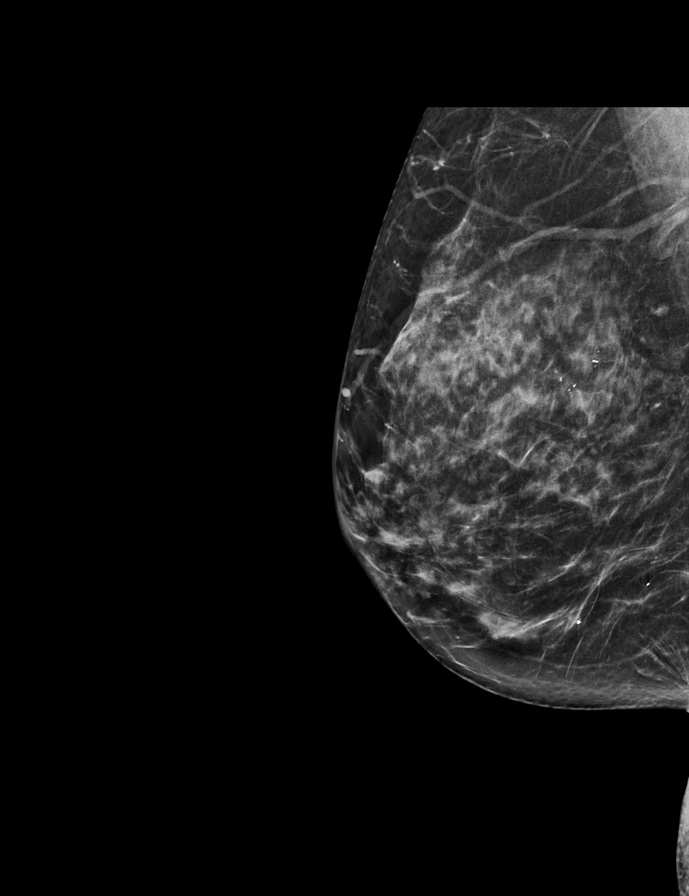

[R CC synth-2D (1 of 2)]
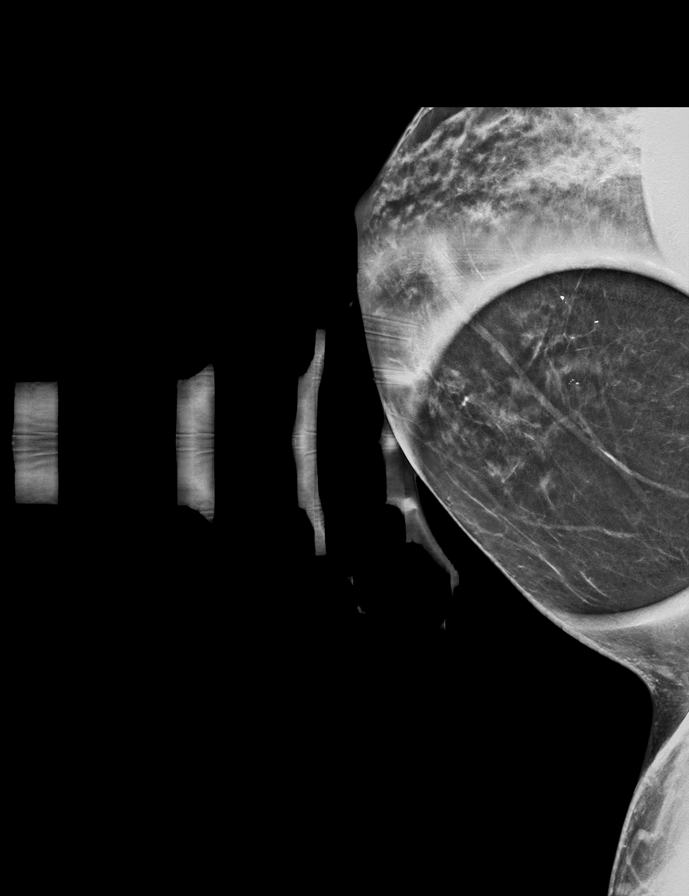

[R CC synth-2D (2 of 2)]
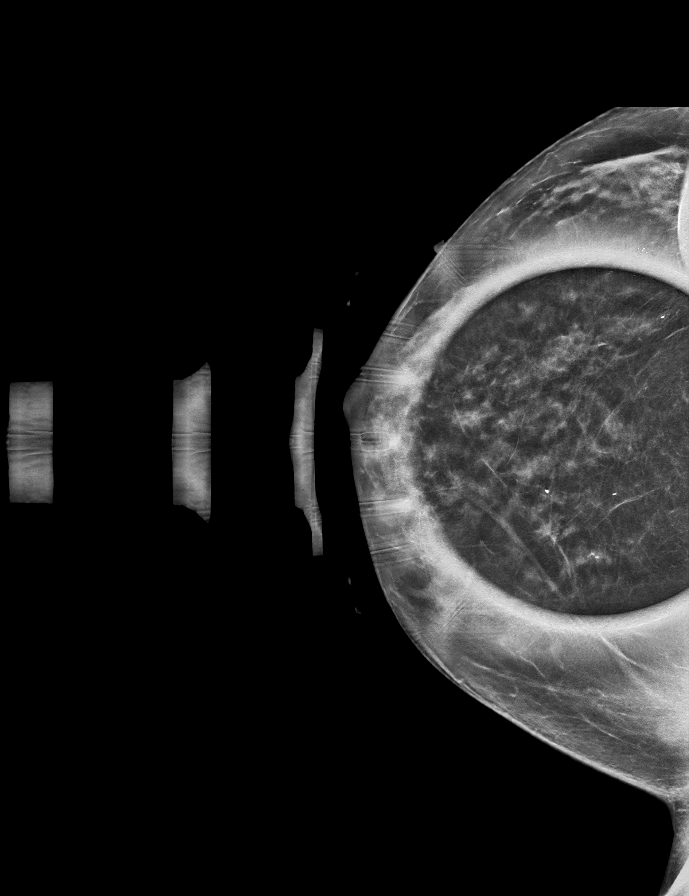

[R MLO synth-2D]
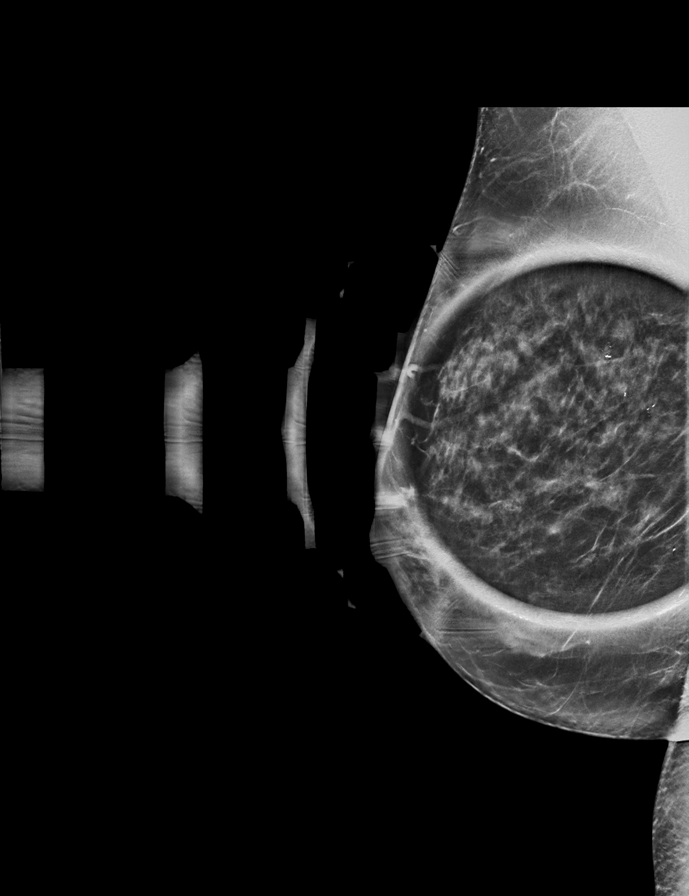

[R MLO tomo · tomo slice 30/59.0]
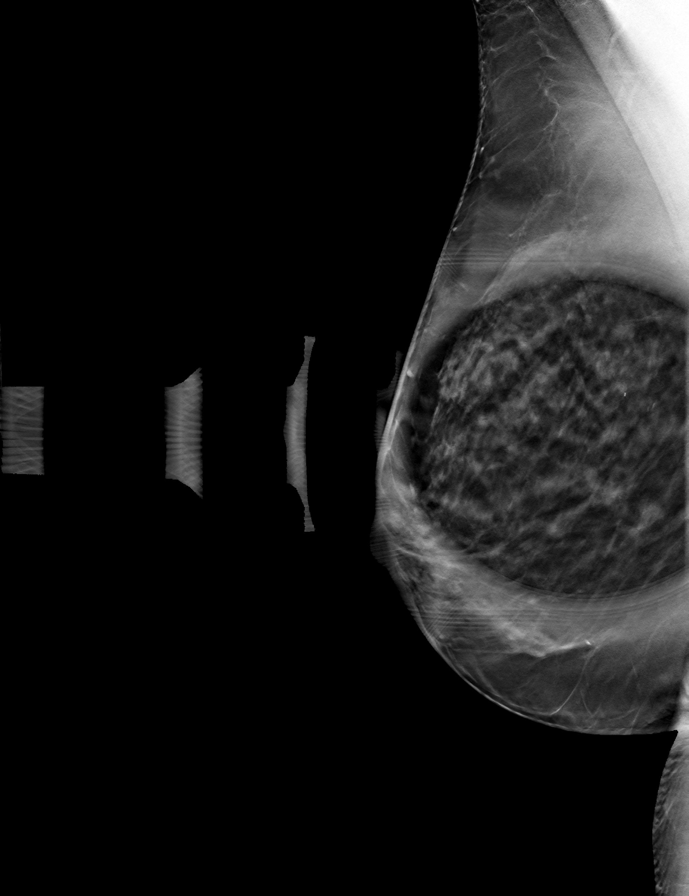

[R ML tomo · tomo slice 30/59.0]
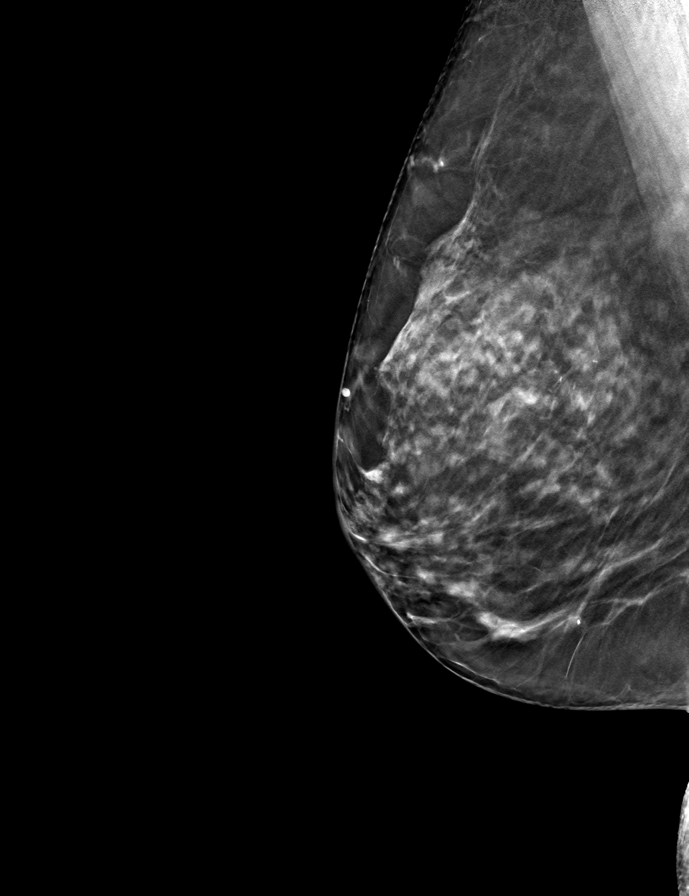

[R CC tomo (1 of 2) · tomo slice 25/50.0]
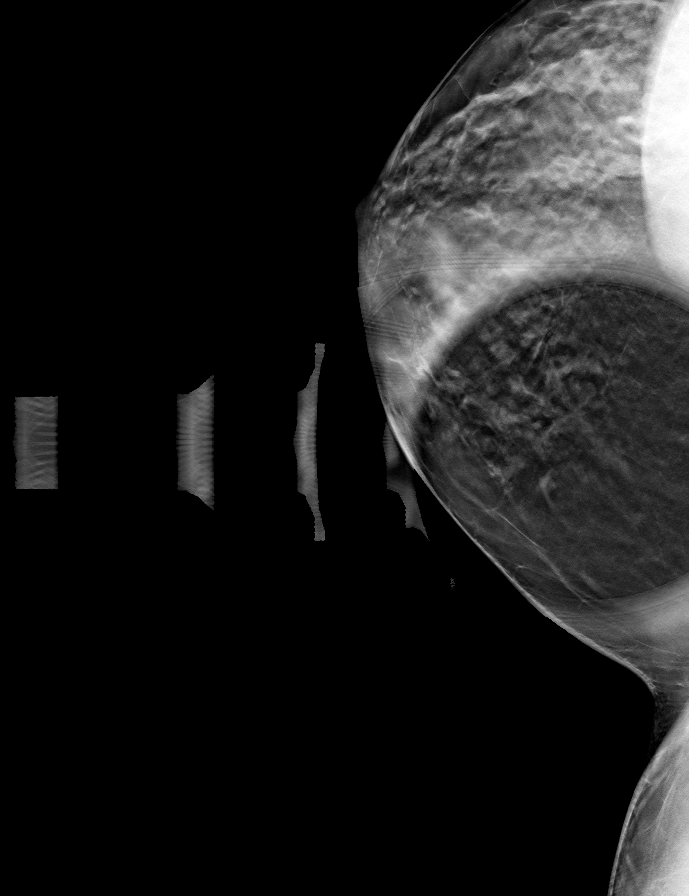

[R CC tomo (2 of 2) · tomo slice 27/54.0]
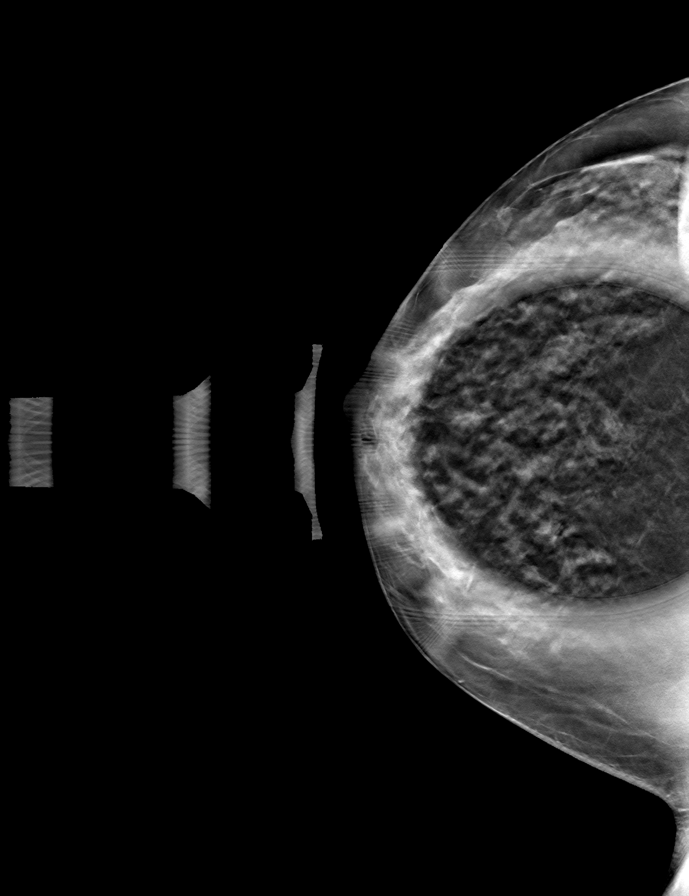

[8 of 24 positions shown; findings below may reference images not displayed]

ACR Breast Density Category c: The breast tissue is heterogeneously
dense, which may obscure small masses.
FINDINGS: Tomosynthesis and synthesized spot-compression CC and MLO views of
the area of concern in the RIGHT breast and a tomosynthesis and
synthesized full field mediolateral of the RIGHT breast were
obtained. The full field mediolateral image was processed with CAD.

There is no persistent distortion on the spot compression images.
The area of concern on screening mammography corresponds to
overlapping fibroglandular tissue and overlapping Cooper's
ligaments. There is no underlying mass or suspicious calcifications.

No findings suspicious malignancy on the full field mediolateral
images.

Targeted ultrasound is performed, showing normal dense
fibroglandular tissue and normal Cooper's ligaments in the upper and
inner breast with imaging from 11 o'clock through 12 o'clock to 4
o'clock. No suspicious solid mass or abnormal acoustic shadowing is
identified.
IMPRESSION: No mammographic or sonographic evidence of malignancy involving the
RIGHT breast.

RECOMMENDATION:
Screening mammogram in one year.(Code:[YT])

I have discussed the findings and recommendations with the patient.
If applicable, a reminder letter will be sent to the patient
regarding the next appointment.

BI-RADS CATEGORY  1: Negative.

## 2020-01-22 IMAGING — US US BREAST*R* LIMITED INC AXILLA
1 series · 6 of 6 positions shown · non-contrast
Comparison: Previous exam(s).

CLINICAL DATA: Recall from screening mammography with
tomosynthesis, possible subtle architectural distortion involving
the UPPER RIGHT breast at POSTERIOR depth.

EXAM:
DIGITAL DIAGNOSTIC RIGHT MAMMOGRAM WITH CAD AND TOMO
ULTRASOUND RIGHT BREAST

[Series 1: us breast*right* limited inc axilla · 0.06mm/px · 6 of 6 slices shown]
[im 1/6]
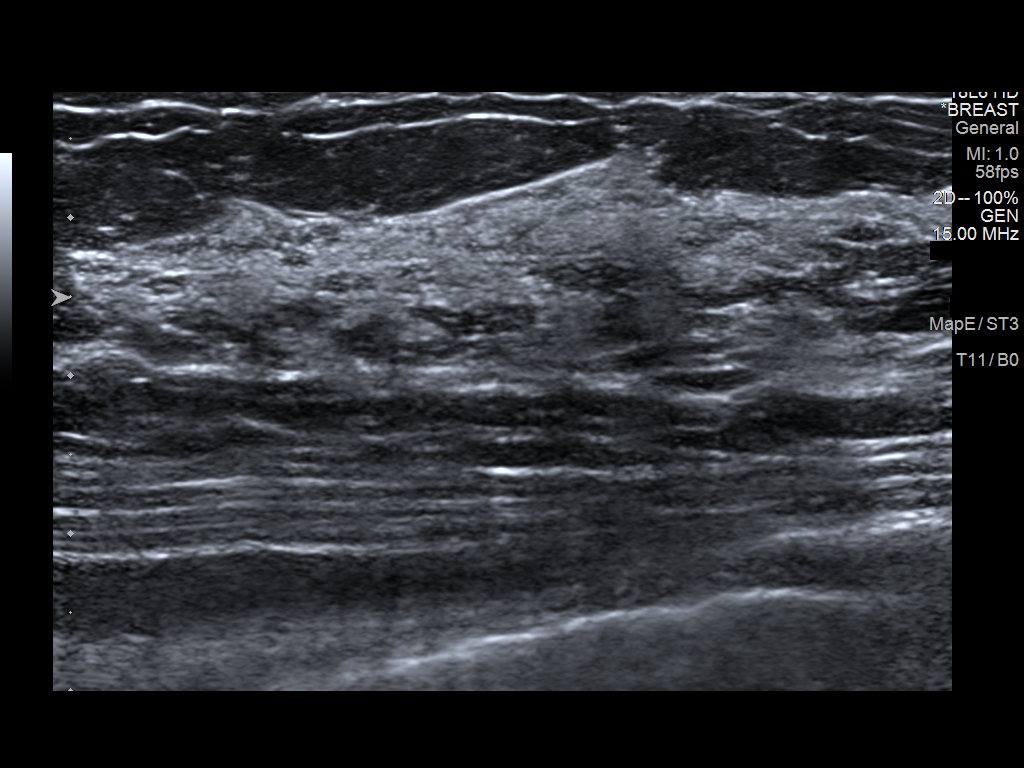
[im 2/6]
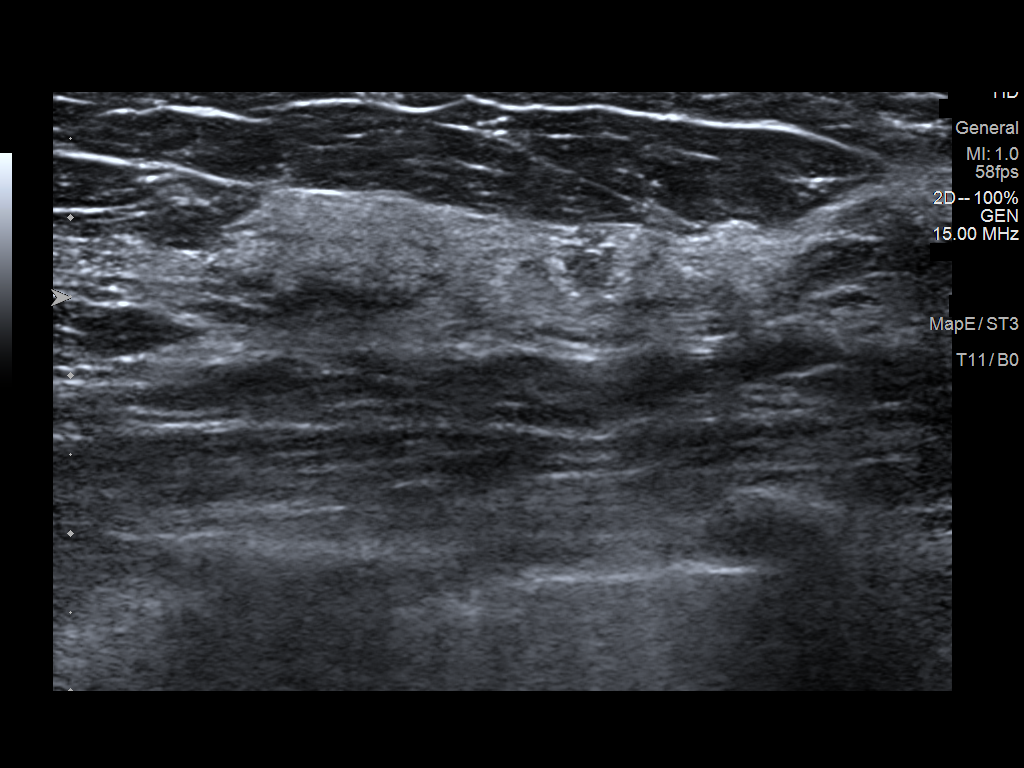
[im 3/6]
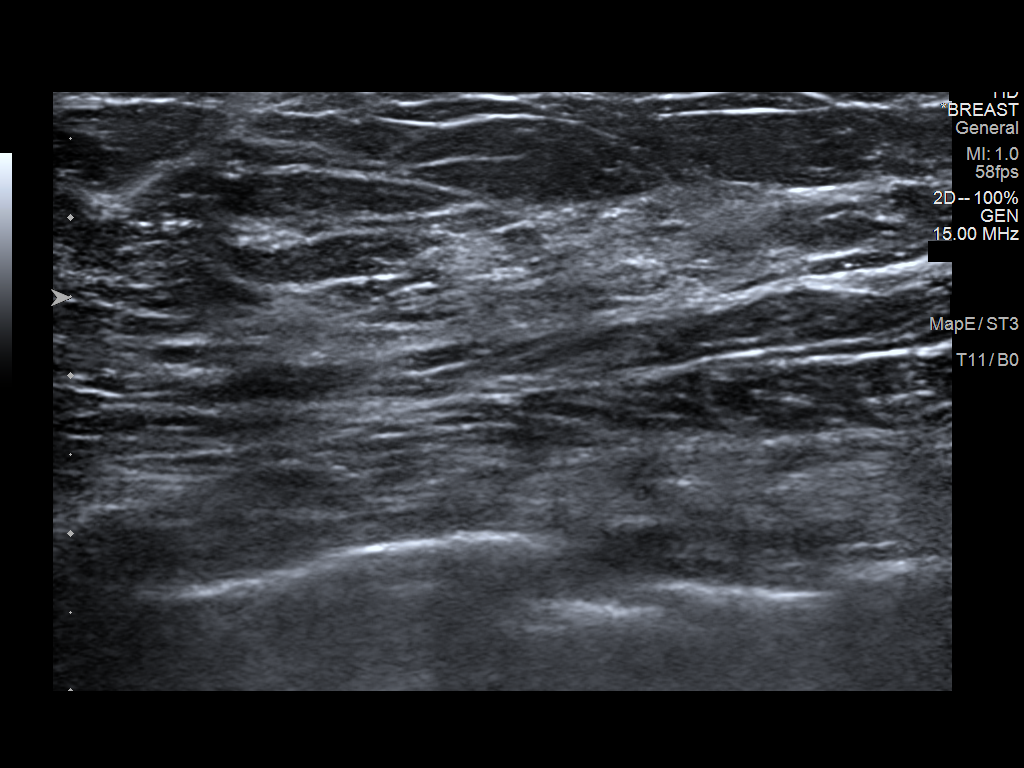
[im 4/6]
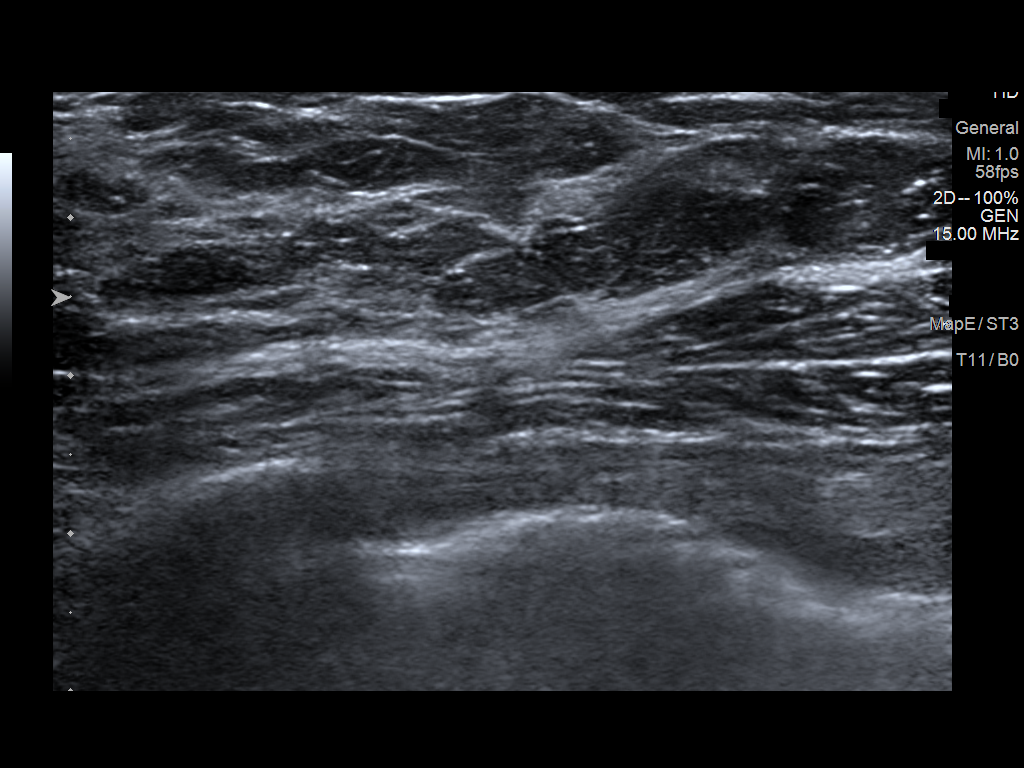
[im 5/6]
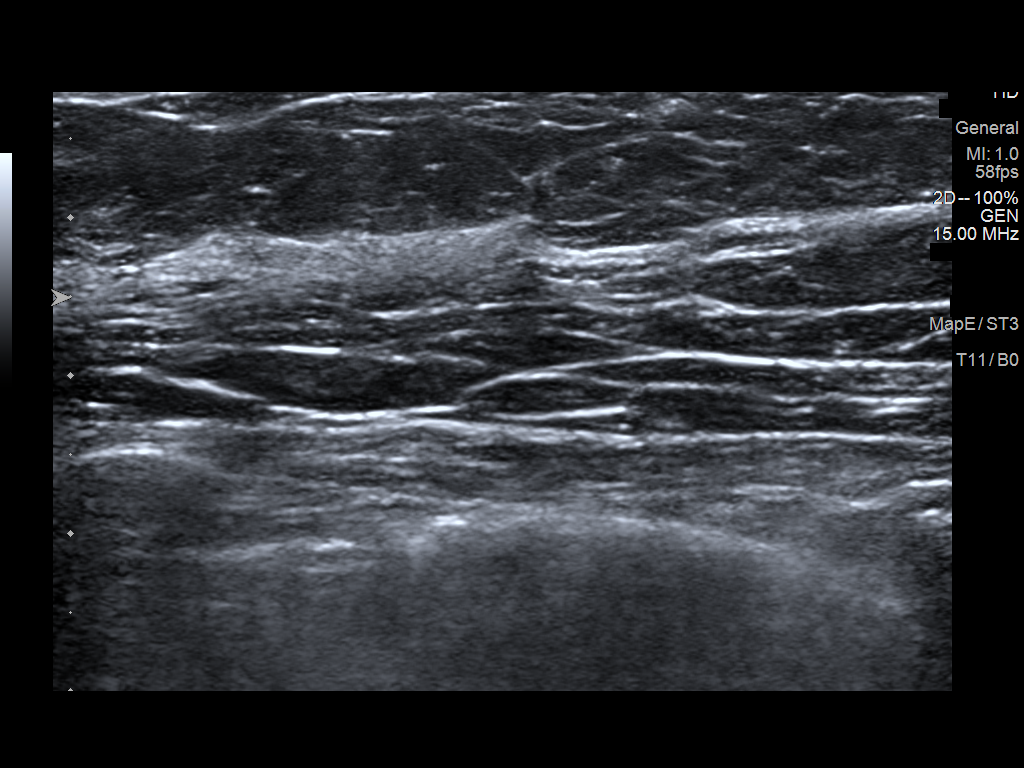
[im 6/6]
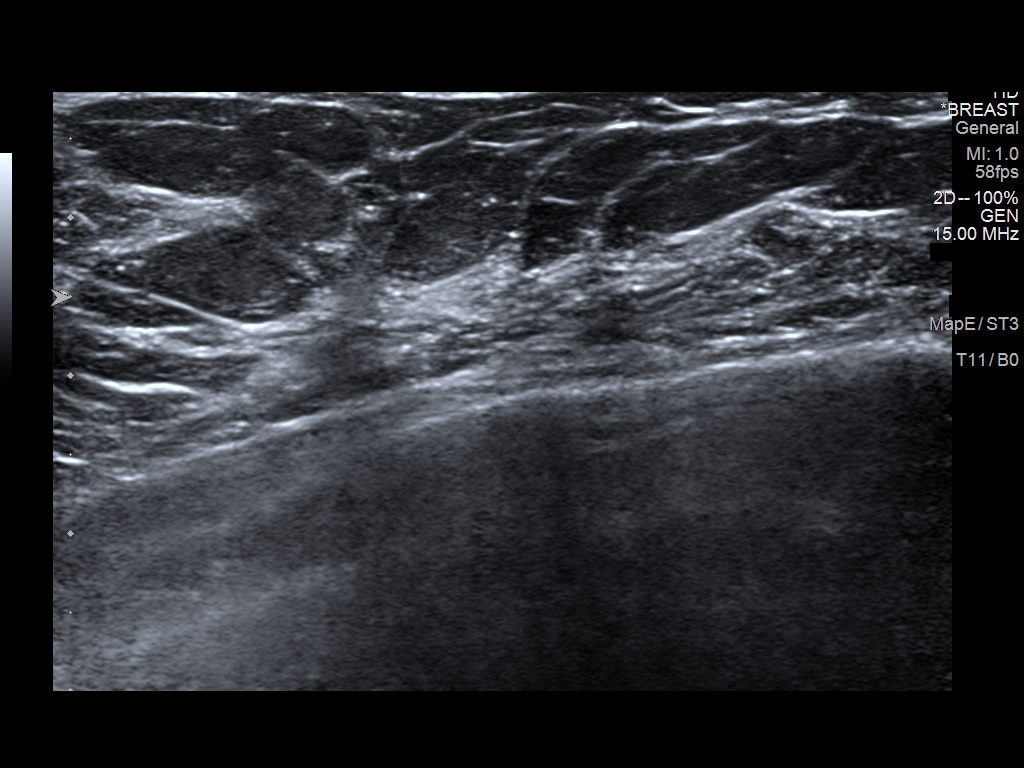

[6 of 6 positions shown; findings below may reference images not displayed]

ACR Breast Density Category c: The breast tissue is heterogeneously
dense, which may obscure small masses.
FINDINGS: Tomosynthesis and synthesized spot-compression CC and MLO views of
the area of concern in the RIGHT breast and a tomosynthesis and
synthesized full field mediolateral of the RIGHT breast were
obtained. The full field mediolateral image was processed with CAD.

There is no persistent distortion on the spot compression images.
The area of concern on screening mammography corresponds to
overlapping fibroglandular tissue and overlapping Cooper's
ligaments. There is no underlying mass or suspicious calcifications.

No findings suspicious malignancy on the full field mediolateral
images.

Targeted ultrasound is performed, showing normal dense
fibroglandular tissue and normal Cooper's ligaments in the upper and
inner breast with imaging from 11 o'clock through 12 o'clock to 4
o'clock. No suspicious solid mass or abnormal acoustic shadowing is
identified.
IMPRESSION: No mammographic or sonographic evidence of malignancy involving the
RIGHT breast.

RECOMMENDATION:
Screening mammogram in one year.(Code:[YT])

I have discussed the findings and recommendations with the patient.
If applicable, a reminder letter will be sent to the patient
regarding the next appointment.

BI-RADS CATEGORY  1: Negative.

## 2020-01-23 DIAGNOSIS — M1712 Unilateral primary osteoarthritis, left knee: Secondary | ICD-10-CM | POA: Diagnosis not present

## 2020-04-01 DIAGNOSIS — M1712 Unilateral primary osteoarthritis, left knee: Secondary | ICD-10-CM | POA: Diagnosis not present

## 2020-05-28 DIAGNOSIS — F4321 Adjustment disorder with depressed mood: Secondary | ICD-10-CM | POA: Diagnosis not present

## 2020-05-28 DIAGNOSIS — I10 Essential (primary) hypertension: Secondary | ICD-10-CM | POA: Diagnosis not present

## 2020-05-28 DIAGNOSIS — E7800 Pure hypercholesterolemia, unspecified: Secondary | ICD-10-CM | POA: Diagnosis not present

## 2020-05-28 DIAGNOSIS — E039 Hypothyroidism, unspecified: Secondary | ICD-10-CM | POA: Diagnosis not present

## 2020-05-30 DIAGNOSIS — L570 Actinic keratosis: Secondary | ICD-10-CM | POA: Diagnosis not present

## 2020-05-30 DIAGNOSIS — L820 Inflamed seborrheic keratosis: Secondary | ICD-10-CM | POA: Diagnosis not present

## 2020-05-30 DIAGNOSIS — S20461A Insect bite (nonvenomous) of right back wall of thorax, initial encounter: Secondary | ICD-10-CM | POA: Diagnosis not present

## 2020-05-30 DIAGNOSIS — D1801 Hemangioma of skin and subcutaneous tissue: Secondary | ICD-10-CM | POA: Diagnosis not present

## 2020-06-05 ENCOUNTER — Ambulatory Visit: Payer: Medicare Other | Admitting: Family Medicine

## 2020-06-05 ENCOUNTER — Other Ambulatory Visit: Payer: Self-pay

## 2020-06-05 VITALS — BP 110/78 | Ht 62.0 in | Wt 143.0 lb

## 2020-06-05 DIAGNOSIS — M1712 Unilateral primary osteoarthritis, left knee: Secondary | ICD-10-CM | POA: Diagnosis not present

## 2020-06-05 DIAGNOSIS — Q667 Congenital pes cavus, unspecified foot: Secondary | ICD-10-CM

## 2020-06-06 ENCOUNTER — Encounter: Payer: Self-pay | Admitting: Family Medicine

## 2020-06-06 NOTE — Progress Notes (Signed)
PCP: Merri Brunette, MD  Subjective:   HPI: Patient is a 75 y.o. female here for custom orthotics.  Patient has been seeing Dr. 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 Recently had gel shot in this knee. She is planning to do a 200km walk over 2 weeks at the end of this month so is here to get custom orthotics.  History reviewed. No pertinent past medical history.  Current Outpatient Medications on File Prior to Visit  Medication Sig Dispense Refill  . gabapentin (NEURONTIN) 100 MG capsule Take 100 mg by mouth daily.    Marland Kitchen lisinopril-hydrochlorothiazide (ZESTORETIC) 10-12.5 MG tablet Take 1 tablet by mouth daily.    . Multiple Vitamin (MULTIVITAMIN) tablet Take 1 tablet by mouth daily.    . sertraline (ZOLOFT) 50 MG tablet Take 1 tablet by mouth daily.    Marland Kitchen SYNTHROID 25 MCG tablet Take 25 mcg by mouth every morning.    . Thiamine HCl (VITAMIN B-1 PO) Take 1 tablet by mouth daily.    Marland Kitchen VITAMIN D PO Take 1 capsule by mouth daily.     No current facility-administered medications on file prior to visit.    History reviewed. No pertinent surgical history.  No Known Allergies  Social History   Socioeconomic History  . Marital status: Widowed    Spouse name: Not on file  . Number of children: Not on file  . Years of education: Not on file  . Highest education level: Not on file  Occupational History  . Not on file  Tobacco Use  . Smoking status: Never Smoker  . Smokeless tobacco: Never Used  Substance and Sexual Activity  . Alcohol use: Not on file  . Drug use: Not on file  . Sexual activity: Not on file  Other Topics Concern  . Not on file  Social History Narrative  . Not on file   Social Determinants of Health   Financial Resource Strain: Not on file  Food Insecurity: Not on file  Transportation Needs: Not on file  Physical Activity: Not on file  Stress: Not on file  Social Connections: Not on file  Intimate Partner Violence: Not on file    Family  History  Problem Relation Age of Onset  . Breast cancer Neg Hx     BP 110/78   Ht 5\' 2"  (1.575 m)   Wt 143 lb (64.9 kg)   BMI 26.16 kg/m   No flowsheet data found.  No flowsheet data found.  Review of Systems: See HPI above.     Objective:  Physical Exam:  Gen: NAD, comfortable in exam room  Right foot/ankle: Pes cavus.  Mild hallux rigidus.  Transverse arch collapse with callus under 2nd-4th MT heads.  No other gross deformity, swelling, ecchymoses FROM ankle without pain. No TTP negative ant drawer and negative talar tilt.   Thompsons test negative. NV intact distally. No leg length inequality.  Left foot/ankle: Pes cavus.  Mild hallux rigidus.  Transverse arch collapse with callus under 2nd-4th MT heads.  No other gross deformity, swelling, ecchymoses FROM ankle without pain. No TTP negative ant drawer and negative talar tilt.   Thompsons test negative. NV intact distally.   Assessment & Plan:  1. Pes cavus - custom orthotics made today and felt comfortable to patient.  Patient was fitted for a : standard, cushioned, semi-rigid orthotic. The orthotic was heated and afterward the patient stood on the orthotic blank positioned on the orthotic stand. The patient was positioned  in subtalar neutral position and 10 degrees of ankle dorsiflexion in a weight bearing stance. After completion of molding, a stable base was applied to the orthotic blank. The blank was ground to a stable position for weight bearing. Size: 9  Base: blue med density eva Posting: none Additional orthotic padding: none

## 2020-06-27 DIAGNOSIS — M1712 Unilateral primary osteoarthritis, left knee: Secondary | ICD-10-CM | POA: Diagnosis not present

## 2020-10-04 DIAGNOSIS — M25562 Pain in left knee: Secondary | ICD-10-CM | POA: Diagnosis not present

## 2020-10-22 ENCOUNTER — Ambulatory Visit (HOSPITAL_BASED_OUTPATIENT_CLINIC_OR_DEPARTMENT_OTHER): Payer: Medicare Other | Admitting: Physical Therapy

## 2020-10-23 DIAGNOSIS — L570 Actinic keratosis: Secondary | ICD-10-CM | POA: Diagnosis not present

## 2020-10-23 DIAGNOSIS — L814 Other melanin hyperpigmentation: Secondary | ICD-10-CM | POA: Diagnosis not present

## 2020-10-23 DIAGNOSIS — D1801 Hemangioma of skin and subcutaneous tissue: Secondary | ICD-10-CM | POA: Diagnosis not present

## 2020-10-23 DIAGNOSIS — L821 Other seborrheic keratosis: Secondary | ICD-10-CM | POA: Diagnosis not present

## 2020-10-24 DIAGNOSIS — H25013 Cortical age-related cataract, bilateral: Secondary | ICD-10-CM | POA: Diagnosis not present

## 2020-10-24 DIAGNOSIS — H2513 Age-related nuclear cataract, bilateral: Secondary | ICD-10-CM | POA: Diagnosis not present

## 2020-10-24 DIAGNOSIS — D3131 Benign neoplasm of right choroid: Secondary | ICD-10-CM | POA: Diagnosis not present

## 2020-10-24 DIAGNOSIS — H524 Presbyopia: Secondary | ICD-10-CM | POA: Diagnosis not present

## 2020-11-27 DIAGNOSIS — I10 Essential (primary) hypertension: Secondary | ICD-10-CM | POA: Diagnosis not present

## 2020-11-27 DIAGNOSIS — E039 Hypothyroidism, unspecified: Secondary | ICD-10-CM | POA: Diagnosis not present

## 2020-11-27 DIAGNOSIS — E7800 Pure hypercholesterolemia, unspecified: Secondary | ICD-10-CM | POA: Diagnosis not present

## 2020-11-27 DIAGNOSIS — Z23 Encounter for immunization: Secondary | ICD-10-CM | POA: Diagnosis not present

## 2020-12-23 ENCOUNTER — Other Ambulatory Visit: Payer: Self-pay | Admitting: Student

## 2020-12-23 DIAGNOSIS — M25562 Pain in left knee: Secondary | ICD-10-CM

## 2020-12-24 DIAGNOSIS — R509 Fever, unspecified: Secondary | ICD-10-CM | POA: Diagnosis not present

## 2020-12-25 DIAGNOSIS — M25531 Pain in right wrist: Secondary | ICD-10-CM | POA: Diagnosis not present

## 2020-12-25 DIAGNOSIS — Z4789 Encounter for other orthopedic aftercare: Secondary | ICD-10-CM | POA: Diagnosis not present

## 2020-12-25 DIAGNOSIS — M24131 Other articular cartilage disorders, right wrist: Secondary | ICD-10-CM | POA: Diagnosis not present

## 2020-12-25 DIAGNOSIS — M24139 Other articular cartilage disorders, unspecified wrist: Secondary | ICD-10-CM | POA: Diagnosis not present

## 2021-01-05 ENCOUNTER — Ambulatory Visit
Admission: RE | Admit: 2021-01-05 | Discharge: 2021-01-05 | Disposition: A | Discharge status: Home / Self Care | Payer: Medicare Other | Source: Ambulatory Visit | Attending: Sports Medicine | Admitting: Sports Medicine

## 2021-01-05 ENCOUNTER — Other Ambulatory Visit: Payer: Self-pay

## 2021-01-05 DIAGNOSIS — S83282A Other tear of lateral meniscus, current injury, left knee, initial encounter: Secondary | ICD-10-CM | POA: Diagnosis not present

## 2021-01-05 DIAGNOSIS — M25562 Pain in left knee: Secondary | ICD-10-CM

## 2021-01-05 DIAGNOSIS — R531 Weakness: Secondary | ICD-10-CM | POA: Diagnosis not present

## 2021-01-05 IMAGING — MR MR KNEE*L* W/O CM
4 of 6 series · 23 of 40 positions shown · non-contrast
Comparison: X-ray knee [DATE]; MR knee [DATE].

CLINICAL DATA: Left lateral knee pain with weakness and notes knee
gives out since [DATE].

EXAM:
MRI OF THE LEFT KNEE WITHOUT CONTRAST
TECHNIQUE: Multiplanar, multisequence MR imaging of the knee was performed. No
intravenous contrast was administered.

[Series 7: T2 fat-sat · coronal · 4.5mm · 0.59mm/px · 6 of 23 slices shown (1 of 2)]
[im 1/23]
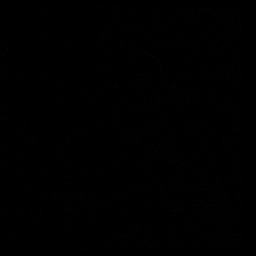
[im 5/23]
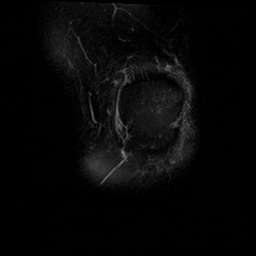
[im 9/23]
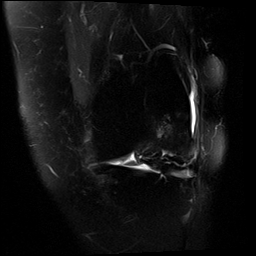
[im 14/23]
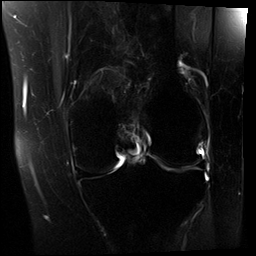
[im 18/23]
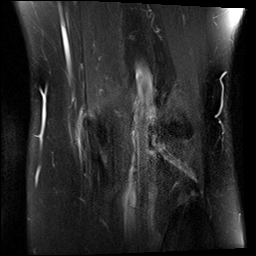
[im 23/23]
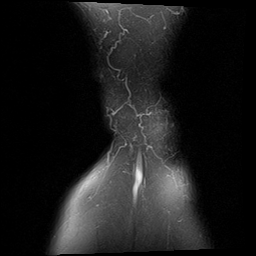

[Series 8: T1 · coronal · 4.5mm · 0.29mm/px · 3 of 23 slices shown]
[im 5/23]
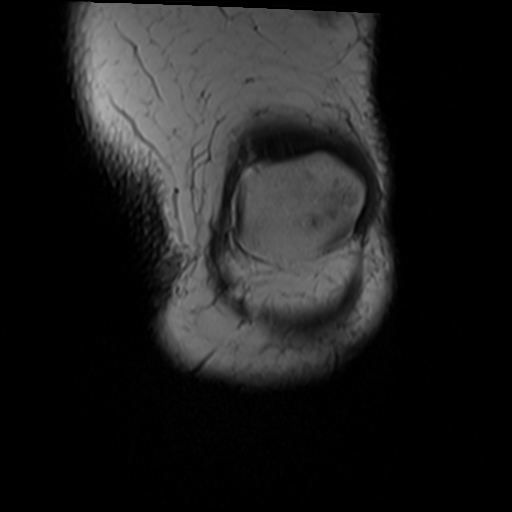
[im 14/23]
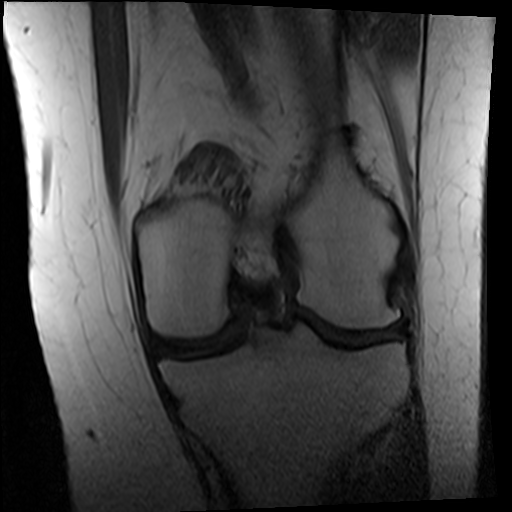
[im 23/23]
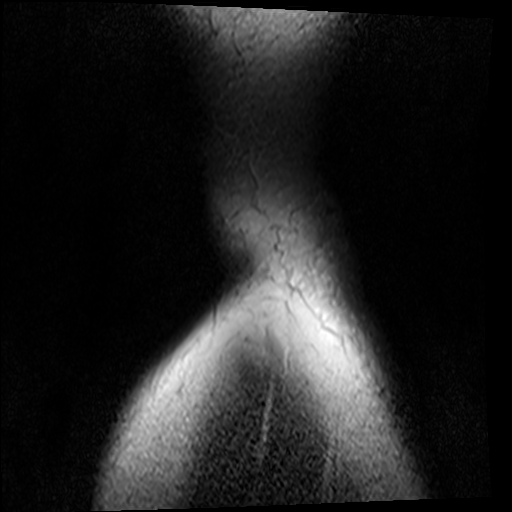

[Series 10: PD fat-sat · sagittal · 3.0mm · 0.29mm/px · 7 of 29 slices shown]
[im 1/29]
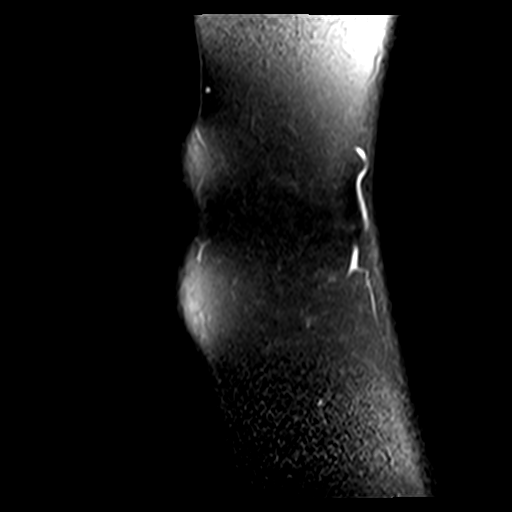
[im 5/29]
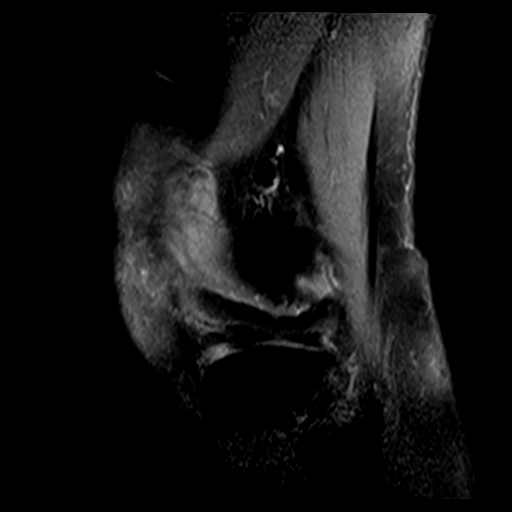
[im 10/29]
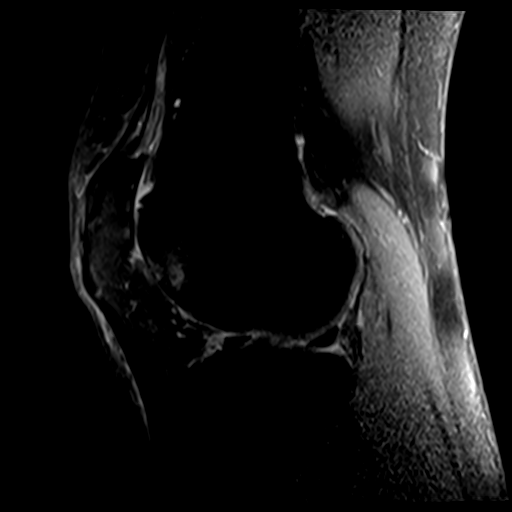
[im 15/29]
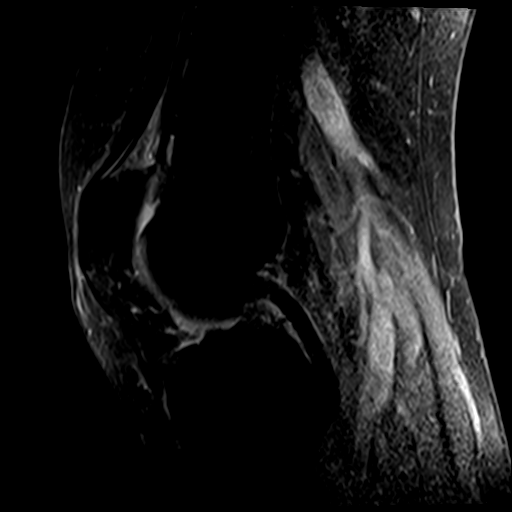
[im 19/29]
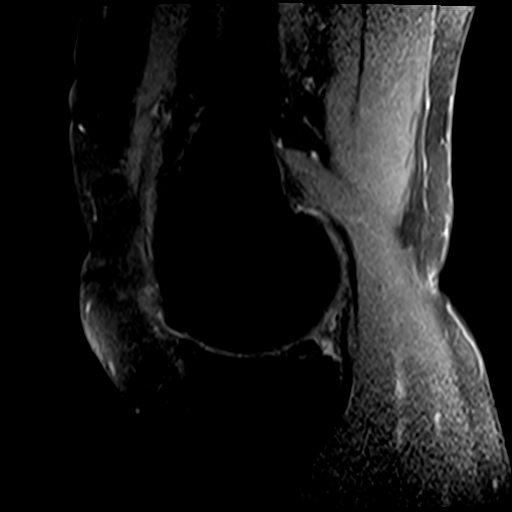
[im 24/29]
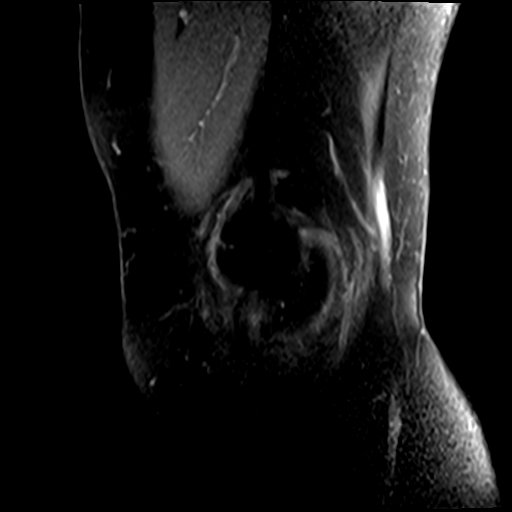
[im 29/29]
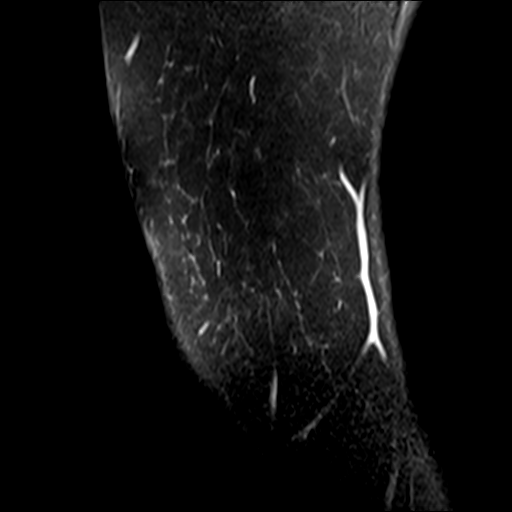

[Series 11: T2 fat-sat · sagittal · 3.0mm · 0.29mm/px · 7 of 29 slices shown (2 of 2)]
[im 1/29]
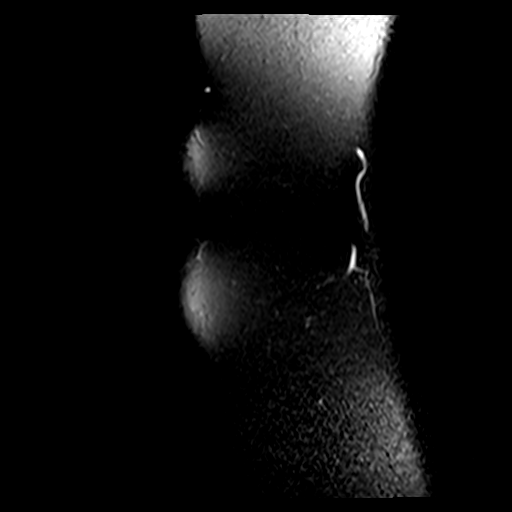
[im 5/29]
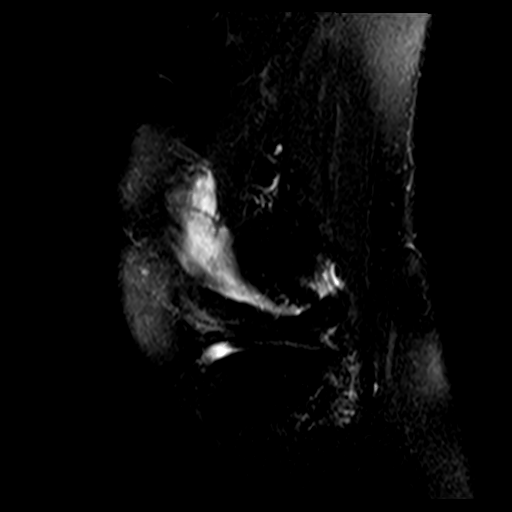
[im 10/29]
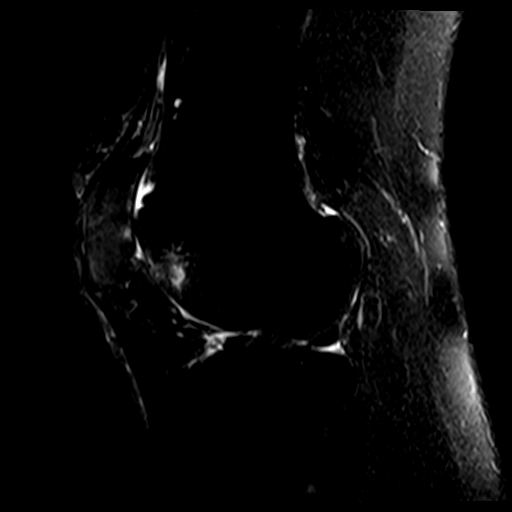
[im 15/29]
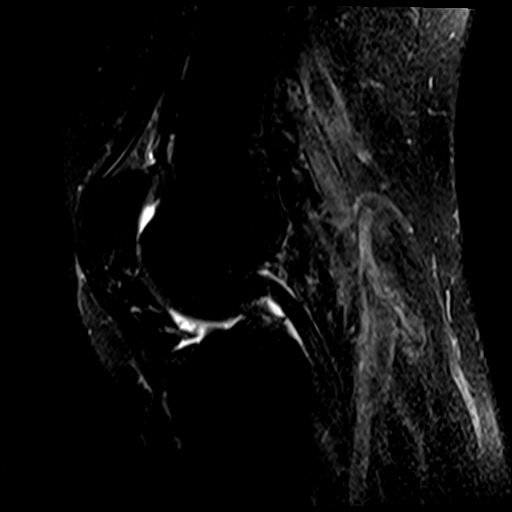
[im 19/29]
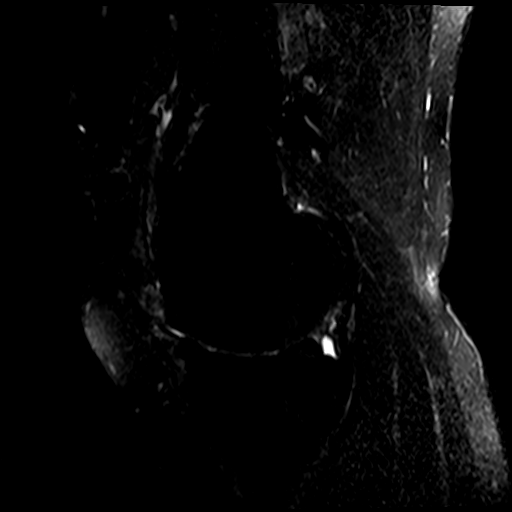
[im 24/29]
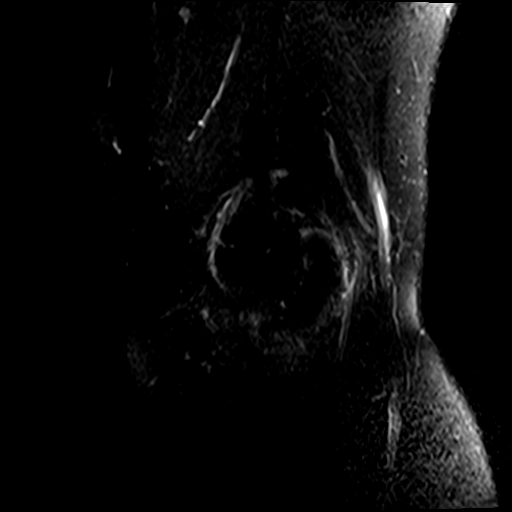
[im 29/29]
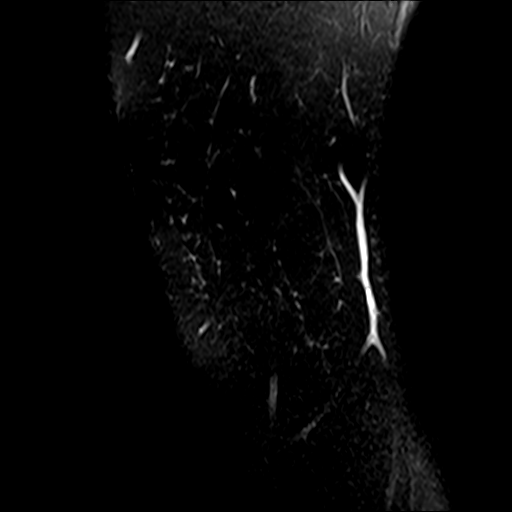

[23 of 40 positions shown; findings below may reference images not displayed]

FINDINGS: MENISCI

Medial meniscus: Intact.

Lateral meniscus: Tiny radial tear of the free edge of the body of
the lateral meniscus.

LIGAMENTS

Cruciates: Intact ACL and PCL.

Collaterals: Medial collateral ligament is intact. Lateral
collateral ligament complex is intact.

CARTILAGE

Patellofemoral: Full-thickness cartilage loss of the lateral
patellofemoral compartment with subchondral reactive marrow edema.
Partial-thickness cartilage loss of the medial patellofemoral
compartment.

Medial: High-grade partial-thickness cartilage loss of the medial
femorotibial compartment.

Lateral: Partial-thickness cartilage loss of the lateral
femorotibial compartment with small areas of high-grade
partial-thickness cartilage loss of the medial aspect of the lateral
tibial plateau.

Joint: No joint effusion. Normal Hoffa's fat. No plical thickening.

Popliteal Fossa:  Tiny Baker's cyst.  Intact popliteus tendon.

Extensor Mechanism: Intact quadriceps tendon and patellar tendon.

Bones:  No acute osseous abnormality.  No aggressive osseous lesion.

Other: None.
IMPRESSION: 1. Tricompartmental cartilage abnormalities as described above, most
severe in the lateral patellofemoral compartment. Overall cartilage
loss has progressed compared with the prior exam.
2. Tiny radial tear of the free edge of the body of the lateral
meniscus.

## 2021-01-16 DIAGNOSIS — M1712 Unilateral primary osteoarthritis, left knee: Secondary | ICD-10-CM | POA: Diagnosis not present

## 2021-01-21 ENCOUNTER — Other Ambulatory Visit: Payer: Self-pay | Admitting: Family Medicine

## 2021-01-21 DIAGNOSIS — E2839 Other primary ovarian failure: Secondary | ICD-10-CM

## 2021-01-21 DIAGNOSIS — M8580 Other specified disorders of bone density and structure, unspecified site: Secondary | ICD-10-CM

## 2021-01-23 DIAGNOSIS — K089 Disorder of teeth and supporting structures, unspecified: Secondary | ICD-10-CM | POA: Diagnosis not present

## 2021-01-23 DIAGNOSIS — Z131 Encounter for screening for diabetes mellitus: Secondary | ICD-10-CM | POA: Diagnosis not present

## 2021-01-30 DIAGNOSIS — M24131 Other articular cartilage disorders, right wrist: Secondary | ICD-10-CM | POA: Diagnosis not present

## 2021-03-25 ENCOUNTER — Other Ambulatory Visit: Payer: Self-pay | Admitting: Sports Medicine

## 2021-03-25 ENCOUNTER — Ambulatory Visit
Admission: RE | Admit: 2021-03-25 | Discharge: 2021-03-25 | Disposition: A | Discharge status: Home / Self Care | Payer: Medicare Other | Source: Ambulatory Visit | Attending: Sports Medicine | Admitting: Sports Medicine

## 2021-03-25 DIAGNOSIS — M25552 Pain in left hip: Secondary | ICD-10-CM | POA: Diagnosis not present

## 2021-03-25 DIAGNOSIS — M1712 Unilateral primary osteoarthritis, left knee: Secondary | ICD-10-CM | POA: Diagnosis not present

## 2021-03-25 IMAGING — CR DG HIP (WITH OR WITHOUT PELVIS) 2-3V*L*
3 series · 3 of 3 positions shown · non-contrast
Comparison: X-ray pelvis [DATE].

CLINICAL DATA: Left lateral hip pain.

EXAM:
DG HIP (WITH OR WITHOUT PELVIS) 2-3V LEFT

[w pelvis upright]
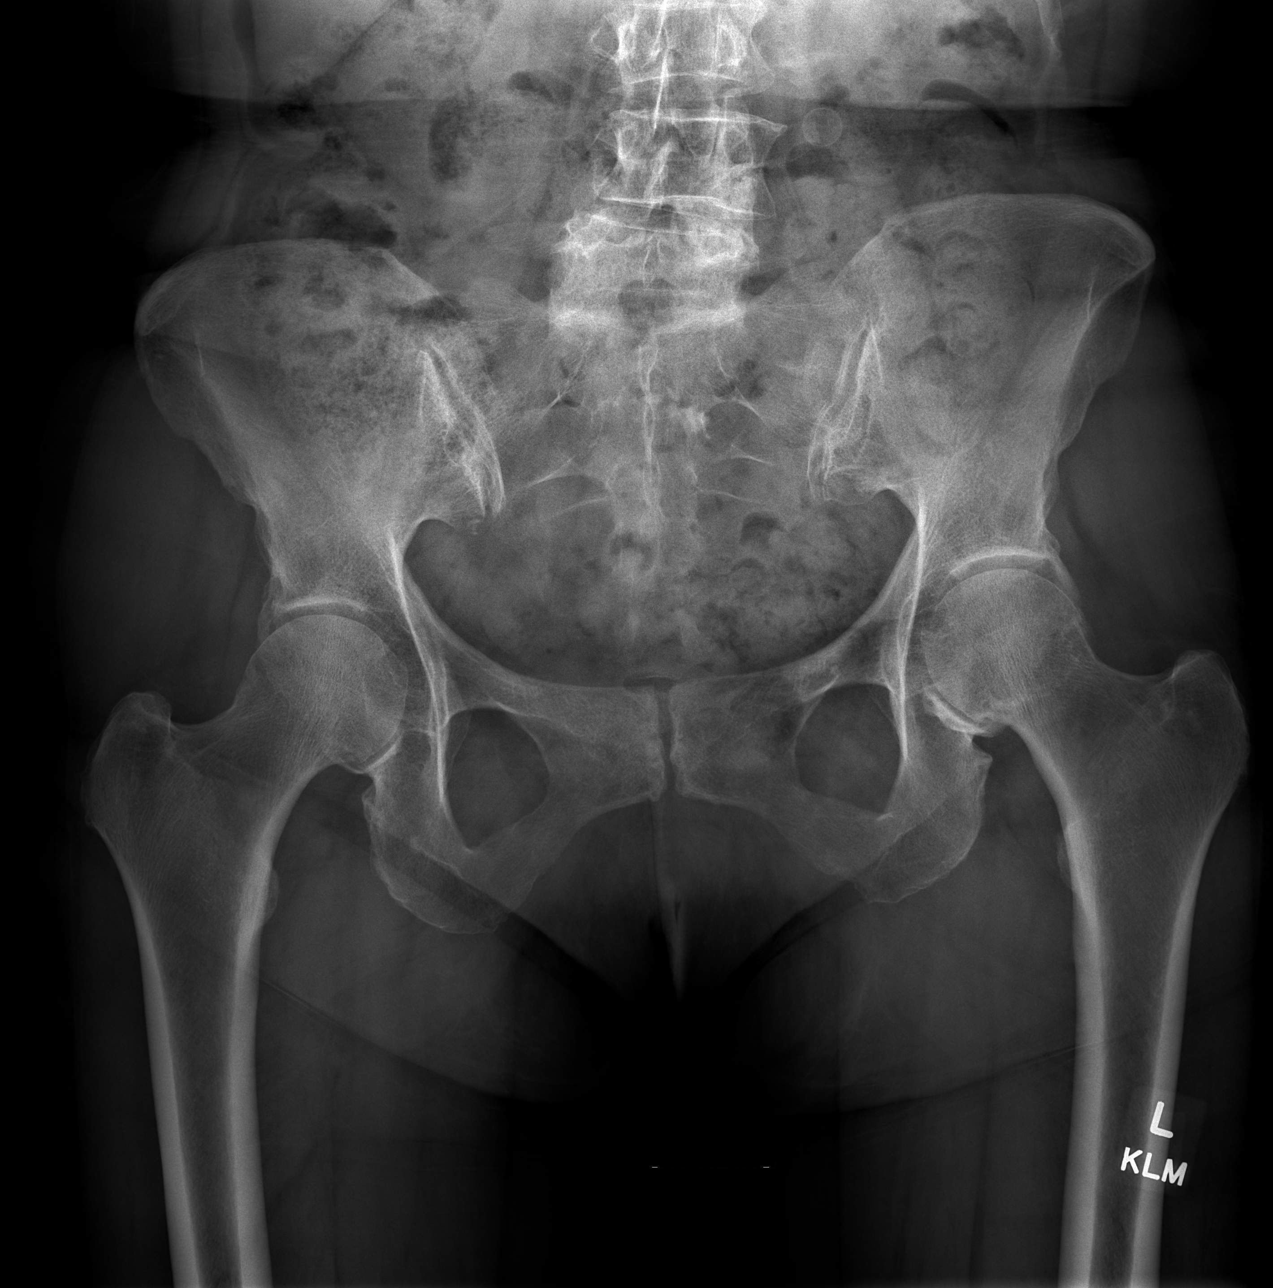

[w hip ap left]
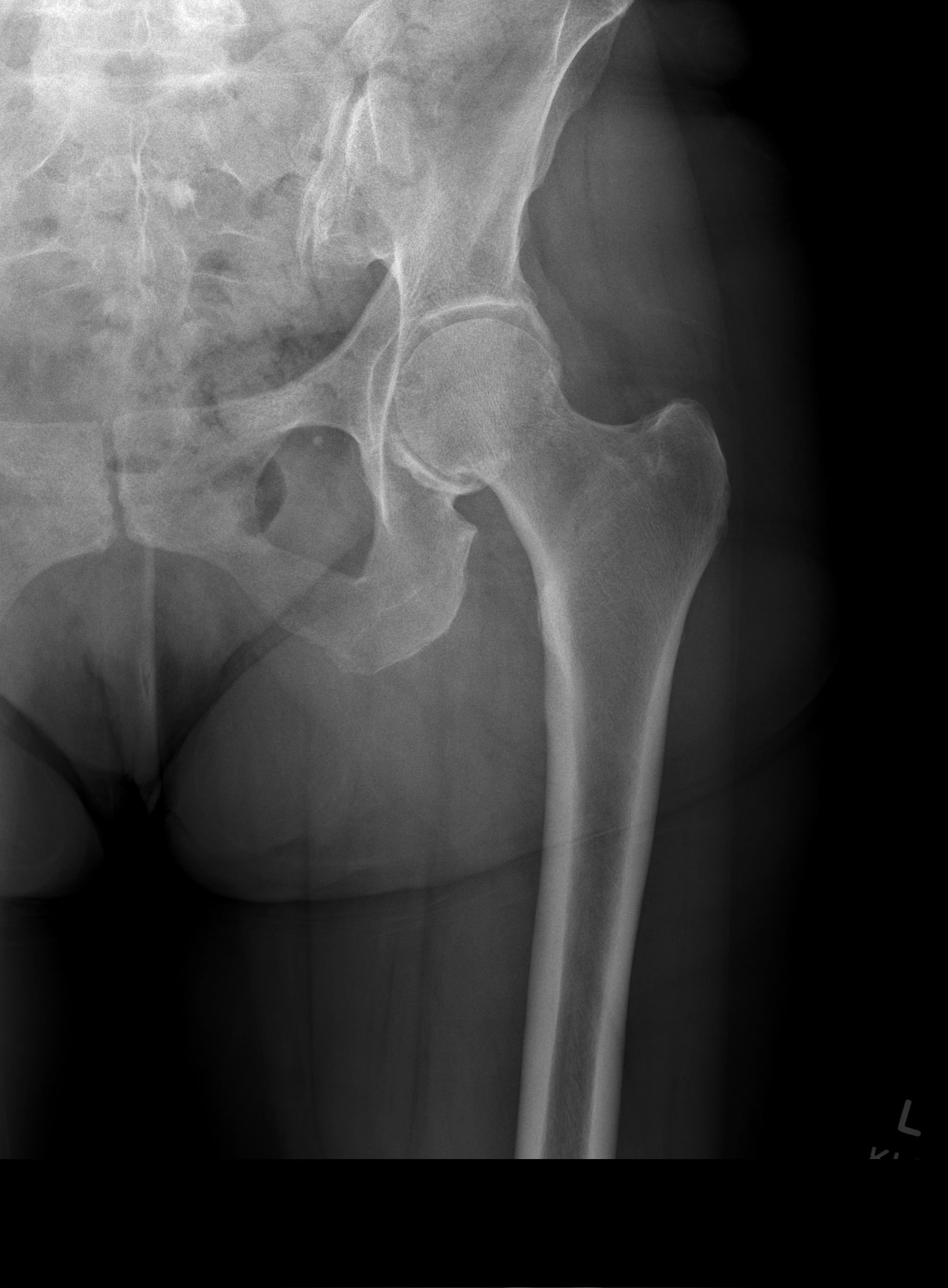

[w hip lat left]
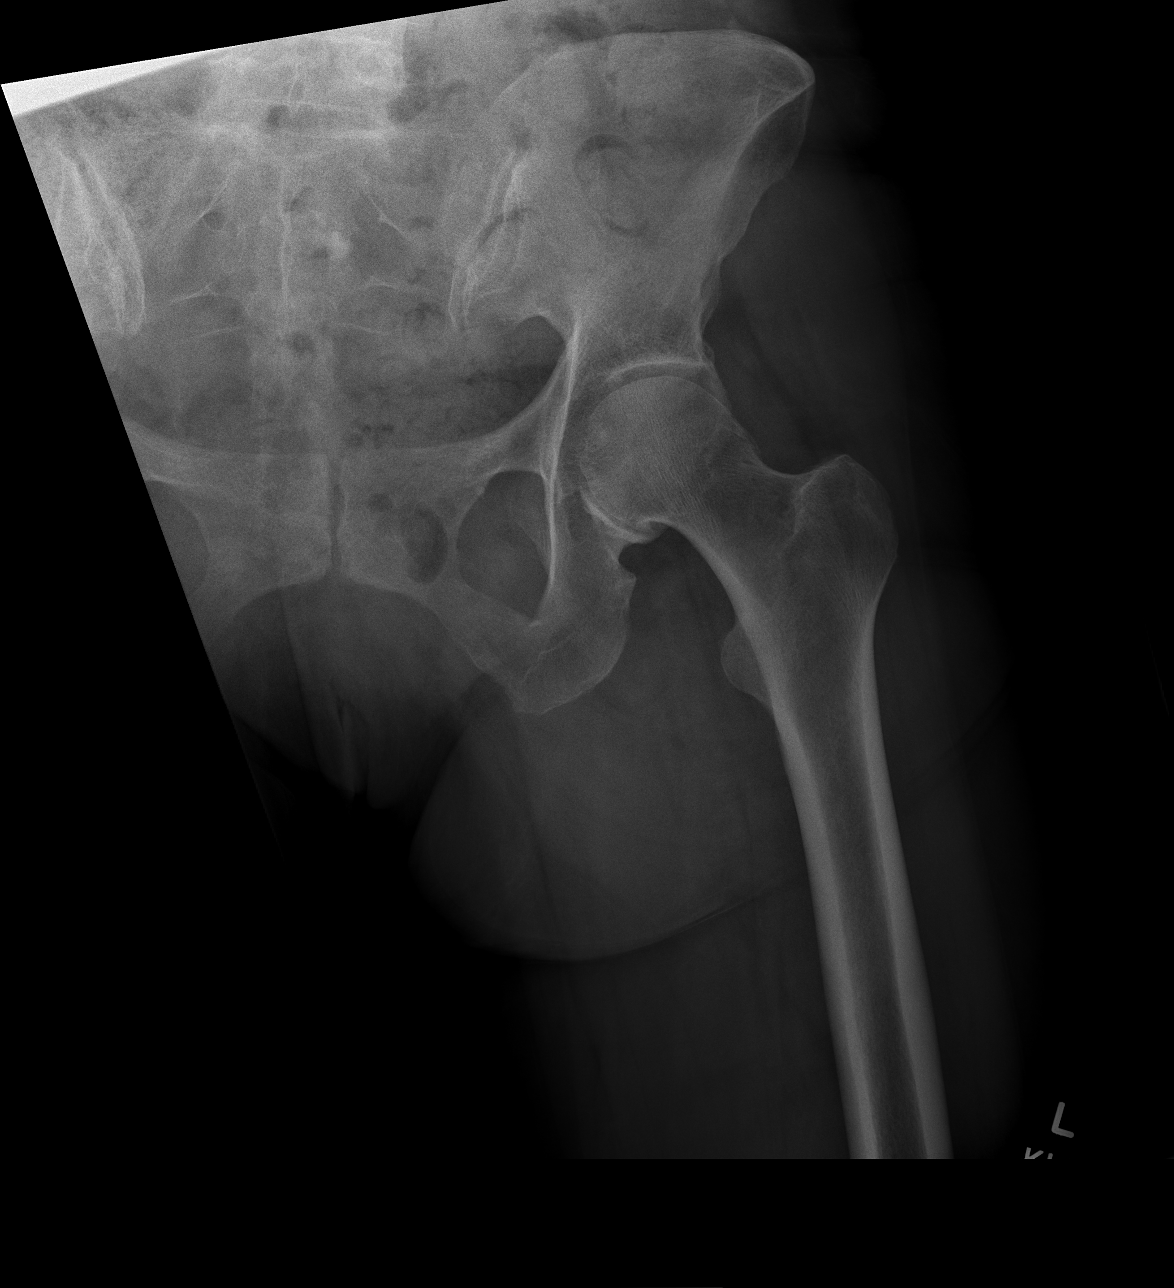

[3 of 3 positions shown; findings below may reference images not displayed]

FINDINGS: No recent fracture or dislocation is seen. Bony spurs seen in the
medial and lateral aspects. There are no opaque foreign bodies.
Degenerative changes are noted in the lower lumbar spine.
IMPRESSION: No recent fracture or dislocation is seen in the left hip.
Degenerative changes are noted in the left hip joint.

## 2021-04-03 ENCOUNTER — Other Ambulatory Visit: Payer: Medicare Other

## 2021-04-03 DIAGNOSIS — M25552 Pain in left hip: Secondary | ICD-10-CM | POA: Diagnosis not present

## 2021-04-03 DIAGNOSIS — M1712 Unilateral primary osteoarthritis, left knee: Secondary | ICD-10-CM | POA: Diagnosis not present

## 2021-04-09 ENCOUNTER — Ambulatory Visit
Admission: RE | Admit: 2021-04-09 | Discharge: 2021-04-09 | Disposition: A | Discharge status: Home / Self Care | Payer: Medicare Other | Source: Ambulatory Visit | Attending: Family Medicine | Admitting: Family Medicine

## 2021-04-09 DIAGNOSIS — M8580 Other specified disorders of bone density and structure, unspecified site: Secondary | ICD-10-CM

## 2021-04-09 DIAGNOSIS — E2839 Other primary ovarian failure: Secondary | ICD-10-CM

## 2021-04-09 DIAGNOSIS — M8589 Other specified disorders of bone density and structure, multiple sites: Secondary | ICD-10-CM | POA: Diagnosis not present

## 2021-04-09 DIAGNOSIS — Z780 Asymptomatic menopausal state: Secondary | ICD-10-CM | POA: Diagnosis not present

## 2021-04-16 DIAGNOSIS — M25562 Pain in left knee: Secondary | ICD-10-CM | POA: Diagnosis not present

## 2021-05-13 DIAGNOSIS — M1712 Unilateral primary osteoarthritis, left knee: Secondary | ICD-10-CM | POA: Diagnosis not present

## 2021-05-13 DIAGNOSIS — Z01818 Encounter for other preprocedural examination: Secondary | ICD-10-CM | POA: Diagnosis not present

## 2021-05-19 DIAGNOSIS — M1712 Unilateral primary osteoarthritis, left knee: Secondary | ICD-10-CM | POA: Diagnosis not present

## 2021-05-22 DIAGNOSIS — M25562 Pain in left knee: Secondary | ICD-10-CM | POA: Diagnosis not present

## 2021-05-22 DIAGNOSIS — M1712 Unilateral primary osteoarthritis, left knee: Secondary | ICD-10-CM | POA: Diagnosis not present

## 2021-05-29 DIAGNOSIS — Z96652 Presence of left artificial knee joint: Secondary | ICD-10-CM | POA: Diagnosis not present

## 2021-05-29 DIAGNOSIS — G8918 Other acute postprocedural pain: Secondary | ICD-10-CM | POA: Diagnosis not present

## 2021-05-29 DIAGNOSIS — M1712 Unilateral primary osteoarthritis, left knee: Secondary | ICD-10-CM | POA: Diagnosis not present

## 2021-06-02 DIAGNOSIS — R262 Difficulty in walking, not elsewhere classified: Secondary | ICD-10-CM | POA: Diagnosis not present

## 2021-06-02 DIAGNOSIS — Z96652 Presence of left artificial knee joint: Secondary | ICD-10-CM | POA: Diagnosis not present

## 2021-06-02 DIAGNOSIS — Z4789 Encounter for other orthopedic aftercare: Secondary | ICD-10-CM | POA: Diagnosis not present

## 2021-06-02 DIAGNOSIS — M25662 Stiffness of left knee, not elsewhere classified: Secondary | ICD-10-CM | POA: Diagnosis not present

## 2021-06-05 DIAGNOSIS — M25662 Stiffness of left knee, not elsewhere classified: Secondary | ICD-10-CM | POA: Diagnosis not present

## 2021-06-05 DIAGNOSIS — Z4789 Encounter for other orthopedic aftercare: Secondary | ICD-10-CM | POA: Diagnosis not present

## 2021-06-05 DIAGNOSIS — Z96652 Presence of left artificial knee joint: Secondary | ICD-10-CM | POA: Diagnosis not present

## 2021-06-05 DIAGNOSIS — R262 Difficulty in walking, not elsewhere classified: Secondary | ICD-10-CM | POA: Diagnosis not present

## 2021-06-09 DIAGNOSIS — Z4789 Encounter for other orthopedic aftercare: Secondary | ICD-10-CM | POA: Diagnosis not present

## 2021-06-09 DIAGNOSIS — M25662 Stiffness of left knee, not elsewhere classified: Secondary | ICD-10-CM | POA: Diagnosis not present

## 2021-06-09 DIAGNOSIS — Z96652 Presence of left artificial knee joint: Secondary | ICD-10-CM | POA: Diagnosis not present

## 2021-06-09 DIAGNOSIS — R262 Difficulty in walking, not elsewhere classified: Secondary | ICD-10-CM | POA: Diagnosis not present

## 2021-06-10 DIAGNOSIS — Z0000 Encounter for general adult medical examination without abnormal findings: Secondary | ICD-10-CM | POA: Diagnosis not present

## 2021-06-10 DIAGNOSIS — Z1159 Encounter for screening for other viral diseases: Secondary | ICD-10-CM | POA: Diagnosis not present

## 2021-06-10 DIAGNOSIS — E039 Hypothyroidism, unspecified: Secondary | ICD-10-CM | POA: Diagnosis not present

## 2021-06-10 DIAGNOSIS — I10 Essential (primary) hypertension: Secondary | ICD-10-CM | POA: Diagnosis not present

## 2021-06-10 DIAGNOSIS — E7800 Pure hypercholesterolemia, unspecified: Secondary | ICD-10-CM | POA: Diagnosis not present

## 2021-06-12 DIAGNOSIS — Z96652 Presence of left artificial knee joint: Secondary | ICD-10-CM | POA: Diagnosis not present

## 2021-06-12 DIAGNOSIS — R262 Difficulty in walking, not elsewhere classified: Secondary | ICD-10-CM | POA: Diagnosis not present

## 2021-06-12 DIAGNOSIS — M25662 Stiffness of left knee, not elsewhere classified: Secondary | ICD-10-CM | POA: Diagnosis not present

## 2021-06-12 DIAGNOSIS — Z4789 Encounter for other orthopedic aftercare: Secondary | ICD-10-CM | POA: Diagnosis not present

## 2021-06-16 DIAGNOSIS — Z96652 Presence of left artificial knee joint: Secondary | ICD-10-CM | POA: Diagnosis not present

## 2021-06-16 DIAGNOSIS — M25662 Stiffness of left knee, not elsewhere classified: Secondary | ICD-10-CM | POA: Diagnosis not present

## 2021-06-16 DIAGNOSIS — R262 Difficulty in walking, not elsewhere classified: Secondary | ICD-10-CM | POA: Diagnosis not present

## 2021-06-16 DIAGNOSIS — Z4789 Encounter for other orthopedic aftercare: Secondary | ICD-10-CM | POA: Diagnosis not present

## 2021-06-19 DIAGNOSIS — M25662 Stiffness of left knee, not elsewhere classified: Secondary | ICD-10-CM | POA: Diagnosis not present

## 2021-06-19 DIAGNOSIS — Z96652 Presence of left artificial knee joint: Secondary | ICD-10-CM | POA: Diagnosis not present

## 2021-06-19 DIAGNOSIS — R262 Difficulty in walking, not elsewhere classified: Secondary | ICD-10-CM | POA: Diagnosis not present

## 2021-06-19 DIAGNOSIS — Z4789 Encounter for other orthopedic aftercare: Secondary | ICD-10-CM | POA: Diagnosis not present

## 2021-06-23 DIAGNOSIS — Z96652 Presence of left artificial knee joint: Secondary | ICD-10-CM | POA: Diagnosis not present

## 2021-06-23 DIAGNOSIS — R262 Difficulty in walking, not elsewhere classified: Secondary | ICD-10-CM | POA: Diagnosis not present

## 2021-06-23 DIAGNOSIS — Z4789 Encounter for other orthopedic aftercare: Secondary | ICD-10-CM | POA: Diagnosis not present

## 2021-06-23 DIAGNOSIS — M25662 Stiffness of left knee, not elsewhere classified: Secondary | ICD-10-CM | POA: Diagnosis not present

## 2021-06-26 DIAGNOSIS — M25662 Stiffness of left knee, not elsewhere classified: Secondary | ICD-10-CM | POA: Diagnosis not present

## 2021-06-26 DIAGNOSIS — Z96652 Presence of left artificial knee joint: Secondary | ICD-10-CM | POA: Diagnosis not present

## 2021-06-26 DIAGNOSIS — R262 Difficulty in walking, not elsewhere classified: Secondary | ICD-10-CM | POA: Diagnosis not present

## 2021-06-26 DIAGNOSIS — Z4789 Encounter for other orthopedic aftercare: Secondary | ICD-10-CM | POA: Diagnosis not present

## 2021-07-01 DIAGNOSIS — Z96652 Presence of left artificial knee joint: Secondary | ICD-10-CM | POA: Diagnosis not present

## 2021-07-01 DIAGNOSIS — Z4789 Encounter for other orthopedic aftercare: Secondary | ICD-10-CM | POA: Diagnosis not present

## 2021-07-01 DIAGNOSIS — M25662 Stiffness of left knee, not elsewhere classified: Secondary | ICD-10-CM | POA: Diagnosis not present

## 2021-07-01 DIAGNOSIS — R262 Difficulty in walking, not elsewhere classified: Secondary | ICD-10-CM | POA: Diagnosis not present

## 2021-07-03 DIAGNOSIS — Z96652 Presence of left artificial knee joint: Secondary | ICD-10-CM | POA: Diagnosis not present

## 2021-07-03 DIAGNOSIS — Z4789 Encounter for other orthopedic aftercare: Secondary | ICD-10-CM | POA: Diagnosis not present

## 2021-07-03 DIAGNOSIS — M25662 Stiffness of left knee, not elsewhere classified: Secondary | ICD-10-CM | POA: Diagnosis not present

## 2021-07-03 DIAGNOSIS — R262 Difficulty in walking, not elsewhere classified: Secondary | ICD-10-CM | POA: Diagnosis not present

## 2021-07-08 DIAGNOSIS — M25662 Stiffness of left knee, not elsewhere classified: Secondary | ICD-10-CM | POA: Diagnosis not present

## 2021-07-08 DIAGNOSIS — Z96652 Presence of left artificial knee joint: Secondary | ICD-10-CM | POA: Diagnosis not present

## 2021-07-08 DIAGNOSIS — Z4789 Encounter for other orthopedic aftercare: Secondary | ICD-10-CM | POA: Diagnosis not present

## 2021-07-08 DIAGNOSIS — R262 Difficulty in walking, not elsewhere classified: Secondary | ICD-10-CM | POA: Diagnosis not present

## 2021-07-09 DIAGNOSIS — M24131 Other articular cartilage disorders, right wrist: Secondary | ICD-10-CM | POA: Diagnosis not present

## 2021-07-09 DIAGNOSIS — M25531 Pain in right wrist: Secondary | ICD-10-CM | POA: Diagnosis not present

## 2021-07-10 DIAGNOSIS — M25662 Stiffness of left knee, not elsewhere classified: Secondary | ICD-10-CM | POA: Diagnosis not present

## 2021-07-10 DIAGNOSIS — Z4789 Encounter for other orthopedic aftercare: Secondary | ICD-10-CM | POA: Diagnosis not present

## 2021-07-10 DIAGNOSIS — R262 Difficulty in walking, not elsewhere classified: Secondary | ICD-10-CM | POA: Diagnosis not present

## 2021-07-10 DIAGNOSIS — Z96652 Presence of left artificial knee joint: Secondary | ICD-10-CM | POA: Diagnosis not present

## 2021-07-15 DIAGNOSIS — M25662 Stiffness of left knee, not elsewhere classified: Secondary | ICD-10-CM | POA: Diagnosis not present

## 2021-07-15 DIAGNOSIS — R262 Difficulty in walking, not elsewhere classified: Secondary | ICD-10-CM | POA: Diagnosis not present

## 2021-07-15 DIAGNOSIS — Z4789 Encounter for other orthopedic aftercare: Secondary | ICD-10-CM | POA: Diagnosis not present

## 2021-07-15 DIAGNOSIS — Z96652 Presence of left artificial knee joint: Secondary | ICD-10-CM | POA: Diagnosis not present

## 2021-07-17 DIAGNOSIS — Z96652 Presence of left artificial knee joint: Secondary | ICD-10-CM | POA: Diagnosis not present

## 2021-07-17 DIAGNOSIS — Z4789 Encounter for other orthopedic aftercare: Secondary | ICD-10-CM | POA: Diagnosis not present

## 2021-07-17 DIAGNOSIS — R262 Difficulty in walking, not elsewhere classified: Secondary | ICD-10-CM | POA: Diagnosis not present

## 2021-07-17 DIAGNOSIS — M25662 Stiffness of left knee, not elsewhere classified: Secondary | ICD-10-CM | POA: Diagnosis not present

## 2021-07-22 DIAGNOSIS — R262 Difficulty in walking, not elsewhere classified: Secondary | ICD-10-CM | POA: Diagnosis not present

## 2021-07-22 DIAGNOSIS — Z4789 Encounter for other orthopedic aftercare: Secondary | ICD-10-CM | POA: Diagnosis not present

## 2021-07-22 DIAGNOSIS — Z96652 Presence of left artificial knee joint: Secondary | ICD-10-CM | POA: Diagnosis not present

## 2021-07-22 DIAGNOSIS — M25662 Stiffness of left knee, not elsewhere classified: Secondary | ICD-10-CM | POA: Diagnosis not present

## 2021-07-24 DIAGNOSIS — Z4789 Encounter for other orthopedic aftercare: Secondary | ICD-10-CM | POA: Diagnosis not present

## 2021-07-24 DIAGNOSIS — Z96652 Presence of left artificial knee joint: Secondary | ICD-10-CM | POA: Diagnosis not present

## 2021-07-24 DIAGNOSIS — M25662 Stiffness of left knee, not elsewhere classified: Secondary | ICD-10-CM | POA: Diagnosis not present

## 2021-07-24 DIAGNOSIS — R262 Difficulty in walking, not elsewhere classified: Secondary | ICD-10-CM | POA: Diagnosis not present

## 2021-08-01 DIAGNOSIS — R262 Difficulty in walking, not elsewhere classified: Secondary | ICD-10-CM | POA: Diagnosis not present

## 2021-08-01 DIAGNOSIS — Z4789 Encounter for other orthopedic aftercare: Secondary | ICD-10-CM | POA: Diagnosis not present

## 2021-08-01 DIAGNOSIS — Z96652 Presence of left artificial knee joint: Secondary | ICD-10-CM | POA: Diagnosis not present

## 2021-08-01 DIAGNOSIS — M25662 Stiffness of left knee, not elsewhere classified: Secondary | ICD-10-CM | POA: Diagnosis not present

## 2021-08-04 DIAGNOSIS — M25662 Stiffness of left knee, not elsewhere classified: Secondary | ICD-10-CM | POA: Diagnosis not present

## 2021-08-04 DIAGNOSIS — Z96652 Presence of left artificial knee joint: Secondary | ICD-10-CM | POA: Diagnosis not present

## 2021-08-04 DIAGNOSIS — R262 Difficulty in walking, not elsewhere classified: Secondary | ICD-10-CM | POA: Diagnosis not present

## 2021-08-04 DIAGNOSIS — Z4789 Encounter for other orthopedic aftercare: Secondary | ICD-10-CM | POA: Diagnosis not present

## 2021-08-07 DIAGNOSIS — M25662 Stiffness of left knee, not elsewhere classified: Secondary | ICD-10-CM | POA: Diagnosis not present

## 2021-08-07 DIAGNOSIS — R262 Difficulty in walking, not elsewhere classified: Secondary | ICD-10-CM | POA: Diagnosis not present

## 2021-08-07 DIAGNOSIS — Z96652 Presence of left artificial knee joint: Secondary | ICD-10-CM | POA: Diagnosis not present

## 2021-08-07 DIAGNOSIS — Z4789 Encounter for other orthopedic aftercare: Secondary | ICD-10-CM | POA: Diagnosis not present

## 2021-08-14 DIAGNOSIS — Z96652 Presence of left artificial knee joint: Secondary | ICD-10-CM | POA: Diagnosis not present

## 2021-08-14 DIAGNOSIS — Z9889 Other specified postprocedural states: Secondary | ICD-10-CM | POA: Diagnosis not present

## 2021-08-14 DIAGNOSIS — M25462 Effusion, left knee: Secondary | ICD-10-CM | POA: Diagnosis not present

## 2021-08-15 DIAGNOSIS — L905 Scar conditions and fibrosis of skin: Secondary | ICD-10-CM | POA: Diagnosis not present

## 2021-08-15 DIAGNOSIS — D485 Neoplasm of uncertain behavior of skin: Secondary | ICD-10-CM | POA: Diagnosis not present

## 2021-08-15 DIAGNOSIS — L249 Irritant contact dermatitis, unspecified cause: Secondary | ICD-10-CM | POA: Diagnosis not present

## 2021-08-22 ENCOUNTER — Other Ambulatory Visit: Payer: Self-pay | Admitting: Orthopaedic Surgery

## 2021-08-22 DIAGNOSIS — M5416 Radiculopathy, lumbar region: Secondary | ICD-10-CM

## 2021-08-31 ENCOUNTER — Other Ambulatory Visit: Payer: Medicare Other

## 2021-09-19 DIAGNOSIS — M25662 Stiffness of left knee, not elsewhere classified: Secondary | ICD-10-CM | POA: Diagnosis not present

## 2021-09-19 DIAGNOSIS — Z96652 Presence of left artificial knee joint: Secondary | ICD-10-CM | POA: Diagnosis not present

## 2021-09-19 DIAGNOSIS — R262 Difficulty in walking, not elsewhere classified: Secondary | ICD-10-CM | POA: Diagnosis not present

## 2021-09-19 DIAGNOSIS — Z4789 Encounter for other orthopedic aftercare: Secondary | ICD-10-CM | POA: Diagnosis not present

## 2021-09-22 DIAGNOSIS — M25662 Stiffness of left knee, not elsewhere classified: Secondary | ICD-10-CM | POA: Diagnosis not present

## 2021-09-22 DIAGNOSIS — R262 Difficulty in walking, not elsewhere classified: Secondary | ICD-10-CM | POA: Diagnosis not present

## 2021-09-22 DIAGNOSIS — Z4789 Encounter for other orthopedic aftercare: Secondary | ICD-10-CM | POA: Diagnosis not present

## 2021-09-22 DIAGNOSIS — Z96652 Presence of left artificial knee joint: Secondary | ICD-10-CM | POA: Diagnosis not present

## 2021-09-24 DIAGNOSIS — R262 Difficulty in walking, not elsewhere classified: Secondary | ICD-10-CM | POA: Diagnosis not present

## 2021-09-24 DIAGNOSIS — M25662 Stiffness of left knee, not elsewhere classified: Secondary | ICD-10-CM | POA: Diagnosis not present

## 2021-09-24 DIAGNOSIS — Z96652 Presence of left artificial knee joint: Secondary | ICD-10-CM | POA: Diagnosis not present

## 2021-09-24 DIAGNOSIS — Z4789 Encounter for other orthopedic aftercare: Secondary | ICD-10-CM | POA: Diagnosis not present

## 2021-09-25 DIAGNOSIS — H52203 Unspecified astigmatism, bilateral: Secondary | ICD-10-CM | POA: Diagnosis not present

## 2021-09-25 DIAGNOSIS — H5203 Hypermetropia, bilateral: Secondary | ICD-10-CM | POA: Diagnosis not present

## 2021-09-25 DIAGNOSIS — D3131 Benign neoplasm of right choroid: Secondary | ICD-10-CM | POA: Diagnosis not present

## 2021-09-25 DIAGNOSIS — H25813 Combined forms of age-related cataract, bilateral: Secondary | ICD-10-CM | POA: Diagnosis not present

## 2021-09-26 DIAGNOSIS — Z4789 Encounter for other orthopedic aftercare: Secondary | ICD-10-CM | POA: Diagnosis not present

## 2021-09-26 DIAGNOSIS — Z96652 Presence of left artificial knee joint: Secondary | ICD-10-CM | POA: Diagnosis not present

## 2021-09-26 DIAGNOSIS — M25662 Stiffness of left knee, not elsewhere classified: Secondary | ICD-10-CM | POA: Diagnosis not present

## 2021-09-26 DIAGNOSIS — R262 Difficulty in walking, not elsewhere classified: Secondary | ICD-10-CM | POA: Diagnosis not present

## 2021-09-29 DIAGNOSIS — R262 Difficulty in walking, not elsewhere classified: Secondary | ICD-10-CM | POA: Diagnosis not present

## 2021-09-29 DIAGNOSIS — M25552 Pain in left hip: Secondary | ICD-10-CM | POA: Diagnosis not present

## 2021-09-29 DIAGNOSIS — M25662 Stiffness of left knee, not elsewhere classified: Secondary | ICD-10-CM | POA: Diagnosis not present

## 2021-09-29 DIAGNOSIS — Z96652 Presence of left artificial knee joint: Secondary | ICD-10-CM | POA: Diagnosis not present

## 2021-09-29 DIAGNOSIS — Z4789 Encounter for other orthopedic aftercare: Secondary | ICD-10-CM | POA: Diagnosis not present

## 2021-10-01 DIAGNOSIS — R262 Difficulty in walking, not elsewhere classified: Secondary | ICD-10-CM | POA: Diagnosis not present

## 2021-10-01 DIAGNOSIS — M25662 Stiffness of left knee, not elsewhere classified: Secondary | ICD-10-CM | POA: Diagnosis not present

## 2021-10-01 DIAGNOSIS — Z4789 Encounter for other orthopedic aftercare: Secondary | ICD-10-CM | POA: Diagnosis not present

## 2021-10-01 DIAGNOSIS — Z96652 Presence of left artificial knee joint: Secondary | ICD-10-CM | POA: Diagnosis not present

## 2021-10-02 DIAGNOSIS — H52223 Regular astigmatism, bilateral: Secondary | ICD-10-CM | POA: Diagnosis not present

## 2021-10-03 DIAGNOSIS — M25662 Stiffness of left knee, not elsewhere classified: Secondary | ICD-10-CM | POA: Diagnosis not present

## 2021-10-03 DIAGNOSIS — Z96652 Presence of left artificial knee joint: Secondary | ICD-10-CM | POA: Diagnosis not present

## 2021-10-03 DIAGNOSIS — Z4789 Encounter for other orthopedic aftercare: Secondary | ICD-10-CM | POA: Diagnosis not present

## 2021-10-03 DIAGNOSIS — R262 Difficulty in walking, not elsewhere classified: Secondary | ICD-10-CM | POA: Diagnosis not present

## 2021-10-07 DIAGNOSIS — R262 Difficulty in walking, not elsewhere classified: Secondary | ICD-10-CM | POA: Diagnosis not present

## 2021-10-07 DIAGNOSIS — Z4789 Encounter for other orthopedic aftercare: Secondary | ICD-10-CM | POA: Diagnosis not present

## 2021-10-07 DIAGNOSIS — M25662 Stiffness of left knee, not elsewhere classified: Secondary | ICD-10-CM | POA: Diagnosis not present

## 2021-10-07 DIAGNOSIS — Z96652 Presence of left artificial knee joint: Secondary | ICD-10-CM | POA: Diagnosis not present

## 2021-10-08 DIAGNOSIS — Z4789 Encounter for other orthopedic aftercare: Secondary | ICD-10-CM | POA: Diagnosis not present

## 2021-10-08 DIAGNOSIS — Z96652 Presence of left artificial knee joint: Secondary | ICD-10-CM | POA: Diagnosis not present

## 2021-10-08 DIAGNOSIS — R262 Difficulty in walking, not elsewhere classified: Secondary | ICD-10-CM | POA: Diagnosis not present

## 2021-10-08 DIAGNOSIS — M25662 Stiffness of left knee, not elsewhere classified: Secondary | ICD-10-CM | POA: Diagnosis not present

## 2021-10-10 DIAGNOSIS — Z4789 Encounter for other orthopedic aftercare: Secondary | ICD-10-CM | POA: Diagnosis not present

## 2021-10-10 DIAGNOSIS — M25662 Stiffness of left knee, not elsewhere classified: Secondary | ICD-10-CM | POA: Diagnosis not present

## 2021-10-10 DIAGNOSIS — R262 Difficulty in walking, not elsewhere classified: Secondary | ICD-10-CM | POA: Diagnosis not present

## 2021-10-10 DIAGNOSIS — Z96652 Presence of left artificial knee joint: Secondary | ICD-10-CM | POA: Diagnosis not present

## 2021-10-13 DIAGNOSIS — R262 Difficulty in walking, not elsewhere classified: Secondary | ICD-10-CM | POA: Diagnosis not present

## 2021-10-13 DIAGNOSIS — Z4789 Encounter for other orthopedic aftercare: Secondary | ICD-10-CM | POA: Diagnosis not present

## 2021-10-13 DIAGNOSIS — Z96652 Presence of left artificial knee joint: Secondary | ICD-10-CM | POA: Diagnosis not present

## 2021-10-13 DIAGNOSIS — M25662 Stiffness of left knee, not elsewhere classified: Secondary | ICD-10-CM | POA: Diagnosis not present

## 2021-10-14 DIAGNOSIS — M25552 Pain in left hip: Secondary | ICD-10-CM | POA: Diagnosis not present

## 2021-10-15 DIAGNOSIS — M25662 Stiffness of left knee, not elsewhere classified: Secondary | ICD-10-CM | POA: Diagnosis not present

## 2021-10-15 DIAGNOSIS — Z96652 Presence of left artificial knee joint: Secondary | ICD-10-CM | POA: Diagnosis not present

## 2021-10-15 DIAGNOSIS — Z4789 Encounter for other orthopedic aftercare: Secondary | ICD-10-CM | POA: Diagnosis not present

## 2021-10-15 DIAGNOSIS — R262 Difficulty in walking, not elsewhere classified: Secondary | ICD-10-CM | POA: Diagnosis not present

## 2021-10-16 ENCOUNTER — Other Ambulatory Visit: Payer: Self-pay | Admitting: Sports Medicine

## 2021-10-16 DIAGNOSIS — M25552 Pain in left hip: Secondary | ICD-10-CM

## 2021-10-17 DIAGNOSIS — R262 Difficulty in walking, not elsewhere classified: Secondary | ICD-10-CM | POA: Diagnosis not present

## 2021-10-17 DIAGNOSIS — M25662 Stiffness of left knee, not elsewhere classified: Secondary | ICD-10-CM | POA: Diagnosis not present

## 2021-10-17 DIAGNOSIS — Z4789 Encounter for other orthopedic aftercare: Secondary | ICD-10-CM | POA: Diagnosis not present

## 2021-10-17 DIAGNOSIS — Z96652 Presence of left artificial knee joint: Secondary | ICD-10-CM | POA: Diagnosis not present

## 2021-10-20 DIAGNOSIS — Z4789 Encounter for other orthopedic aftercare: Secondary | ICD-10-CM | POA: Diagnosis not present

## 2021-10-20 DIAGNOSIS — Z96652 Presence of left artificial knee joint: Secondary | ICD-10-CM | POA: Diagnosis not present

## 2021-10-20 DIAGNOSIS — R262 Difficulty in walking, not elsewhere classified: Secondary | ICD-10-CM | POA: Diagnosis not present

## 2021-10-20 DIAGNOSIS — M25662 Stiffness of left knee, not elsewhere classified: Secondary | ICD-10-CM | POA: Diagnosis not present

## 2021-11-06 ENCOUNTER — Ambulatory Visit
Admission: RE | Admit: 2021-11-06 | Discharge: 2021-11-06 | Disposition: A | Discharge status: Home / Self Care | Payer: Medicare Other | Source: Ambulatory Visit | Attending: Sports Medicine | Admitting: Sports Medicine

## 2021-11-06 DIAGNOSIS — M25552 Pain in left hip: Secondary | ICD-10-CM

## 2021-11-07 DIAGNOSIS — Z96652 Presence of left artificial knee joint: Secondary | ICD-10-CM | POA: Diagnosis not present

## 2021-11-07 DIAGNOSIS — Z4789 Encounter for other orthopedic aftercare: Secondary | ICD-10-CM | POA: Diagnosis not present

## 2021-11-07 DIAGNOSIS — M25662 Stiffness of left knee, not elsewhere classified: Secondary | ICD-10-CM | POA: Diagnosis not present

## 2021-11-07 DIAGNOSIS — R262 Difficulty in walking, not elsewhere classified: Secondary | ICD-10-CM | POA: Diagnosis not present

## 2021-11-11 DIAGNOSIS — M25662 Stiffness of left knee, not elsewhere classified: Secondary | ICD-10-CM | POA: Diagnosis not present

## 2021-11-11 DIAGNOSIS — R262 Difficulty in walking, not elsewhere classified: Secondary | ICD-10-CM | POA: Diagnosis not present

## 2021-11-11 DIAGNOSIS — Z4789 Encounter for other orthopedic aftercare: Secondary | ICD-10-CM | POA: Diagnosis not present

## 2021-11-11 DIAGNOSIS — Z96652 Presence of left artificial knee joint: Secondary | ICD-10-CM | POA: Diagnosis not present

## 2021-11-13 DIAGNOSIS — M25662 Stiffness of left knee, not elsewhere classified: Secondary | ICD-10-CM | POA: Diagnosis not present

## 2021-11-13 DIAGNOSIS — M1612 Unilateral primary osteoarthritis, left hip: Secondary | ICD-10-CM | POA: Diagnosis not present

## 2021-11-13 DIAGNOSIS — Z96652 Presence of left artificial knee joint: Secondary | ICD-10-CM | POA: Diagnosis not present

## 2021-11-13 DIAGNOSIS — R262 Difficulty in walking, not elsewhere classified: Secondary | ICD-10-CM | POA: Diagnosis not present

## 2021-11-13 DIAGNOSIS — Z4789 Encounter for other orthopedic aftercare: Secondary | ICD-10-CM | POA: Diagnosis not present

## 2021-11-18 DIAGNOSIS — R262 Difficulty in walking, not elsewhere classified: Secondary | ICD-10-CM | POA: Diagnosis not present

## 2021-11-18 DIAGNOSIS — Z4789 Encounter for other orthopedic aftercare: Secondary | ICD-10-CM | POA: Diagnosis not present

## 2021-11-18 DIAGNOSIS — M25662 Stiffness of left knee, not elsewhere classified: Secondary | ICD-10-CM | POA: Diagnosis not present

## 2021-11-18 DIAGNOSIS — Z96652 Presence of left artificial knee joint: Secondary | ICD-10-CM | POA: Diagnosis not present

## 2021-11-25 DIAGNOSIS — R262 Difficulty in walking, not elsewhere classified: Secondary | ICD-10-CM | POA: Diagnosis not present

## 2021-11-25 DIAGNOSIS — M25662 Stiffness of left knee, not elsewhere classified: Secondary | ICD-10-CM | POA: Diagnosis not present

## 2021-11-25 DIAGNOSIS — Z4789 Encounter for other orthopedic aftercare: Secondary | ICD-10-CM | POA: Diagnosis not present

## 2021-11-25 DIAGNOSIS — Z96652 Presence of left artificial knee joint: Secondary | ICD-10-CM | POA: Diagnosis not present

## 2021-11-27 DIAGNOSIS — Z96652 Presence of left artificial knee joint: Secondary | ICD-10-CM | POA: Diagnosis not present

## 2021-11-27 DIAGNOSIS — M25662 Stiffness of left knee, not elsewhere classified: Secondary | ICD-10-CM | POA: Diagnosis not present

## 2021-11-27 DIAGNOSIS — R262 Difficulty in walking, not elsewhere classified: Secondary | ICD-10-CM | POA: Diagnosis not present

## 2021-11-27 DIAGNOSIS — Z4789 Encounter for other orthopedic aftercare: Secondary | ICD-10-CM | POA: Diagnosis not present

## 2021-12-02 DIAGNOSIS — M25662 Stiffness of left knee, not elsewhere classified: Secondary | ICD-10-CM | POA: Diagnosis not present

## 2021-12-02 DIAGNOSIS — Z4789 Encounter for other orthopedic aftercare: Secondary | ICD-10-CM | POA: Diagnosis not present

## 2021-12-02 DIAGNOSIS — R262 Difficulty in walking, not elsewhere classified: Secondary | ICD-10-CM | POA: Diagnosis not present

## 2021-12-02 DIAGNOSIS — Z96652 Presence of left artificial knee joint: Secondary | ICD-10-CM | POA: Diagnosis not present

## 2021-12-29 DIAGNOSIS — Z96652 Presence of left artificial knee joint: Secondary | ICD-10-CM | POA: Diagnosis not present

## 2021-12-29 DIAGNOSIS — M25662 Stiffness of left knee, not elsewhere classified: Secondary | ICD-10-CM | POA: Diagnosis not present

## 2021-12-29 DIAGNOSIS — E039 Hypothyroidism, unspecified: Secondary | ICD-10-CM | POA: Diagnosis not present

## 2021-12-29 DIAGNOSIS — E7800 Pure hypercholesterolemia, unspecified: Secondary | ICD-10-CM | POA: Diagnosis not present

## 2021-12-29 DIAGNOSIS — I10 Essential (primary) hypertension: Secondary | ICD-10-CM | POA: Diagnosis not present

## 2021-12-29 DIAGNOSIS — R262 Difficulty in walking, not elsewhere classified: Secondary | ICD-10-CM | POA: Diagnosis not present

## 2021-12-29 DIAGNOSIS — F329 Major depressive disorder, single episode, unspecified: Secondary | ICD-10-CM | POA: Diagnosis not present

## 2021-12-29 DIAGNOSIS — Z4789 Encounter for other orthopedic aftercare: Secondary | ICD-10-CM | POA: Diagnosis not present

## 2022-01-06 DIAGNOSIS — G8929 Other chronic pain: Secondary | ICD-10-CM | POA: Diagnosis not present

## 2022-01-06 DIAGNOSIS — M5136 Other intervertebral disc degeneration, lumbar region: Secondary | ICD-10-CM | POA: Diagnosis not present

## 2022-01-06 DIAGNOSIS — M25562 Pain in left knee: Secondary | ICD-10-CM | POA: Diagnosis not present

## 2022-01-06 DIAGNOSIS — M25852 Other specified joint disorders, left hip: Secondary | ICD-10-CM | POA: Diagnosis not present

## 2022-01-06 DIAGNOSIS — M4316 Spondylolisthesis, lumbar region: Secondary | ICD-10-CM | POA: Diagnosis not present

## 2022-01-06 DIAGNOSIS — M5442 Lumbago with sciatica, left side: Secondary | ICD-10-CM | POA: Diagnosis not present

## 2022-02-03 DIAGNOSIS — Z1212 Encounter for screening for malignant neoplasm of rectum: Secondary | ICD-10-CM | POA: Diagnosis not present

## 2022-02-03 DIAGNOSIS — Z1211 Encounter for screening for malignant neoplasm of colon: Secondary | ICD-10-CM | POA: Diagnosis not present

## 2022-02-04 DIAGNOSIS — D3131 Benign neoplasm of right choroid: Secondary | ICD-10-CM | POA: Diagnosis not present

## 2022-02-04 DIAGNOSIS — H25813 Combined forms of age-related cataract, bilateral: Secondary | ICD-10-CM | POA: Diagnosis not present

## 2022-02-17 DIAGNOSIS — Z96652 Presence of left artificial knee joint: Secondary | ICD-10-CM | POA: Diagnosis not present

## 2022-02-17 DIAGNOSIS — M25562 Pain in left knee: Secondary | ICD-10-CM | POA: Diagnosis not present

## 2022-02-25 DIAGNOSIS — M79644 Pain in right finger(s): Secondary | ICD-10-CM | POA: Diagnosis not present

## 2022-02-25 DIAGNOSIS — M13841 Other specified arthritis, right hand: Secondary | ICD-10-CM | POA: Diagnosis not present

## 2022-02-25 DIAGNOSIS — M24131 Other articular cartilage disorders, right wrist: Secondary | ICD-10-CM | POA: Diagnosis not present

## 2022-02-28 DIAGNOSIS — M25531 Pain in right wrist: Secondary | ICD-10-CM | POA: Diagnosis not present

## 2022-03-20 DIAGNOSIS — M24131 Other articular cartilage disorders, right wrist: Secondary | ICD-10-CM | POA: Diagnosis not present

## 2022-03-20 DIAGNOSIS — M1811 Unilateral primary osteoarthritis of first carpometacarpal joint, right hand: Secondary | ICD-10-CM | POA: Diagnosis not present

## 2022-03-20 DIAGNOSIS — M19031 Primary osteoarthritis, right wrist: Secondary | ICD-10-CM | POA: Diagnosis not present

## 2022-03-20 DIAGNOSIS — M11231 Other chondrocalcinosis, right wrist: Secondary | ICD-10-CM | POA: Diagnosis not present

## 2022-03-20 DIAGNOSIS — M65831 Other synovitis and tenosynovitis, right forearm: Secondary | ICD-10-CM | POA: Diagnosis not present

## 2022-03-20 DIAGNOSIS — M13131 Monoarthritis, not elsewhere classified, right wrist: Secondary | ICD-10-CM | POA: Diagnosis not present

## 2022-03-30 DIAGNOSIS — M1712 Unilateral primary osteoarthritis, left knee: Secondary | ICD-10-CM | POA: Diagnosis not present

## 2022-03-30 DIAGNOSIS — M25529 Pain in unspecified elbow: Secondary | ICD-10-CM | POA: Diagnosis not present

## 2022-03-30 DIAGNOSIS — Z96652 Presence of left artificial knee joint: Secondary | ICD-10-CM | POA: Diagnosis not present

## 2022-03-31 DIAGNOSIS — H25813 Combined forms of age-related cataract, bilateral: Secondary | ICD-10-CM | POA: Diagnosis not present

## 2022-04-02 DIAGNOSIS — Z4789 Encounter for other orthopedic aftercare: Secondary | ICD-10-CM | POA: Diagnosis not present

## 2022-04-09 DIAGNOSIS — H25811 Combined forms of age-related cataract, right eye: Secondary | ICD-10-CM | POA: Diagnosis not present

## 2022-04-10 DIAGNOSIS — D3131 Benign neoplasm of right choroid: Secondary | ICD-10-CM | POA: Diagnosis not present

## 2022-04-10 DIAGNOSIS — H2512 Age-related nuclear cataract, left eye: Secondary | ICD-10-CM | POA: Diagnosis not present

## 2022-04-10 DIAGNOSIS — Z9841 Cataract extraction status, right eye: Secondary | ICD-10-CM | POA: Diagnosis not present

## 2022-04-16 DIAGNOSIS — Z4789 Encounter for other orthopedic aftercare: Secondary | ICD-10-CM | POA: Diagnosis not present

## 2022-04-16 DIAGNOSIS — M25641 Stiffness of right hand, not elsewhere classified: Secondary | ICD-10-CM | POA: Diagnosis not present

## 2022-04-22 ENCOUNTER — Other Ambulatory Visit (HOSPITAL_COMMUNITY): Payer: Self-pay | Admitting: Orthopaedic Surgery

## 2022-04-22 DIAGNOSIS — M79641 Pain in right hand: Secondary | ICD-10-CM | POA: Diagnosis not present

## 2022-04-22 DIAGNOSIS — M25562 Pain in left knee: Secondary | ICD-10-CM

## 2022-04-28 DIAGNOSIS — M25641 Stiffness of right hand, not elsewhere classified: Secondary | ICD-10-CM | POA: Diagnosis not present

## 2022-04-30 DIAGNOSIS — H2512 Age-related nuclear cataract, left eye: Secondary | ICD-10-CM | POA: Diagnosis not present

## 2022-04-30 DIAGNOSIS — H52202 Unspecified astigmatism, left eye: Secondary | ICD-10-CM | POA: Diagnosis not present

## 2022-04-30 DIAGNOSIS — H25812 Combined forms of age-related cataract, left eye: Secondary | ICD-10-CM | POA: Diagnosis not present

## 2022-05-01 DIAGNOSIS — Z9842 Cataract extraction status, left eye: Secondary | ICD-10-CM | POA: Diagnosis not present

## 2022-05-01 DIAGNOSIS — Z9841 Cataract extraction status, right eye: Secondary | ICD-10-CM | POA: Diagnosis not present

## 2022-05-01 DIAGNOSIS — H26491 Other secondary cataract, right eye: Secondary | ICD-10-CM | POA: Diagnosis not present

## 2022-05-01 DIAGNOSIS — D3131 Benign neoplasm of right choroid: Secondary | ICD-10-CM | POA: Diagnosis not present

## 2022-05-07 ENCOUNTER — Encounter (HOSPITAL_COMMUNITY): Payer: Medicare Other

## 2022-05-12 ENCOUNTER — Encounter (HOSPITAL_COMMUNITY)
Admission: RE | Admit: 2022-05-12 | Discharge: 2022-05-12 | Disposition: A | Discharge status: Home / Self Care | Payer: Medicare Other | Source: Ambulatory Visit | Attending: Orthopaedic Surgery | Admitting: Orthopaedic Surgery

## 2022-05-12 ENCOUNTER — Encounter (HOSPITAL_COMMUNITY): Payer: Self-pay

## 2022-05-12 ENCOUNTER — Ambulatory Visit (HOSPITAL_COMMUNITY)
Admission: RE | Admit: 2022-05-12 | Discharge: 2022-05-12 | Disposition: A | Discharge status: Home / Self Care | Payer: Medicare Other | Source: Ambulatory Visit | Attending: Orthopaedic Surgery | Admitting: Orthopaedic Surgery

## 2022-05-12 DIAGNOSIS — Z96652 Presence of left artificial knee joint: Secondary | ICD-10-CM | POA: Diagnosis not present

## 2022-05-12 DIAGNOSIS — M25562 Pain in left knee: Secondary | ICD-10-CM

## 2022-05-12 HISTORY — DX: Essential (primary) hypertension: I10

## 2022-05-12 MED ORDER — TECHNETIUM TC 99M MEDRONATE IV KIT
20.0000 | PACK | Freq: Once | INTRAVENOUS | Status: AC | PRN
  Administered 2022-05-12: 18 via INTRAVENOUS

## 2022-05-13 DIAGNOSIS — D3131 Benign neoplasm of right choroid: Secondary | ICD-10-CM | POA: Diagnosis not present

## 2022-05-13 DIAGNOSIS — Z9841 Cataract extraction status, right eye: Secondary | ICD-10-CM | POA: Diagnosis not present

## 2022-05-13 DIAGNOSIS — Z9842 Cataract extraction status, left eye: Secondary | ICD-10-CM | POA: Diagnosis not present

## 2022-05-13 DIAGNOSIS — M79641 Pain in right hand: Secondary | ICD-10-CM | POA: Diagnosis not present

## 2022-05-13 DIAGNOSIS — H26493 Other secondary cataract, bilateral: Secondary | ICD-10-CM | POA: Diagnosis not present

## 2022-05-14 ENCOUNTER — Other Ambulatory Visit (HOSPITAL_COMMUNITY): Payer: Medicare Other

## 2022-05-14 DIAGNOSIS — Z4789 Encounter for other orthopedic aftercare: Secondary | ICD-10-CM | POA: Diagnosis not present

## 2022-05-14 DIAGNOSIS — M1811 Unilateral primary osteoarthritis of first carpometacarpal joint, right hand: Secondary | ICD-10-CM | POA: Diagnosis not present

## 2022-05-15 DIAGNOSIS — M1712 Unilateral primary osteoarthritis, left knee: Secondary | ICD-10-CM | POA: Diagnosis not present

## 2022-05-21 ENCOUNTER — Telehealth: Payer: Self-pay

## 2022-05-21 NOTE — Telephone Encounter (Signed)
-----   Message from Hilarie Fredrickson, MD sent at 05/18/2022  2:27 PM EDT ----- Regarding: Needs appointment Allison Harris,  I was contacted the short while ago by Dr. Kennith Center (radiologist) who would like to get this patient in for an appointment ASAP.  The reason is positive Cologuard testing.  She was apparently referred to another GI practice, but they could not see her until July.  See if you can get her an appointment with any provider ASAP.  Contact patient when you have secured an appointment.  Let me know who she will be seeing and when.  Thanks so much,  JP

## 2022-05-21 NOTE — Telephone Encounter (Signed)
Patient called into the office on 05/19/22 and scheduled direct colon and PV. Patient has a + cologuard and will need an office visit for evaluation. Soonest appt is on 06/04/22 at 11:10 am with Dr. Tomasa Rand. I have cancelled PV and colonoscopy appt. Pt has been scheduled for OV instead. Lm on vm for patient to return call to discuss.

## 2022-05-22 NOTE — Telephone Encounter (Signed)
Pt returned call. I have made her aware that her direct colonoscopy has been cancelled. She is aware that she needs an office visit due to + cologuard. I provided patient with her new appt information. Patient verbalized understanding and had no concerns at the end of the call.

## 2022-05-22 NOTE — Telephone Encounter (Signed)
Lm on vm for patient to return call to discuss appointment changes.

## 2022-05-28 DIAGNOSIS — M25641 Stiffness of right hand, not elsewhere classified: Secondary | ICD-10-CM | POA: Diagnosis not present

## 2022-06-04 ENCOUNTER — Ambulatory Visit: Payer: Medicare Other | Admitting: Gastroenterology

## 2022-06-04 ENCOUNTER — Encounter: Payer: Self-pay | Admitting: Gastroenterology

## 2022-06-04 VITALS — BP 100/70 | HR 68 | Ht 67.0 in | Wt 154.1 lb

## 2022-06-04 DIAGNOSIS — R195 Other fecal abnormalities: Secondary | ICD-10-CM | POA: Diagnosis not present

## 2022-06-04 MED ORDER — SUTAB 1479-225-188 MG PO TABS: 24.0000 | ORAL_TABLET | Freq: Once | ORAL | 0 refills | Status: AC

## 2022-06-04 NOTE — Patient Instructions (Signed)
_______________________________________________________  If your blood pressure at your visit was 140/90 or greater, please contact your primary care physician to follow up on this.  _______________________________________________________  If you are age 76 or older, your body mass index should be between 23-30. Your Body mass index is 24.14 kg/m. If this is out of the aforementioned range listed, please consider follow up with your Primary Care Provider.  If you are age 83 or younger, your body mass index should be between 19-25. Your Body mass index is 24.14 kg/m. If this is out of the aformentioned range listed, please consider follow up with your Primary Care Provider.   You have been scheduled for a colonoscopy. Please follow written instructions given to you at your visit today.  Please pick up your prep supplies at the pharmacy within the next 1-3 days. If you use inhalers (even only as needed), please bring them with you on the day of your procedure.  The [facility] providers would like to encourage you to use [facility] to communicate with providers for non-urgent requests or questions.  Due to long hold times on the telephone, sending your provider a message by [facility] may be a faster and more efficient way to get a response.  Please allow 48 business hours for a response.  Please remember that this is for non-urgent requests.   It was a pleasure to see you today!  Thank you for trusting me with your gastrointestinal care!    Scott E.Tomasa Rand, MD

## 2022-06-04 NOTE — Progress Notes (Signed)
HPI : Allison Harris is a very pleasant 77 year old female with a history of hypertension, hyperlipidemia and arthritis who is referred to [place] by Dr. Merri Brunette for positive Cologuard test.  She denies any overt GI bleeding.  She denies seeing any blood on the toilet paper and no dark or tarry stools.  She has regular bowel movements and denies problems with constipation or diarrhea.  No problems with abdominal pain.  Her weight has been stable. She reports having a normal colonoscopy by Dr. Bertram Denver many years ago which she says was normal.  She does not recall exactly when it was.  Her mother was diagnosed with colon cancer in her 27s, otherwise no family history of colon cancer.  Patient is usually very active, but has been having problems with her knee.  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  She is having surgery next week to remove the scar tissue. She has no known cardiopulmonary comorbidities and denies any concerning symptoms such as chest pain/pressure, shortness of breath, lightheadedness/dizziness or syncope.   Past Medical History:  Diagnosis Date   Arthritis    Hypertension    Hypothyroidism      Past Surgical History:  Procedure Laterality Date   CATARACT EXTRACTION, BILATERAL     HAND SURGERY Right    TOTAL KNEE ARTHROPLASTY Left    Family History  Problem Relation Age of Onset   Colon cancer Mother    Heart attack Father    Rheum arthritis Sister    Hypertension Brother    Hypertension Brother    Breast cancer Neg Hx    Social History   Tobacco Use   Smoking status: Never   Smokeless tobacco: Never  Vaping Use   Vaping Use: Never used  Substance Use Topics   Alcohol use: Yes   Current Outpatient Medications  Medication Sig Dispense Refill   lisinopril-hydrochlorothiazide (ZESTORETIC) 10-12.5 MG tablet Take 1 tablet by mouth daily.     Multiple Vitamin  (MULTIVITAMIN) tablet Take 1 tablet by mouth daily.     sertraline (ZOLOFT) 50 MG tablet Take 1 tablet by mouth daily.     SYNTHROID 25 MCG tablet Take 25 mcg by mouth every morning.     Thiamine HCl (VITAMIN B-1 PO) Take 1 tablet by mouth daily.     VITAMIN D PO Take 1 capsule by mouth daily.     No current facility-administered medications for this visit.   No Known Allergies   Review of Systems: All systems reviewed and negative except where noted in HPI.    NM Bone Scan 3 Phase  Result Date: 05/13/2022 CLINICAL DATA:  Status post left knee arthroplasty on 04/23. Five months ago patient had a twisting injury to the left knee resulting in pain which has persisted. EXAM: NUCLEAR MEDICINE 3-PHASE BONE SCAN TECHNIQUE: Radionuclide angiographic images, immediate static blood pool images, and 3-hour delayed static images were obtained of the knees after intravenous injection of radiopharmaceutical. RADIOPHARMACEUTICALS:  18.0 mCi Tc-75m MDP IV COMPARISON:  None Available. FINDINGS: Vascular phase: There is mild asymmetric increased blood flow localizing to the left knee. Blood pool phase: On the blood pool phase portion of the exam there is increased radiotracer uptake localizing to the soft tissues about the left knee. Delayed phase: Increased radiotracer uptake is identified localizing to the bone overlying the femoral and tibial components of the left knee  arthroplasty device. Degenerative pattern of uptake the dome in the right knee. IMPRESSION: Increased radiotracer uptake localizing to the left knee is identified on all 3 phases, which in the appropriate clinical setting may reflect underlying arthroplasty device loosening or infection. However, in the early postoperative time frame specificity of these findings may be diminished. Degenerative type uptake identified within the right knee. Electronically Signed   By: Signa Kell M.D.   On: 05/13/2022 15:42    Physical Exam: BP 100/70 (BP  Location: Left Arm, Patient Position: Sitting, Cuff Size: Normal)   Pulse 68   Ht 5\' 7"  (1.702 m)   Wt 154 lb 2 oz (69.9 kg)   BMI 24.14 kg/m  Constitutional: Pleasant,well-developed, Caucasian female in no acute distress. HEENT: Normocephalic and atraumatic. Conjunctivae are normal. No scleral icterus. Cardiovascular: Normal rate, regular rhythm.  Pulmonary/chest: Effort normal and breath sounds normal. No wheezing, rales or rhonchi. Abdominal: Soft, nondistended, nontender. Bowel sounds active throughout. There are no masses palpable. No hepatomegaly. Extremities: no edema Neurological: Alert and oriented to person place and time. Skin: Skin is warm and dry. No rashes noted. Psychiatric: Normal mood and affect. Behavior is normal.  CBC No results found for: "WBC", "RBC", "HGB", "HCT", "PLT", "MCV", "MCH", "MCHC", "RDW", "LYMPHSABS", "MONOABS", "EOSABS", "BASOSABS"  CMP  No results found for: "NA", "K", "CL", "CO2", "GLUCOSE", "BUN", "CREATININE", "CALCIUM", "PROT", "ALBUMIN", "AST", "ALT", "ALKPHOS", "BILITOT", "GFRNONAA", "GFRAA"   ASSESSMENT AND PLAN:  77 year old female with history of hypertension, hyperlipidemia and arthritis with recent positive Cologuard.  She has no symptoms of overt bleeding, or any other chronic GI symptoms.  She reportedly had a normal colonoscopy many years ago (unable to find report).  She has a family history of colon cancer (mother, 24s).  Will schedule patient for routine colonoscopy to evaluate positive Cologuard. Patient request Sutab for her bowel prep.  Positive Cologuard -  Colonoscopy with Sutab bowel prep  The details, risks (including bleeding, perforation, infection, missed lesions, medication reactions and possible hospitalization or surgery if complications occur), benefits, and alternatives to colonoscopy with possible biopsy and possible polypectomy were discussed with the patient and she consents to proceed.   Glennys Schorsch E. Tomasa Rand,  MD [facility]  CC:  Merri Brunette, MD

## 2022-06-10 DIAGNOSIS — Z9841 Cataract extraction status, right eye: Secondary | ICD-10-CM | POA: Diagnosis not present

## 2022-06-10 DIAGNOSIS — Z9842 Cataract extraction status, left eye: Secondary | ICD-10-CM | POA: Diagnosis not present

## 2022-06-10 DIAGNOSIS — H26493 Other secondary cataract, bilateral: Secondary | ICD-10-CM | POA: Diagnosis not present

## 2022-06-10 DIAGNOSIS — D3131 Benign neoplasm of right choroid: Secondary | ICD-10-CM | POA: Diagnosis not present

## 2022-06-11 DIAGNOSIS — M6752 Plica syndrome, left knee: Secondary | ICD-10-CM | POA: Diagnosis not present

## 2022-06-11 DIAGNOSIS — M659 Synovitis and tenosynovitis, unspecified: Secondary | ICD-10-CM | POA: Diagnosis not present

## 2022-06-17 DIAGNOSIS — M1712 Unilateral primary osteoarthritis, left knee: Secondary | ICD-10-CM | POA: Diagnosis not present

## 2022-06-18 ENCOUNTER — Ambulatory Visit (AMBULATORY_SURGERY_CENTER): Payer: Medicare Other | Admitting: Gastroenterology

## 2022-06-18 ENCOUNTER — Encounter: Payer: Self-pay | Admitting: Gastroenterology

## 2022-06-18 VITALS — BP 110/69 | HR 51 | Temp 96.8°F | Resp 14 | Ht 67.0 in | Wt 154.0 lb

## 2022-06-18 DIAGNOSIS — R195 Other fecal abnormalities: Secondary | ICD-10-CM

## 2022-06-18 DIAGNOSIS — D125 Benign neoplasm of sigmoid colon: Secondary | ICD-10-CM

## 2022-06-18 DIAGNOSIS — Z1211 Encounter for screening for malignant neoplasm of colon: Secondary | ICD-10-CM

## 2022-06-18 DIAGNOSIS — D123 Benign neoplasm of transverse colon: Secondary | ICD-10-CM

## 2022-06-18 DIAGNOSIS — K635 Polyp of colon: Secondary | ICD-10-CM | POA: Diagnosis not present

## 2022-06-18 MED ORDER — SODIUM CHLORIDE 0.9 % IV SOLN: 500.0000 mL | Freq: Once | INTRAVENOUS | Status: DC

## 2022-06-18 NOTE — Progress Notes (Signed)
To pacu, VSS. Report to [email or web address] 

## 2022-06-18 NOTE — Op Note (Signed)
[facility] Patient Name: Allison Harris Procedure Date: 06/18/2022 8:36 AM MRN: [id number] Endoscopist: Lorin Picket E. Tomasa Rand , MD, [id number] Age: 77 Referring MD:  Date of Birth: Jan 09, 1946 Gender: Female Account #: [id number] Procedure:                Colonoscopy Indications:              Positive Cologuard test Medicines:                Monitored Anesthesia Care Procedure:                Pre-Anesthesia Assessment:                           - Prior to the procedure, a History and Physical                            was performed, and patient medications and                            allergies were reviewed. The patient's tolerance of                            previous anesthesia was also reviewed. The risks                            and benefits of the procedure and the sedation                            options and risks were discussed with the patient.                            All questions were answered, and informed consent                            was obtained. Prior Anticoagulants: The patient has                            taken no anticoagulant or antiplatelet agents. ASA                            Grade Assessment: II - A patient with mild systemic                            disease. After reviewing the risks and benefits,                            the patient was deemed in satisfactory condition to                            undergo the procedure.                           After obtaining informed consent, the colonoscope  was passed under direct vision. Throughout the                            procedure, the patient's blood pressure, pulse, and                            oxygen saturations were monitored continuously. The                            Olympus PCF-H190DL [id number]) Colonoscope was                            introduced through the anus and advanced to the the                            terminal ileum, with  identification of the                            appendiceal orifice and IC valve. The CF [id number]                            #[id number] was introduced through the anus and                            advanced to sigmoid colon before aborting and                            switching to a pediatric colonoscope. The                            colonoscopy was somewhat difficult due to multiple                            diverticula in the colon and a tortuous colon.                            Successful completion of the procedure was aided by                            using manual pressure and withdrawing the scope and                            replacing with the pediatric endoscope. The patient                            tolerated the procedure well. The quality of the                            bowel preparation was adequate. The terminal ileum,                            ileocecal valve, appendiceal orifice, and rectum  were photographed. The bowel preparation used was                            SUTAB via split dose instruction. Scope In: 8:48:40 AM Scope Out: 9:08:53 AM Scope Withdrawal Time: 0 hours 14 minutes 32 seconds  Total Procedure Duration: 0 hours 20 minutes 13 seconds  Findings:                 The perianal and digital rectal examinations were                            normal. Pertinent negatives include normal                            sphincter tone and no palpable rectal lesions.                           A 10 mm polyp was found in the transverse colon.                            The polyp was mucous-capped and sessile. The polyp                            was removed with a cold snare. Resection and                            retrieval were complete. Estimated blood loss was                            minimal.                           A 4 mm polyp was found in the distal transverse                            colon. The polyp was flat. The polyp  was removed                            with a cold snare. Resection and retrieval were                            complete. Estimated blood loss was minimal.                           A 4 mm polyp was found in the sigmoid colon. The                            polyp was sessile. The polyp was removed with a                            cold snare. Resection and retrieval were complete.  Estimated blood loss was minimal.                           Many large-mouthed and small-mouthed diverticula                            were found in the sigmoid colon, descending colon,                            transverse colon, ascending colon and cecum. There                            was narrowing of the colon in association with the                            diverticular opening.                           The exam was otherwise normal throughout the                            examined colon.                           The terminal ileum appeared normal.                           The retroflexed view of the distal rectum and anal                            verge was normal and showed no anal or rectal                            abnormalities. Complications:            No immediate complications. Estimated Blood Loss:     Estimated blood loss was minimal. Impression:               - One 10 mm polyp in the transverse colon, removed                            with a cold snare. Resected and retrieved.                           - One 4 mm polyp in the distal transverse colon,                            removed with a cold snare. Resected and retrieved.                           - One 4 mm polyp in the sigmoid colon, removed with                            a cold snare. Resected and retrieved.                           -  Severe diverticulosis in the sigmoid colon, in                            the descending colon, in the transverse colon, in                            the  ascending colon and in the cecum. There was                            narrowing of the colon in association with the                            diverticular opening.                           - The examined portion of the ileum was normal.                           - The distal rectum and anal verge are normal on                            retroflexion view.                           - The GI Genius (intelligent endoscopy module),                            computer-aided polyp detection system powered by AI                            was utilized to detect colorectal polyps through                            enhanced visualization during colonoscopy. Recommendation:           - Patient has a contact number available for                            emergencies. The signs and symptoms of potential                            delayed complications were discussed with the                            patient. Return to normal activities tomorrow.                            Written discharge instructions were provided to the                            patient.                           - Resume previous diet.                           -  Continue present medications.                           - Await pathology results.                           - Repeat colonoscopy (date not yet determined) for                            surveillance based on pathology results. Zailynn Brandel E. Tomasa Rand, MD 06/18/2022 9:18:16 AM This report has been signed electronically.

## 2022-06-18 NOTE — Progress Notes (Signed)
History and Physical Interval Note:  06/18/2022 8:36 AM  Allison Harris Section  has presented today for endoscopic procedure(s), with the diagnosis of  Encounter Diagnosis  Name Primary?   Positive colorectal cancer screening using Cologuard test Yes  .  The various methods of evaluation and treatment have been discussed with the patient and/or family. After consideration of risks, benefits and other options for treatment, the patient has consented to  the endoscopic procedure(s).   The patient's history has been reviewed, patient examined, no change in status, stable for endoscopic procedure(s).  I have reviewed the patient's chart and labs.  Questions were answered to the patient's satisfaction.     Hanin Decook E. Tomasa Rand, MD [facility]

## 2022-06-18 NOTE — Patient Instructions (Signed)
Handout provided on polyps, diverticulosis and high fiber diet.  Resume previous diet. Recommend high fiber diet to prevent complications with diverticulosis.  Continue present medications.  Await pathology results.  Repeat colonoscopy (date to be determined) for surveillance based on pathology results.   YOU HAD AN ENDOSCOPIC PROCEDURE TODAY AT THE [facility]:   Refer to the procedure report that was given to you for any specific questions about what was found during the examination.  If the procedure report does not answer your questions, please call your gastroenterologist to clarify.  If you requested that your care partner not be given the details of your procedure findings, then the procedure report has been included in a sealed envelope for you to review at your convenience later.  YOU SHOULD EXPECT: Some feelings of bloating in the abdomen. Passage of more gas than usual.  Walking can help get rid of the air that was put into your GI tract during the procedure and reduce the bloating. If you had a lower endoscopy (such as a colonoscopy or flexible sigmoidoscopy) you may notice spotting of blood in your stool or on the toilet paper. If you underwent a bowel prep for your procedure, you may not have a normal bowel movement for a few days.  Please Note:  You might notice some irritation and congestion in your nose or some drainage.  This is from the oxygen used during your procedure.  There is no need for concern and it should clear up in a day or so.  SYMPTOMS TO REPORT IMMEDIATELY:  Following lower endoscopy (colonoscopy or flexible sigmoidoscopy):  Excessive amounts of blood in the stool  Significant tenderness or worsening of abdominal pains  Swelling of the abdomen that is new, acute  Fever of 100F or higher  For urgent or emergent issues, a gastroenterologist can be reached at any hour by calling [phone]. Do not use MyChart messaging for urgent concerns.     DIET:  We do recommend a small meal at first, but then you may proceed to your regular diet.  Drink plenty of fluids but you should avoid alcoholic beverages for 24 hours.  ACTIVITY:  You should plan to take it easy for the rest of today and you should NOT DRIVE or use heavy machinery until tomorrow (because of the sedation medicines used during the test).    FOLLOW UP: Our staff will call the number listed on your records the next business day following your procedure.  We will call around 7:15- 8:00 am to check on you and address any questions or concerns that you may have regarding the information given to you following your procedure. If we do not reach you, we will leave a message.     If any biopsies were taken you will be contacted by phone or by letter within the next 1-3 weeks.  Please call us at [phone] if you have not heard about the biopsies in 3 weeks.    SIGNATURES/CONFIDENTIALITY: You and/or your care partner have signed paperwork which will be entered into your electronic medical record.  These signatures attest to the fact that that the information above on your After Visit Summary has been reviewed and is understood.  Full responsibility of the confidentiality of this discharge information lies with you and/or your care-partner.

## 2022-06-19 ENCOUNTER — Telehealth: Payer: Self-pay | Admitting: *Deleted

## 2022-06-19 DIAGNOSIS — M1712 Unilateral primary osteoarthritis, left knee: Secondary | ICD-10-CM | POA: Diagnosis not present

## 2022-06-19 NOTE — Telephone Encounter (Signed)
  Follow up Call-     06/18/2022    7:56 AM  Call back number  Post procedure Call Back phone  # [phone]  Permission to leave phone message Yes     Patient questions:  Do you have a fever, pain , or abdominal swelling? No. Pain Score  0 *  Have you tolerated food without any problems? Yes.    Have you been able to return to your normal activities? Yes.    Do you have any questions about your discharge instructions: Diet   No. Medications  No. Follow up visit  No.  Do you have questions or concerns about your Care? No.  Actions: * If pain score is 4 or above: No action needed, pain <4.

## 2022-06-24 DIAGNOSIS — M1712 Unilateral primary osteoarthritis, left knee: Secondary | ICD-10-CM | POA: Diagnosis not present

## 2022-06-25 DIAGNOSIS — Z4789 Encounter for other orthopedic aftercare: Secondary | ICD-10-CM | POA: Diagnosis not present

## 2022-06-26 DIAGNOSIS — M1712 Unilateral primary osteoarthritis, left knee: Secondary | ICD-10-CM | POA: Diagnosis not present

## 2022-06-29 NOTE — Progress Notes (Signed)
Allison Harris,   All three polyps that I removed during your recent procedure were completely benign but were proven to be "pre-cancerous" polyps that MAY have grown into cancers if they had not been removed.  Studies shows that at least 20% of women over age 76 and 30% of men over age 35 have pre-cancerous polyps.  Based on current nationally recognized surveillance guidelines, I recommend that you have a repeat colonoscopy in 3 years.   Because colon cancer screening after age 7 is done on a case-by-case basis, taking into account the patient's risk factors for colon cancer, as well as comorbidities and life expectancy, I would recommend you make an appointment with me in 3 years to discuss the risks/benefits of further colon cancer screening.  If you develop any new rectal bleeding, abdominal pain or significant bowel habit changes, please contact me before then.

## 2022-07-01 ENCOUNTER — Encounter: Payer: Medicare Other | Admitting: Internal Medicine

## 2022-07-01 DIAGNOSIS — M1712 Unilateral primary osteoarthritis, left knee: Secondary | ICD-10-CM | POA: Diagnosis not present

## 2022-07-03 DIAGNOSIS — M1712 Unilateral primary osteoarthritis, left knee: Secondary | ICD-10-CM | POA: Diagnosis not present

## 2022-07-06 DIAGNOSIS — M1712 Unilateral primary osteoarthritis, left knee: Secondary | ICD-10-CM | POA: Diagnosis not present

## 2022-07-20 DIAGNOSIS — M1712 Unilateral primary osteoarthritis, left knee: Secondary | ICD-10-CM | POA: Diagnosis not present

## 2022-07-22 DIAGNOSIS — M1712 Unilateral primary osteoarthritis, left knee: Secondary | ICD-10-CM | POA: Diagnosis not present

## 2022-07-27 DIAGNOSIS — M1712 Unilateral primary osteoarthritis, left knee: Secondary | ICD-10-CM | POA: Diagnosis not present

## 2022-07-29 DIAGNOSIS — M1712 Unilateral primary osteoarthritis, left knee: Secondary | ICD-10-CM | POA: Diagnosis not present

## 2022-07-31 DIAGNOSIS — H26493 Other secondary cataract, bilateral: Secondary | ICD-10-CM | POA: Diagnosis not present

## 2022-08-04 DIAGNOSIS — M1712 Unilateral primary osteoarthritis, left knee: Secondary | ICD-10-CM | POA: Diagnosis not present

## 2022-08-05 DIAGNOSIS — M1712 Unilateral primary osteoarthritis, left knee: Secondary | ICD-10-CM | POA: Diagnosis not present

## 2022-08-10 DIAGNOSIS — M1712 Unilateral primary osteoarthritis, left knee: Secondary | ICD-10-CM | POA: Diagnosis not present

## 2022-08-12 DIAGNOSIS — E7800 Pure hypercholesterolemia, unspecified: Secondary | ICD-10-CM | POA: Diagnosis not present

## 2022-08-12 DIAGNOSIS — Z124 Encounter for screening for malignant neoplasm of cervix: Secondary | ICD-10-CM | POA: Diagnosis not present

## 2022-08-12 DIAGNOSIS — E039 Hypothyroidism, unspecified: Secondary | ICD-10-CM | POA: Diagnosis not present

## 2022-08-12 DIAGNOSIS — I10 Essential (primary) hypertension: Secondary | ICD-10-CM | POA: Diagnosis not present

## 2022-08-12 DIAGNOSIS — F4321 Adjustment disorder with depressed mood: Secondary | ICD-10-CM | POA: Diagnosis not present

## 2022-08-12 DIAGNOSIS — Z0000 Encounter for general adult medical examination without abnormal findings: Secondary | ICD-10-CM | POA: Diagnosis not present

## 2022-08-18 DIAGNOSIS — M1712 Unilateral primary osteoarthritis, left knee: Secondary | ICD-10-CM | POA: Diagnosis not present

## 2022-08-24 DIAGNOSIS — D1801 Hemangioma of skin and subcutaneous tissue: Secondary | ICD-10-CM | POA: Diagnosis not present

## 2022-08-24 DIAGNOSIS — L905 Scar conditions and fibrosis of skin: Secondary | ICD-10-CM | POA: Diagnosis not present

## 2022-08-24 DIAGNOSIS — L720 Epidermal cyst: Secondary | ICD-10-CM | POA: Diagnosis not present

## 2022-08-24 DIAGNOSIS — L821 Other seborrheic keratosis: Secondary | ICD-10-CM | POA: Diagnosis not present

## 2022-09-07 DIAGNOSIS — N390 Urinary tract infection, site not specified: Secondary | ICD-10-CM | POA: Diagnosis not present

## 2022-09-07 DIAGNOSIS — M25562 Pain in left knee: Secondary | ICD-10-CM | POA: Diagnosis not present

## 2022-09-07 DIAGNOSIS — J019 Acute sinusitis, unspecified: Secondary | ICD-10-CM | POA: Diagnosis not present

## 2022-10-07 DIAGNOSIS — H26492 Other secondary cataract, left eye: Secondary | ICD-10-CM | POA: Diagnosis not present

## 2022-10-27 DIAGNOSIS — E039 Hypothyroidism, unspecified: Secondary | ICD-10-CM | POA: Diagnosis not present

## 2022-10-28 DIAGNOSIS — M1811 Unilateral primary osteoarthritis of first carpometacarpal joint, right hand: Secondary | ICD-10-CM | POA: Diagnosis not present

## 2022-11-03 DIAGNOSIS — M1612 Unilateral primary osteoarthritis, left hip: Secondary | ICD-10-CM | POA: Diagnosis not present

## 2022-11-03 DIAGNOSIS — M1712 Unilateral primary osteoarthritis, left knee: Secondary | ICD-10-CM | POA: Diagnosis not present

## 2022-11-04 DIAGNOSIS — M1612 Unilateral primary osteoarthritis, left hip: Secondary | ICD-10-CM | POA: Diagnosis not present

## 2022-11-13 ENCOUNTER — Other Ambulatory Visit: Payer: Self-pay | Admitting: Family Medicine

## 2022-11-13 DIAGNOSIS — Z0000 Encounter for general adult medical examination without abnormal findings: Secondary | ICD-10-CM

## 2022-11-19 DIAGNOSIS — M25562 Pain in left knee: Secondary | ICD-10-CM | POA: Diagnosis not present

## 2022-11-25 ENCOUNTER — Ambulatory Visit
Admission: RE | Admit: 2022-11-25 | Discharge: 2022-11-25 | Disposition: A | Discharge status: Home / Self Care | Payer: Medicare Other | Source: Ambulatory Visit | Attending: Family Medicine | Admitting: Family Medicine

## 2022-11-25 DIAGNOSIS — Z1231 Encounter for screening mammogram for malignant neoplasm of breast: Secondary | ICD-10-CM | POA: Diagnosis not present

## 2022-11-25 DIAGNOSIS — Z0000 Encounter for general adult medical examination without abnormal findings: Secondary | ICD-10-CM

## 2022-12-14 DIAGNOSIS — M25562 Pain in left knee: Secondary | ICD-10-CM | POA: Diagnosis not present

## 2022-12-23 DIAGNOSIS — Z9842 Cataract extraction status, left eye: Secondary | ICD-10-CM | POA: Diagnosis not present

## 2022-12-23 DIAGNOSIS — Z9841 Cataract extraction status, right eye: Secondary | ICD-10-CM | POA: Diagnosis not present

## 2022-12-23 DIAGNOSIS — Z9889 Other specified postprocedural states: Secondary | ICD-10-CM | POA: Diagnosis not present

## 2022-12-23 DIAGNOSIS — D3131 Benign neoplasm of right choroid: Secondary | ICD-10-CM | POA: Diagnosis not present

## 2023-01-26 DIAGNOSIS — N390 Urinary tract infection, site not specified: Secondary | ICD-10-CM | POA: Diagnosis not present

## 2023-01-28 DIAGNOSIS — M5416 Radiculopathy, lumbar region: Secondary | ICD-10-CM | POA: Diagnosis not present

## 2023-01-28 DIAGNOSIS — M25562 Pain in left knee: Secondary | ICD-10-CM | POA: Diagnosis not present

## 2023-02-02 DIAGNOSIS — M545 Low back pain, unspecified: Secondary | ICD-10-CM | POA: Diagnosis not present

## 2023-02-11 DIAGNOSIS — F4321 Adjustment disorder with depressed mood: Secondary | ICD-10-CM | POA: Diagnosis not present

## 2023-02-11 DIAGNOSIS — E039 Hypothyroidism, unspecified: Secondary | ICD-10-CM | POA: Diagnosis not present

## 2023-02-11 DIAGNOSIS — E7800 Pure hypercholesterolemia, unspecified: Secondary | ICD-10-CM | POA: Diagnosis not present

## 2023-02-11 DIAGNOSIS — Z23 Encounter for immunization: Secondary | ICD-10-CM | POA: Diagnosis not present

## 2023-02-11 DIAGNOSIS — I10 Essential (primary) hypertension: Secondary | ICD-10-CM | POA: Diagnosis not present

## 2023-02-23 DIAGNOSIS — M545 Low back pain, unspecified: Secondary | ICD-10-CM | POA: Diagnosis not present

## 2023-02-25 ENCOUNTER — Other Ambulatory Visit (HOSPITAL_COMMUNITY): Payer: Self-pay | Admitting: Orthopedic Surgery

## 2023-02-25 DIAGNOSIS — M25562 Pain in left knee: Secondary | ICD-10-CM

## 2023-03-03 ENCOUNTER — Encounter (HOSPITAL_COMMUNITY)
Admission: RE | Admit: 2023-03-03 | Discharge: 2023-03-03 | Disposition: A | Discharge status: Home / Self Care | Payer: Medicare Other | Source: Ambulatory Visit | Attending: Orthopedic Surgery | Admitting: Orthopedic Surgery

## 2023-03-03 DIAGNOSIS — Z471 Aftercare following joint replacement surgery: Secondary | ICD-10-CM | POA: Diagnosis not present

## 2023-03-03 DIAGNOSIS — M25562 Pain in left knee: Secondary | ICD-10-CM | POA: Diagnosis not present

## 2023-03-03 DIAGNOSIS — Z96652 Presence of left artificial knee joint: Secondary | ICD-10-CM | POA: Diagnosis not present

## 2023-03-03 MED ORDER — TECHNETIUM TC 99M MEDRONATE IV KIT
22.0000 | PACK | Freq: Once | INTRAVENOUS | Status: AC | PRN
  Administered 2023-03-03: 22 via INTRAVENOUS

## 2023-03-11 DIAGNOSIS — M25562 Pain in left knee: Secondary | ICD-10-CM | POA: Diagnosis not present

## 2023-03-22 DIAGNOSIS — I10 Essential (primary) hypertension: Secondary | ICD-10-CM | POA: Diagnosis not present

## 2023-03-22 DIAGNOSIS — Z01818 Encounter for other preprocedural examination: Secondary | ICD-10-CM | POA: Diagnosis not present

## 2023-03-22 DIAGNOSIS — E7800 Pure hypercholesterolemia, unspecified: Secondary | ICD-10-CM | POA: Diagnosis not present

## 2023-03-22 DIAGNOSIS — E039 Hypothyroidism, unspecified: Secondary | ICD-10-CM | POA: Diagnosis not present

## 2023-03-24 DIAGNOSIS — M25552 Pain in left hip: Secondary | ICD-10-CM | POA: Diagnosis not present

## 2023-03-30 ENCOUNTER — Ambulatory Visit: Payer: Self-pay | Admitting: Emergency Medicine

## 2023-03-30 DIAGNOSIS — M25562 Pain in left knee: Secondary | ICD-10-CM | POA: Diagnosis not present

## 2023-03-30 DIAGNOSIS — G8929 Other chronic pain: Secondary | ICD-10-CM

## 2023-03-30 NOTE — H&P (View-Only) (Signed)
 TOTAL KNEE REVISION ADMISSION H&P  Patient is being admitted for left revision total knee arthroplasty.  Subjective:  Chief Complaint:left knee pain.  HPI: Allison Harris, 78 y.o. female, has a history of pain and functional disability in the left knee(s) following left total knee arthroplasty and patient has failed non-surgical conservative treatments for greater than 12 weeks to include NSAID's and/or analgesics, supervised PT with diminished ADL's post treatment, use of assistive devices, and activity modification.  Aspirations negative for evidence of infection.  The indications for the revision of the total knee arthroplasty include flexion instability and possible aseptic loosening of components.  Onset of symptoms was gradual starting 2 years ago with gradually worsening course since that time.  Prior procedures on the left knee(s) include arthroplasty.  Patient currently rates pain in the left knee(s) at 9 out of 10 with activity. There is night pain, worsening of pain with activity and weight bearing, pain that interferes with activities of daily living, and pain with passive range of motion.  Patient has evidence of decreased uptake around the knee on bone scan, though components appear generally appropriately placed via radiographs. This condition presents safety issues increasing the risk of falls.  There is no current active infection.  There are no active problems to display for this patient.  Past Medical History:  Diagnosis Date   Arthritis    Hypertension    Hypothyroidism     Past Surgical History:  Procedure Laterality Date   CATARACT EXTRACTION, BILATERAL     HAND SURGERY Right    TOTAL KNEE ARTHROPLASTY Left     Current Outpatient Medications  Medication Sig Dispense Refill Last Dose/Taking   lisinopril-hydrochlorothiazide (ZESTORETIC) 10-12.5 MG tablet Take 1 tablet by mouth daily.      Multiple Vitamin (MULTIVITAMIN) tablet Take 1 tablet by mouth daily. (Patient not  taking: Reported on 06/18/2022)      sertraline (ZOLOFT) 50 MG tablet Take 1 tablet by mouth daily.      SYNTHROID 25 MCG tablet Take 25 mcg by mouth every morning.      Thiamine HCl (VITAMIN B-1 PO) Take 1 tablet by mouth daily.      VITAMIN D PO Take 1 capsule by mouth daily.      No current facility-administered medications for this visit.   No Known Allergies  Social History   Tobacco Use   Smoking status: Never   Smokeless tobacco: Never  Substance Use Topics   Alcohol use: Not Currently    Family History  Problem Relation Age of Onset   Colon cancer Mother    Heart attack Father    Rheum arthritis Sister    Hypertension Brother    Hypertension Brother    Breast cancer Neg Hx    Rectal cancer Neg Hx    Esophageal cancer Neg Hx    Stomach cancer Neg Hx       Review of Systems  Musculoskeletal:  Positive for arthralgias.  All other systems reviewed and are negative.    Objective:  Physical Exam Constitutional:      General: She is not in acute distress.    Appearance: Normal appearance. She is not ill-appearing.  HENT:     Head: Normocephalic and atraumatic.     Right Ear: External ear normal.     Left Ear: External ear normal.     Nose: Nose normal.     Mouth/Throat:     Mouth: Mucous membranes are moist.     Pharynx:  Oropharynx is clear.  Eyes:     Extraocular Movements: Extraocular movements intact.     Conjunctiva/sclera: Conjunctivae normal.  Cardiovascular:     Rate and Rhythm: Normal rate and regular rhythm.     Pulses: Normal pulses.     Heart sounds: Normal heart sounds.  Pulmonary:     Effort: Pulmonary effort is normal.     Breath sounds: Normal breath sounds.  Abdominal:     General: Bowel sounds are normal.     Palpations: Abdomen is soft.     Tenderness: There is no abdominal tenderness.  Musculoskeletal:        General: Tenderness present.     Cervical back: Normal range of motion and neck supple.     Comments: TTP over medial and  lateral joint line.  No calf tenderness, swelling, or erythema.  Well healed incision, otherwise no overlying lesions of area of chief complaint.  Decreased strength and ROM due to elicited pain.  Pre-operative ROM 0-130.  Dorsiflexion and plantarflexion intact.  Stable to varus and valgus stress.  BLE appear grossly neurovascularly intact.  Gait mildly antalgic.   Skin:    General: Skin is warm and dry.  Neurological:     Mental Status: She is alert and oriented to person, place, and time. Mental status is at baseline.  Psychiatric:        Mood and Affect: Mood normal.        Behavior: Behavior normal.     Vital signs in last 24 hours: @VSRANGES @  Labs:  Estimated body mass index is 24.12 kg/m as calculated from the following:   Height as of 06/18/22: 5\' 7"  (1.702 m).   Weight as of 06/18/22: 69.9 kg.  Imaging Review Plain radiographs demonstrate generally appropriate placement of total knee arthroplasty components of left knee. The overall alignment is neutral. There is no overt evidence of loosening of the femoral and tibial components, though bone scan does demonstrate decreased uptake around the knee. The bone quality appears to be fair for age and reported activity level.     Assessment/Plan:  Painful left total knee arthroplsty, left knee(s) with flexion instability   The patient history, physical examination, clinical judgment of the provider and imaging studies are consistent with flexion instability of the left knee, previous total knee arthroplasty. Revision total knee arthroplasty is deemed medically necessary. The treatment options including medical management and revision arthroplasty were discussed at length. Discussed primary plan for simple poly exchange, however depending on balance and tightness in extension, may consider full revision including tibial and femoral components.  The risks and benefits of revision total knee arthroplasty were presented and reviewed. The  risks due to aseptic loosening, infection, stiffness, patella tracking problems, thromboembolic complications and other imponderables were discussed. The patient acknowledged the explanation, agreed to proceed with the plan and consent was signed. Patient is being admitted for inpatient treatment for surgery, pain control, PT, OT, prophylactic antibiotics, VTE prophylaxis, progressive ambulation and ADL's and discharge planning.The patient is planning to be discharged HHPT ([facility]).

## 2023-03-30 NOTE — H&P (Signed)
 TOTAL KNEE REVISION ADMISSION H&P  Patient is being admitted for left revision total knee arthroplasty.  Subjective:  Chief Complaint:left knee pain.  HPI: Allison Harris, 78 y.o. female, has a history of pain and functional disability in the left knee(s) following left total knee arthroplasty and patient has failed non-surgical conservative treatments for greater than 12 weeks to include NSAID's and/or analgesics, supervised PT with diminished ADL's post treatment, use of assistive devices, and activity modification.  Aspirations negative for evidence of infection.  The indications for the revision of the total knee arthroplasty include flexion instability and possible aseptic loosening of components.  Onset of symptoms was gradual starting 2 years ago with gradually worsening course since that time.  Prior procedures on the left knee(s) include arthroplasty.  Patient currently rates pain in the left knee(s) at 9 out of 10 with activity. There is night pain, worsening of pain with activity and weight bearing, pain that interferes with activities of daily living, and pain with passive range of motion.  Patient has evidence of decreased uptake around the knee on bone scan, though components appear generally appropriately placed via radiographs. This condition presents safety issues increasing the risk of falls.  There is no current active infection.  There are no active problems to display for this patient.  Past Medical History:  Diagnosis Date   Arthritis    Hypertension    Hypothyroidism     Past Surgical History:  Procedure Laterality Date   CATARACT EXTRACTION, BILATERAL     HAND SURGERY Right    TOTAL KNEE ARTHROPLASTY Left     Current Outpatient Medications  Medication Sig Dispense Refill Last Dose/Taking   lisinopril-hydrochlorothiazide (ZESTORETIC) 10-12.5 MG tablet Take 1 tablet by mouth daily.      Multiple Vitamin (MULTIVITAMIN) tablet Take 1 tablet by mouth daily. (Patient not  taking: Reported on 06/18/2022)      sertraline (ZOLOFT) 50 MG tablet Take 1 tablet by mouth daily.      SYNTHROID 25 MCG tablet Take 25 mcg by mouth every morning.      Thiamine HCl (VITAMIN B-1 PO) Take 1 tablet by mouth daily.      VITAMIN D PO Take 1 capsule by mouth daily.      No current facility-administered medications for this visit.   No Known Allergies  Social History   Tobacco Use   Smoking status: Never   Smokeless tobacco: Never  Substance Use Topics   Alcohol use: Not Currently    Family History  Problem Relation Age of Onset   Colon cancer Mother    Heart attack Father    Rheum arthritis Sister    Hypertension Brother    Hypertension Brother    Breast cancer Neg Hx    Rectal cancer Neg Hx    Esophageal cancer Neg Hx    Stomach cancer Neg Hx       Review of Systems  Musculoskeletal:  Positive for arthralgias.  All other systems reviewed and are negative.    Objective:  Physical Exam Constitutional:      General: She is not in acute distress.    Appearance: Normal appearance. She is not ill-appearing.  HENT:     Head: Normocephalic and atraumatic.     Right Ear: External ear normal.     Left Ear: External ear normal.     Nose: Nose normal.     Mouth/Throat:     Mouth: Mucous membranes are moist.     Pharynx:  Oropharynx is clear.  Eyes:     Extraocular Movements: Extraocular movements intact.     Conjunctiva/sclera: Conjunctivae normal.  Cardiovascular:     Rate and Rhythm: Normal rate and regular rhythm.     Pulses: Normal pulses.     Heart sounds: Normal heart sounds.  Pulmonary:     Effort: Pulmonary effort is normal.     Breath sounds: Normal breath sounds.  Abdominal:     General: Bowel sounds are normal.     Palpations: Abdomen is soft.     Tenderness: There is no abdominal tenderness.  Musculoskeletal:        General: Tenderness present.     Cervical back: Normal range of motion and neck supple.     Comments: TTP over medial and  lateral joint line.  No calf tenderness, swelling, or erythema.  Well healed incision, otherwise no overlying lesions of area of chief complaint.  Decreased strength and ROM due to elicited pain.  Pre-operative ROM 0-130.  Dorsiflexion and plantarflexion intact.  Stable to varus and valgus stress.  BLE appear grossly neurovascularly intact.  Gait mildly antalgic.   Skin:    General: Skin is warm and dry.  Neurological:     Mental Status: She is alert and oriented to person, place, and time. Mental status is at baseline.  Psychiatric:        Mood and Affect: Mood normal.        Behavior: Behavior normal.     Vital signs in last 24 hours: @VSRANGES @  Labs:  Estimated body mass index is 24.12 kg/m as calculated from the following:   Height as of 06/18/22: 5\' 7"  (1.702 m).   Weight as of 06/18/22: 69.9 kg.  Imaging Review Plain radiographs demonstrate generally appropriate placement of total knee arthroplasty components of left knee. The overall alignment is neutral. There is no overt evidence of loosening of the femoral and tibial components, though bone scan does demonstrate decreased uptake around the knee. The bone quality appears to be fair for age and reported activity level.     Assessment/Plan:  Painful left total knee arthroplsty, left knee(s) with flexion instability   The patient history, physical examination, clinical judgment of the provider and imaging studies are consistent with flexion instability of the left knee, previous total knee arthroplasty. Revision total knee arthroplasty is deemed medically necessary. The treatment options including medical management and revision arthroplasty were discussed at length. Discussed primary plan for simple poly exchange, however depending on balance and tightness in extension, may consider full revision including tibial and femoral components.  The risks and benefits of revision total knee arthroplasty were presented and reviewed. The  risks due to aseptic loosening, infection, stiffness, patella tracking problems, thromboembolic complications and other imponderables were discussed. The patient acknowledged the explanation, agreed to proceed with the plan and consent was signed. Patient is being admitted for inpatient treatment for surgery, pain control, PT, OT, prophylactic antibiotics, VTE prophylaxis, progressive ambulation and ADL's and discharge planning.The patient is planning to be discharged HHPT ([facility]).

## 2023-04-07 DIAGNOSIS — Z9841 Cataract extraction status, right eye: Secondary | ICD-10-CM | POA: Diagnosis not present

## 2023-04-07 DIAGNOSIS — Z9842 Cataract extraction status, left eye: Secondary | ICD-10-CM | POA: Diagnosis not present

## 2023-04-07 DIAGNOSIS — D3131 Benign neoplasm of right choroid: Secondary | ICD-10-CM | POA: Diagnosis not present

## 2023-04-07 NOTE — Progress Notes (Addendum)
 PCP - Merri Brunette, MD [facility] Cardiologist - no  PPM/ICD -  Device Orders -  Rep Notified -   Chest x-ray -  EKG - 04-08-23 epic Stress Test -  ECHO -  Cardiac Cath -   Sleep Study -  CPAP -   Fasting Blood Sugar -  Checks Blood Sugar _____ times a day  Blood Thinner Instructions: Aspirin Instructions:  ERAS Protcol - PRE-SURGERY Ensure   COVID vaccine -yes  Activity--Able to climb a flight of stairs without CP or SOB Anesthesia review: HTN  Patient denies shortness of breath, fever, cough and chest pain at PAT appointment   All instructions explained to the patient, with a verbal understanding of the material. Patient agrees to go over the instructions while at home for a better understanding. Patient also instructed to self quarantine after being tested for COVID-19. The opportunity to ask questions was provided.

## 2023-04-07 NOTE — Patient Instructions (Signed)
 SURGICAL WAITING ROOM VISITATION  Patients having surgery or a procedure may have no more than 2 support people in the waiting area - these visitors may rotate.    Children under the age of 101 must have an adult with them who is not the patient.  Due to an increase in RSV and influenza rates and associated hospitalizations, children ages 33 and under may not visit patients in [facility] hospitals.  Visitors with respiratory illnesses are discouraged from visiting and should remain at home.  If the patient needs to stay at the hospital during part of their recovery, the visitor guidelines for inpatient rooms apply. Pre-op nurse will coordinate an appropriate time for 1 support person to accompany patient in pre-op.  This support person may not rotate.    Please refer to the [facility] website for the visitor guidelines for Inpatients (after your surgery is over and you are in a regular room).       Your procedure is scheduled on: 04-21-23   Report to [facility] Main Entrance    Report to admitting at     12:20  PM   Call this number if you have problems the morning of surgery [phone]   Do not eat food :After Midnight.   After Midnight you may have the following liquids until _11:45 _____ AM/  DAY OF SURGERY  then nothing by [employer] Non-Citrus Juices (without pulp, NO RED-Apple, White grape, White cranberry) Black Coffee (NO MILK/CREAM OR CREAMERS, sugar ok)  Clear Tea (NO MILK/CREAM OR CREAMERS, sugar ok) regular and decaf                             Plain Jell-O (NO RED)                                           Fruit ices (not with fruit pulp, NO RED)                                     Popsicles (NO RED)                                                               Sports drinks like Gatorade (NO RED)                      The day of surgery:  Drink ONE (1) Pre-Surgery Clear Ensure BY 1145 AM the morning of surgery. Drink in one sitting. Do not sip.   This drink was given to you during your hospital  pre-op appointment visit. Nothing else to drink after completing the  Pre-Surgery Clear Ensure .          If you have questions, please contact your surgeon's office.   FOLLOW  ANY ADDITIONAL PRE OP INSTRUCTIONS YOU RECEIVED FROM YOUR SURGEON'S OFFICE!!!     Oral Hygiene is also important to reduce your risk of infection.  Remember - BRUSH YOUR TEETH THE MORNING OF SURGERY WITH YOUR REGULAR TOOTHPASTE  DENTURES WILL BE REMOVED PRIOR TO SURGERY PLEASE DO NOT APPLY "Poly grip" OR ADHESIVES!!!   Do NOT smoke after Midnight   Stop all vitamins and herbal supplements 7 days before surgery.   Take these medicines the morning of surgery with A SIP OF WATER: Synthroid, zoloft    Bring CPAP mask and tubing day of surgery.                              You may not have any metal on your body including hair pins, jewelry, and body piercing             Do not wear make-up, lotions, powders, perfumes/cologne, or deodorant  Do not wear nail polish including gel and S&S, artificial/acrylic nails, or any other type of covering on natural nails including finger and toenails. If you have artificial nails, gel coating, etc. that needs to be removed by a nail salon please have this removed prior to surgery or surgery may need to be canceled/ delayed if the surgeon/ anesthesia feels like they are unable to be safely monitored.   Do not shave  5 days prior to surgery.    Do not bring valuables to the hospital. [facility] IS NOT             RESPONSIBLE   FOR VALUABLES.   Contacts, glasses, dentures or bridgework may not be worn into surgery.   Bring small overnight bag day of surgery.   DO NOT BRING YOUR HOME MEDICATIONS TO THE HOSPITAL. PHARMACY WILL DISPENSE MEDICATIONS LISTED ON YOUR MEDICATION LIST TO YOU DURING YOUR ADMISSION IN THE HOSPITAL!    Patients discharged on the day of surgery will not be allowed  to drive home.  Someone NEEDS to stay with you for the first 24 hours after anesthesia.   Special Instructions: Bring a copy of your healthcare power of attorney and living will documents the day of surgery if you haven't scanned them before.              Please read over the following fact sheets you were given: IF YOU HAVE QUESTIONS ABOUT YOUR PRE-OP INSTRUCTIONS PLEASE CALL [phone]    If you test positive for Covid or have been in contact with anyone that has tested positive in the last 10 days please notify you surgeon.      Pre-operative 5 CHG Bath Instructions   You can play a key role in reducing the risk of infection after surgery. Your skin needs to be as free of germs as possible. You can reduce the number of germs on your skin by washing with CHG (chlorhexidine gluconate) soap before surgery. CHG is an antiseptic soap that kills germs and continues to kill germs even after washing.   DO NOT use if you have an allergy to chlorhexidine/CHG or antibacterial soaps. If your skin becomes reddened or irritated, stop using the CHG and notify one of our RNs at [phone].   Please shower with the CHG soap starting 4 days before surgery using the following schedule:     Please keep in mind the following:  DO NOT shave, including legs and underarms, starting the day of your first shower.   You may shave your face at any point before/day of surgery.  Place clean sheets on your bed the day you start using CHG soap.  Use a clean washcloth (not used since being washed) for each shower. DO NOT sleep with pets once you start using the CHG.   CHG Shower Instructions:  If you choose to wash your hair and private area, wash first with your normal shampoo/soap.  After you use shampoo/soap, rinse your hair and body thoroughly to remove shampoo/soap residue.  Turn the water OFF and apply about 3 tablespoons (45 ml) of CHG soap to a CLEAN washcloth.  Apply CHG soap ONLY FROM YOUR NECK DOWN  TO YOUR TOES (washing for 3-5 minutes)  DO NOT use CHG soap on face, private areas, open wounds, or sores.  Pay special attention to the area where your surgery is being performed.  If you are having back surgery, having someone wash your back for you may be helpful. Wait 2 minutes after CHG soap is applied, then you may rinse off the CHG soap.  Pat dry with a clean towel  Put on clean clothes/pajamas   If you choose to wear lotion, please use ONLY the CHG-compatible lotions on the back of this paper.     Additional instructions for the day of surgery: DO NOT APPLY any lotions, deodorants, cologne, or perfumes.   Put on clean/comfortable clothes.  Brush your teeth.  Ask your nurse before applying any prescription medications to the skin.      CHG Compatible Lotions   Aveeno Moisturizing lotion  Cetaphil Moisturizing Cream  Cetaphil Moisturizing Lotion  Clairol Herbal Essence Moisturizing Lotion, Dry Skin  Clairol Herbal Essence Moisturizing Lotion, Extra Dry Skin  Clairol Herbal Essence Moisturizing Lotion, Normal Skin  Curel Age Defying Therapeutic Moisturizing Lotion with Alpha Hydroxy  Curel Extreme Care Body Lotion  Curel Soothing Hands Moisturizing Hand Lotion  Curel Therapeutic Moisturizing Cream, Fragrance-Free  Curel Therapeutic Moisturizing Lotion, Fragrance-Free  Curel Therapeutic Moisturizing Lotion, Original Formula  Eucerin Daily Replenishing Lotion  Eucerin Dry Skin Therapy Plus Alpha Hydroxy Crme  Eucerin Dry Skin Therapy Plus Alpha Hydroxy Lotion  Eucerin Original Crme  Eucerin Original Lotion  Eucerin Plus Crme Eucerin Plus Lotion  Eucerin TriLipid Replenishing Lotion  Keri Anti-Bacterial Hand Lotion  Keri Deep Conditioning Original Lotion Dry Skin Formula Softly Scented  Keri Deep Conditioning Original Lotion, Fragrance Free Sensitive Skin Formula  Keri Lotion Fast Absorbing Fragrance Free Sensitive Skin Formula  Keri Lotion Fast Absorbing Softly  Scented Dry Skin Formula  Keri Original Lotion  Keri Skin Renewal Lotion Keri Silky Smooth Lotion  Keri Silky Smooth Sensitive Skin Lotion  Nivea Body Creamy Conditioning Oil  Nivea Body Extra Enriched Teacher, adult education Moisturizing Lotion Nivea Crme  Nivea Skin Firming Lotion  NutraDerm 30 Skin Lotion  NutraDerm Skin Lotion  NutraDerm Therapeutic Skin Cream  NutraDerm Therapeutic Skin Lotion  ProShield Protective Hand Cream  WHAT IS A BLOOD TRANSFUSION? Blood Transfusion Information  A transfusion is the replacement of blood or some of its parts. Blood is made up of multiple cells which provide different functions. Red blood cells carry oxygen and are used for blood loss replacement. White blood cells fight against infection. Platelets control bleeding. Plasma helps clot blood. Other blood products are available for specialized needs, such as hemophilia or other clotting disorders. BEFORE THE TRANSFUSION  Who gives blood for transfusions?  Healthy volunteers who are fully evaluated to make sure their blood is safe. This is blood bank blood. Transfusion therapy is the safest it has ever been in the practice of medicine. Before blood  is taken from a donor, a complete history is taken to make sure that person has no history of diseases nor engages in risky social behavior (examples are intravenous drug use or sexual activity with multiple partners). The donor's travel history is screened to minimize risk of transmitting infections, such as malaria. The donated blood is tested for signs of infectious diseases, such as HIV and hepatitis. The blood is then tested to be sure it is compatible with you in order to minimize the chance of a transfusion reaction. If you or a relative donates blood, this is often done in anticipation of surgery and is not appropriate for emergency situations. It takes many days to process the donated blood. RISKS AND  COMPLICATIONS Although transfusion therapy is very safe and saves many lives, the main dangers of transfusion include:  Getting an infectious disease. Developing a transfusion reaction. This is an allergic reaction to something in the blood you were given. Every precaution is taken to prevent this. The decision to have a blood transfusion has been considered carefully by your caregiver before blood is given. Blood is not given unless the benefits outweigh the risks. AFTER THE TRANSFUSION Right after receiving a blood transfusion, you will usually feel much better and more energetic. This is especially true if your red blood cells have gotten low (anemic). The transfusion raises the level of the red blood cells which carry oxygen, and this usually causes an energy increase. The nurse administering the transfusion will monitor you carefully for complications. HOME CARE INSTRUCTIONS  No special instructions are needed after a transfusion. You may find your energy is better. Speak with your caregiver about any limitations on activity for underlying diseases you may have. SEEK MEDICAL CARE IF:  Your condition is not improving after your transfusion. You develop redness or irritation at the intravenous (IV) site. SEEK IMMEDIATE MEDICAL CARE IF:  Any of the following symptoms occur over the next 12 hours: Shaking chills. You have a temperature by mouth above 102 F (38.9 C), not controlled by medicine. Chest, back, or muscle pain. People around you feel you are not acting correctly or are confused. Shortness of breath or difficulty breathing. Dizziness and fainting. You get a rash or develop hives. You have a decrease in urine output. Your urine turns a dark color or changes to pink, red, or brown. Any of the following symptoms occur over the next 10 days: You have a temperature by mouth above 102 F (38.9 C), not controlled by medicine. Shortness of breath. Weakness after normal activity. The  white part of the eye turns yellow (jaundice). You have a decrease in the amount of urine or are urinating less often. Your urine turns a dark color or changes to pink, red, or brown. Document Released: 01/17/2000 Document Revised: 04/13/2011 Document Reviewed: 09/05/2007 [employer] Patient Information 2014 [place], [place].  _______________________________________________________________________  Incentive Spirometer  An incentive spirometer is a tool that can help keep your lungs clear and active. This tool measures how well you are filling your lungs with each breath. Taking long deep breaths may help reverse or decrease the chance of developing breathing (pulmonary) problems (especially infection) following: A long period of time when you are unable to move or be active. BEFORE THE PROCEDURE  If the spirometer includes an indicator to show your best effort, your nurse or respiratory therapist will set it to a desired goal. If possible, sit up straight or lean slightly forward. Try not to slouch. Hold the incentive spirometer  in an upright position. INSTRUCTIONS FOR USE  Sit on the edge of your bed if possible, or sit up as far as you can in bed or on a chair. Hold the incentive spirometer in an upright position. Breathe out normally. Place the mouthpiece in your mouth and seal your lips tightly around it. Breathe in slowly and as deeply as possible, raising the piston or the ball toward the top of the column. Hold your breath for 3-5 seconds or for as long as possible. Allow the piston or ball to fall to the bottom of the column. Remove the mouthpiece from your mouth and breathe out normally. Rest for a few seconds and repeat Steps 1 through 7 at least 10 times every 1-2 hours when you are awake. Take your time and take a few normal breaths between deep breaths. The spirometer may include an indicator to show your best effort. Use the indicator as a goal to work toward during each  repetition. After each set of 10 deep breaths, practice coughing to be sure your lungs are clear. If you have an incision (the cut made at the time of surgery), support your incision when coughing by placing a pillow or rolled up towels firmly against it. Once you are able to get out of bed, walk around indoors and cough well. You may stop using the incentive spirometer when instructed by your caregiver.  RISKS AND COMPLICATIONS Take your time so you do not get dizzy or light-headed. If you are in pain, you may need to take or ask for pain medication before doing incentive spirometry. It is harder to take a deep breath if you are having pain. AFTER USE Rest and breathe slowly and easily. It can be helpful to keep track of a log of your progress. Your caregiver can provide you with a simple table to help with this. If you are using the spirometer at home, follow these instructions: SEEK MEDICAL CARE IF:  You are having difficultly using the spirometer. You have trouble using the spirometer as often as instructed. Your pain medication is not giving enough relief while using the spirometer. You develop fever of 100.5 F (38.1 C) or higher. SEEK IMMEDIATE MEDICAL CARE IF:  You cough up bloody sputum that had not been present before. You develop fever of 102 F (38.9 C) or greater. You develop worsening pain at or near the incision site. MAKE SURE YOU:  Understand these instructions. Will watch your condition. Will get help right away if you are not doing well or get worse. Document Released: 06/01/2006 Document Revised: 04/13/2011 Document Reviewed: 08/02/2006 [facility] Patient Information 2014 [place], [place].   ________________________________________________________________________

## 2023-04-08 ENCOUNTER — Encounter (HOSPITAL_COMMUNITY)
Admission: RE | Admit: 2023-04-08 | Discharge: 2023-04-08 | Disposition: A | Discharge status: Home / Self Care | Payer: Medicare Other | Source: Ambulatory Visit | Attending: Orthopedic Surgery | Admitting: Orthopedic Surgery

## 2023-04-08 ENCOUNTER — Other Ambulatory Visit: Payer: Self-pay

## 2023-04-08 ENCOUNTER — Encounter (HOSPITAL_COMMUNITY): Payer: Self-pay

## 2023-04-08 VITALS — BP 129/81 | HR 66 | Temp 98.2°F | Resp 16 | Ht 66.5 in | Wt 156.0 lb

## 2023-04-08 DIAGNOSIS — T8484XA Pain due to internal orthopedic prosthetic devices, implants and grafts, initial encounter: Secondary | ICD-10-CM | POA: Diagnosis not present

## 2023-04-08 DIAGNOSIS — Z96652 Presence of left artificial knee joint: Secondary | ICD-10-CM | POA: Diagnosis not present

## 2023-04-08 DIAGNOSIS — Z01818 Encounter for other preprocedural examination: Secondary | ICD-10-CM | POA: Diagnosis not present

## 2023-04-08 DIAGNOSIS — Z01810 Encounter for preprocedural cardiovascular examination: Secondary | ICD-10-CM | POA: Diagnosis not present

## 2023-04-08 DIAGNOSIS — G8929 Other chronic pain: Secondary | ICD-10-CM | POA: Diagnosis not present

## 2023-04-08 DIAGNOSIS — Z01812 Encounter for preprocedural laboratory examination: Secondary | ICD-10-CM | POA: Diagnosis not present

## 2023-04-08 DIAGNOSIS — M25562 Pain in left knee: Secondary | ICD-10-CM | POA: Insufficient documentation

## 2023-04-08 DIAGNOSIS — R9431 Abnormal electrocardiogram [ECG] [EKG]: Secondary | ICD-10-CM | POA: Insufficient documentation

## 2023-04-08 LAB — CBC WITH DIFFERENTIAL/PLATELET
Abs Immature Granulocytes: 0.01 10*3/uL (ref 0.00–0.07)
Basophils Absolute: 0 10*3/uL (ref 0.0–0.1)
Basophils Relative: 0 %
Eosinophils Absolute: 0 10*3/uL (ref 0.0–0.5)
Eosinophils Relative: 0 %
HCT: 42.2 % (ref 36.0–46.0)
Hemoglobin: 13.8 g/dL (ref 12.0–15.0)
Immature Granulocytes: 0 %
Lymphocytes Relative: 26 %
Lymphs Abs: 2 10*3/uL (ref 0.7–4.0)
MCH: 29.5 pg (ref 26.0–34.0)
MCHC: 32.7 g/dL (ref 30.0–36.0)
MCV: 90.2 fL (ref 80.0–100.0)
Monocytes Absolute: 0.7 10*3/uL (ref 0.1–1.0)
Monocytes Relative: 9 %
Neutro Abs: 4.9 10*3/uL (ref 1.7–7.7)
Neutrophils Relative %: 65 %
Platelets: 263 10*3/uL (ref 150–400)
RBC: 4.68 MIL/uL (ref 3.87–5.11)
RDW: 14.5 % (ref 11.5–15.5)
WBC: 7.6 10*3/uL (ref 4.0–10.5)
nRBC: 0 % (ref 0.0–0.2)

## 2023-04-08 LAB — COMPREHENSIVE METABOLIC PANEL
ALT: 20 U/L (ref 0–44)
AST: 26 U/L (ref 15–41)
Albumin: 4.2 g/dL (ref 3.5–5.0)
Alkaline Phosphatase: 61 U/L (ref 38–126)
Anion gap: 8 (ref 5–15)
BUN: 23 mg/dL (ref 8–23)
CO2: 26 mmol/L (ref 22–32)
Calcium: 9.7 mg/dL (ref 8.9–10.3)
Chloride: 98 mmol/L (ref 98–111)
Creatinine, Ser: 0.67 mg/dL (ref 0.44–1.00)
GFR, Estimated: >60 mL/min (ref >60)
Glucose, Bld: 130 mg/dL — ABNORMAL HIGH (ref 70–99)
Potassium: 3.5 mmol/L (ref 3.5–5.1)
Sodium: 132 mmol/L — ABNORMAL LOW (ref 135–145)
Total Bilirubin: 0.8 mg/dL (ref 0.0–1.2)
Total Protein: 7.1 g/dL (ref 6.5–8.1)

## 2023-04-08 LAB — TYPE AND SCREEN
ABO/RH(D): B POS
Antibody Screen: NEGATIVE

## 2023-04-08 LAB — SURGICAL PCR SCREEN
MRSA, PCR: NEGATIVE
Staphylococcus aureus: NEGATIVE

## 2023-04-11 ENCOUNTER — Other Ambulatory Visit: Payer: Self-pay

## 2023-04-11 ENCOUNTER — Ambulatory Visit
Admission: RE | Admit: 2023-04-11 | Discharge: 2023-04-11 | Disposition: A | Discharge status: Home / Self Care | Source: Ambulatory Visit | Attending: Family Medicine | Admitting: Family Medicine

## 2023-04-11 VITALS — BP 126/89 | HR 89 | Temp 98.6°F | Resp 19

## 2023-04-11 DIAGNOSIS — R350 Frequency of micturition: Secondary | ICD-10-CM | POA: Diagnosis not present

## 2023-04-11 DIAGNOSIS — N3001 Acute cystitis with hematuria: Secondary | ICD-10-CM

## 2023-04-11 LAB — POCT URINALYSIS DIP (MANUAL ENTRY)
Bilirubin, UA: NEGATIVE
Glucose, UA: NEGATIVE mg/dL
Ketones, POC UA: NEGATIVE mg/dL
Nitrite, UA: NEGATIVE
Protein Ur, POC: NEGATIVE mg/dL
Spec Grav, UA: 1.02 (ref 1.010–1.025)
Urobilinogen, UA: 0.2 U/dL
pH, UA: 5.5 (ref 5.0–8.0)

## 2023-04-11 MED ORDER — CEFDINIR 300 MG PO CAPS: 300.0000 mg | ORAL_CAPSULE | Freq: Two times a day (BID) | ORAL | 0 refills | Status: AC

## 2023-04-11 NOTE — Discharge Instructions (Addendum)
 Advised patient to take medication as directed with food to completion.  Encouraged to increase daily water intake to 64 ounces per day while taking this medication.  Advised we will follow-up with urine culture results once received.  Advised if symptoms worsen and/or unresolved please follow-up with your PCP, [facility], or here for further evaluation.

## 2023-04-11 NOTE — ED Provider Notes (Signed)
 Allison Harris CARE    CSN: [id number] Arrival date & time: 04/11/23  1151      History   Chief Complaint Chief Complaint  Patient presents with   Urinary Frequency    UTI when stressed or nervous. Knee revision surgery in 10 days do want to get this cleared up asap. Cipro only antibiotic that works. - Entered by patient    HPI Allison Harris is a 78 y.o. female.   HPI 78 year old female presents with urinary frequency since this morning.  Patient reports history of UTIs.  PMH significant for HTN and hypothyroidism.  Patient reports upcoming knee surgery on Friday, 04/18/2023.  Past Medical History:  Diagnosis Date   Arthritis    Hypertension    Hypothyroidism     There are no active problems to display for this patient.   Past Surgical History:  Procedure Laterality Date   CATARACT EXTRACTION, BILATERAL     HAND SURGERY Right    TOTAL KNEE ARTHROPLASTY Left     OB History   No obstetric history on file.      Home Medications    Prior to Admission medications   Medication Sig Start Date End Date Taking? Authorizing Provider  cefdinir (OMNICEF) 300 MG capsule Take 1 capsule (300 mg total) by mouth 2 (two) times daily for 7 days. 04/11/23 04/18/23 Yes Trevor Iha, FNP  cyanocobalamin (VITAMIN B12) 1000 MCG tablet Take 1,000 mcg by mouth daily.    [provider]  lisinopril-hydrochlorothiazide (ZESTORETIC) 10-12.5 MG tablet Take 1 tablet by mouth daily. 08/16/15   [provider]  sertraline (ZOLOFT) 50 MG tablet Take 50 mg by mouth daily. 05/24/20   [provider]  sodium fluoride-calcium carbonate (FLORICAL) 8.3-364 MG CAPS capsule Take 1 capsule by mouth daily.    [provider]  SYNTHROID 25 MCG tablet Take 25 mcg by mouth every morning. 12/15/18   [provider]  vitamin E 180 MG (400 UNITS) capsule Take 400 Units by mouth daily.    [provider]    Family History Family History  Problem Relation  Age of Onset   Colon cancer Mother    Heart attack Father    Rheum arthritis Sister    Hypertension Brother    Hypertension Brother    Breast cancer Neg Hx    Rectal cancer Neg Hx    Esophageal cancer Neg Hx    Stomach cancer Neg Hx     Social History Social History   Tobacco Use   Smoking status: Former    Types: Cigarettes   Smokeless tobacco: Never   Tobacco comments:    Quit when she was 33 y never a heavy smoker  Advertising account planner   Vaping status: Never Used  Substance Use Topics   Alcohol use: Not Currently    Comment: 4 x a week 1 glass of wine   Drug use: Never     Allergies   Patient has no known allergies.   Review of Systems Review of Systems  Genitourinary:  Positive for frequency.  All other systems reviewed and are negative.    Physical Exam Triage Vital Signs ED Triage Vitals [04/11/23 1213]  Encounter Vitals Group     BP 126/89     Systolic BP Percentile      Diastolic BP Percentile      Pulse Rate 89     Resp 19     Temp 98.6 F (37 C)     Temp  src      SpO2 98 %     Weight      Height      Head Circumference      Peak Flow      Pain Score 0     Pain Loc      Pain Education      Exclude from Growth Chart    No data found.  Updated Vital Signs BP 126/89   Pulse 89   Temp 98.6 F (37 C)   Resp 19   SpO2 98%   Visual Acuity Right Eye Distance:   Left Eye Distance:   Bilateral Distance:    Right Eye Near:   Left Eye Near:    Bilateral Near:     Physical Exam Vitals and nursing note reviewed.  Constitutional:      Appearance: Normal appearance. She is normal weight.  HENT:     Head: Normocephalic and atraumatic.     Mouth/Throat:     Mouth: Mucous membranes are moist.     Pharynx: Oropharynx is clear.  Eyes:     Extraocular Movements: Extraocular movements intact.     Conjunctiva/sclera: Conjunctivae normal.     Pupils: Pupils are equal, round, and reactive to light.  Cardiovascular:     Rate and Rhythm: Normal rate  and regular rhythm.     Pulses: Normal pulses.     Heart sounds: Normal heart sounds.  Pulmonary:     Effort: Pulmonary effort is normal.     Breath sounds: Normal breath sounds. No wheezing, rhonchi or rales.  Musculoskeletal:        General: Normal range of motion.  Skin:    General: Skin is warm and dry.  Neurological:     General: No focal deficit present.     Mental Status: She is alert and oriented to person, place, and time. Mental status is at baseline.  Psychiatric:        Mood and Affect: Mood normal.        Behavior: Behavior normal.      UC Treatments / Results  Labs (all labs ordered are listed, but only abnormal results are displayed) Labs Reviewed  POCT URINALYSIS DIP (MANUAL ENTRY) - Abnormal; Notable for the following components:      Result Value   Clarity, UA cloudy (*)    Blood, UA trace-intact (*)    Leukocytes, UA Small (1+) (*)    All other components within normal limits  URINE CULTURE    EKG   Radiology No results found.  Procedures Procedures (including critical care time)  Medications Ordered in UC Medications - No data to display  Initial Impression / Assessment and Plan / UC Course  I have reviewed the triage vital signs and the nursing notes.  Pertinent labs & imaging results that were available during my care of the patient were reviewed by me and considered in my medical decision making (see chart for details).     MDM: 1.  Acute cystitis with hematuria-UA revealed above, urine culture ordered, Rx'd cefdinir 300 mg capsule: Take 1 capsule twice daily x 7 days. 2.  Urinary frequency-UA revealed above, urine culture ordered, Rx'd cefdinir 300 mg capsule: Take 1 capsule twice daily x 7 days. Advised patient to take medication as directed with food to completion.  Encouraged to increase daily water intake to 64 ounces per day while taking this medication.  Advised we will follow-up with urine culture results once received.  Advised  if  symptoms worsen and/or unresolved please follow-up with your PCP, [facility], or here for further evaluation. Final Clinical Impressions(s) / UC Diagnoses   Final diagnoses:  Acute cystitis with hematuria  Urinary frequency     Discharge Instructions      Advised patient to take medication as directed with food to completion.  Encouraged to increase daily water intake to 64 ounces per day while taking this medication.  Advised we will follow-up with urine culture results once received.  Advised if symptoms worsen and/or unresolved please follow-up with your PCP, [facility], or here for further evaluation.     ED Prescriptions     Medication Sig Dispense Auth. Provider   cefdinir (OMNICEF) 300 MG capsule Take 1 capsule (300 mg total) by mouth 2 (two) times daily for 7 days. 14 capsule Trevor Iha, FNP      PDMP not reviewed this encounter.   Trevor Iha, FNP 04/11/23 1246

## 2023-04-11 NOTE — ED Triage Notes (Signed)
 Pt presents to uc with co of urinary frequency since this morning. Pt reports she has hx of utis when stressed and has a knee surgery on friday

## 2023-04-12 LAB — URINE CULTURE: Culture: NO GROWTH

## 2023-04-20 NOTE — Anesthesia Preprocedure Evaluation (Signed)
 Anesthesia Evaluation  Patient identified by MRN, date of birth, ID band Patient awake    Reviewed: Allergy & Precautions, NPO status , Patient's Chart, lab work & pertinent test results, reviewed documented beta blocker date and time   Airway Mallampati: II  TM Distance: >3 FB     Dental no notable dental hx. (+) Teeth Intact, Dental Advisory Given, Caps   Pulmonary former smoker   Pulmonary exam normal breath sounds clear to auscultation       Cardiovascular hypertension, Pt. on medications Normal cardiovascular exam Rhythm:Regular Rate:Normal     Neuro/Psych negative neurological ROS  negative psych ROS   GI/Hepatic negative GI ROS, Neg liver ROS,,,  Endo/Other  Hypothyroidism    Renal/GU negative Renal ROS  negative genitourinary   Musculoskeletal  (+) Arthritis , Osteoarthritis,  Failed Left TKR   Abdominal   Peds  Hematology negative hematology ROS (+)   Anesthesia Other Findings   Reproductive/Obstetrics                              Anesthesia Physical Anesthesia Plan  ASA: 3  Anesthesia Plan: Regional   Post-op Pain Management: Regional block* and Minimal or no pain anticipated   Induction: Intravenous  PONV Risk Score and Plan: 4 or greater and Treatment may vary due to age or medical condition, Ondansetron and Propofol infusion  Airway Management Planned: Natural Airway and Simple Face Mask  Additional Equipment: None  Intra-op Plan:   Post-operative Plan:   Informed Consent: I have reviewed the patients History and Physical, chart, labs and discussed the procedure including the risks, benefits and alternatives for the proposed anesthesia with the patient or authorized representative who has indicated his/her understanding and acceptance.     Dental advisory given  Plan Discussed with: CRNA and Anesthesiologist  Anesthesia Plan Comments:          Anesthesia Quick Evaluation

## 2023-04-21 ENCOUNTER — Encounter (HOSPITAL_COMMUNITY): Payer: Self-pay | Admitting: Orthopedic Surgery

## 2023-04-21 ENCOUNTER — Inpatient Hospital Stay (HOSPITAL_COMMUNITY)

## 2023-04-21 ENCOUNTER — Other Ambulatory Visit: Payer: Self-pay

## 2023-04-21 ENCOUNTER — Inpatient Hospital Stay (HOSPITAL_COMMUNITY)
Admission: RE | Admit: 2023-04-21 | Discharge: 2023-04-22 | DRG: 489 | Disposition: A | Discharge status: Home / Self Care | Payer: Medicare Other | Attending: Orthopedic Surgery | Admitting: Orthopedic Surgery

## 2023-04-21 ENCOUNTER — Inpatient Hospital Stay (HOSPITAL_COMMUNITY): Payer: Self-pay | Admitting: Anesthesiology

## 2023-04-21 ENCOUNTER — Encounter (HOSPITAL_COMMUNITY)
Admission: RE | Disposition: A | Discharge status: Home / Self Care | Payer: Self-pay | Source: Home / Self Care | Attending: Orthopedic Surgery

## 2023-04-21 DIAGNOSIS — Z471 Aftercare following joint replacement surgery: Secondary | ICD-10-CM | POA: Diagnosis not present

## 2023-04-21 DIAGNOSIS — G8918 Other acute postprocedural pain: Secondary | ICD-10-CM | POA: Diagnosis not present

## 2023-04-21 DIAGNOSIS — M25562 Pain in left knee: Secondary | ICD-10-CM | POA: Diagnosis present

## 2023-04-21 DIAGNOSIS — Z9842 Cataract extraction status, left eye: Secondary | ICD-10-CM | POA: Diagnosis not present

## 2023-04-21 DIAGNOSIS — I10 Essential (primary) hypertension: Secondary | ICD-10-CM

## 2023-04-21 DIAGNOSIS — M25462 Effusion, left knee: Secondary | ICD-10-CM | POA: Diagnosis not present

## 2023-04-21 DIAGNOSIS — Z79899 Other long term (current) drug therapy: Secondary | ICD-10-CM

## 2023-04-21 DIAGNOSIS — Z96659 Presence of unspecified artificial knee joint: Principal | ICD-10-CM

## 2023-04-21 DIAGNOSIS — Z8249 Family history of ischemic heart disease and other diseases of the circulatory system: Secondary | ICD-10-CM | POA: Diagnosis not present

## 2023-04-21 DIAGNOSIS — Z96652 Presence of left artificial knee joint: Secondary | ICD-10-CM | POA: Diagnosis not present

## 2023-04-21 DIAGNOSIS — T84018A Broken internal joint prosthesis, other site, initial encounter: Secondary | ICD-10-CM | POA: Diagnosis not present

## 2023-04-21 DIAGNOSIS — Y831 Surgical operation with implant of artificial internal device as the cause of abnormal reaction of the patient, or of later complication, without mention of misadventure at the time of the procedure: Secondary | ICD-10-CM | POA: Diagnosis not present

## 2023-04-21 DIAGNOSIS — T8489XA Other specified complication of internal orthopedic prosthetic devices, implants and grafts, initial encounter: Secondary | ICD-10-CM | POA: Diagnosis not present

## 2023-04-21 DIAGNOSIS — Y792 Prosthetic and other implants, materials and accessory orthopedic devices associated with adverse incidents: Secondary | ICD-10-CM | POA: Diagnosis present

## 2023-04-21 DIAGNOSIS — Z79890 Hormone replacement therapy (postmenopausal): Secondary | ICD-10-CM | POA: Diagnosis not present

## 2023-04-21 DIAGNOSIS — T84023A Instability of internal left knee prosthesis, initial encounter: Principal | ICD-10-CM | POA: Diagnosis present

## 2023-04-21 DIAGNOSIS — R609 Edema, unspecified: Secondary | ICD-10-CM | POA: Diagnosis not present

## 2023-04-21 DIAGNOSIS — T84093A Other mechanical complication of internal left knee prosthesis, initial encounter: Secondary | ICD-10-CM

## 2023-04-21 DIAGNOSIS — E039 Hypothyroidism, unspecified: Secondary | ICD-10-CM

## 2023-04-21 DIAGNOSIS — Z9841 Cataract extraction status, right eye: Secondary | ICD-10-CM | POA: Diagnosis not present

## 2023-04-21 HISTORY — PX: TOTAL KNEE REVISION: SHX996

## 2023-04-21 LAB — ABO/RH: ABO/RH(D): B POS

## 2023-04-21 SURGERY — TOTAL KNEE REVISION
Anesthesia: Monitor Anesthesia Care | Site: Knee | Laterality: Left

## 2023-04-21 MED ORDER — ACETAMINOPHEN 10 MG/ML IV SOLN: 1000.0000 mg | Freq: Once | INTRAVENOUS | Status: DC | PRN

## 2023-04-21 MED ORDER — BUPIVACAINE-EPINEPHRINE (PF) 0.25% -1:200000 IJ SOLN
INTRAMUSCULAR | Status: AC
  Filled 2023-04-21: qty 30

## 2023-04-21 MED ORDER — PHENOL 1.4 % MT LIQD: 1.0000 | OROMUCOSAL | Status: DC | PRN

## 2023-04-21 MED ORDER — LISINOPRIL-HYDROCHLOROTHIAZIDE 10-12.5 MG PO TABS
1.0000 | ORAL_TABLET | Freq: Every day | ORAL | Status: DC
Start: 2023-04-22 — End: 2023-04-21

## 2023-04-21 MED ORDER — ZOLPIDEM TARTRATE 5 MG PO TABS: 5.0000 mg | ORAL_TABLET | Freq: Every evening | ORAL | Status: DC | PRN

## 2023-04-21 MED ORDER — ROPIVACAINE HCL 5 MG/ML IJ SOLN
INTRAMUSCULAR | Status: DC | PRN
  Administered 2023-04-21: 30 mL via PERINEURAL

## 2023-04-21 MED ORDER — MIDAZOLAM HCL 2 MG/2ML IJ SOLN
INTRAMUSCULAR | Status: AC
  Filled 2023-04-21: qty 2

## 2023-04-21 MED ORDER — FENTANYL CITRATE PF 50 MCG/ML IJ SOSY
50.0000 ug | PREFILLED_SYRINGE | INTRAMUSCULAR | Status: DC
  Administered 2023-04-21: 50 ug via INTRAVENOUS
  Filled 2023-04-21: qty 2

## 2023-04-21 MED ORDER — FENTANYL CITRATE (PF) 100 MCG/2ML IJ SOLN
INTRAMUSCULAR | Status: AC
  Filled 2023-04-21: qty 2

## 2023-04-21 MED ORDER — DIPHENHYDRAMINE HCL 12.5 MG/5ML PO ELIX: 12.5000 mg | ORAL_SOLUTION | ORAL | Status: DC | PRN

## 2023-04-21 MED ORDER — MIDAZOLAM HCL 5 MG/5ML IJ SOLN
INTRAMUSCULAR | Status: DC | PRN
  Administered 2023-04-21: 1 mg via INTRAVENOUS

## 2023-04-21 MED ORDER — OXYCODONE HCL 5 MG PO TABS: 5.0000 mg | ORAL_TABLET | Freq: Once | ORAL | Status: DC | PRN

## 2023-04-21 MED ORDER — DOCUSATE SODIUM 100 MG PO CAPS
100.0000 mg | ORAL_CAPSULE | Freq: Two times a day (BID) | ORAL | Status: DC
Start: 2023-04-21 — End: 2023-04-22
  Administered 2023-04-21 – 2023-04-22 (×2): 100 mg via ORAL
  Filled 2023-04-21 (×2): qty 1

## 2023-04-21 MED ORDER — TRANEXAMIC ACID-NACL 1000-0.7 MG/100ML-% IV SOLN
1000.0000 mg | INTRAVENOUS | Status: AC
  Administered 2023-04-21: 1000 mg via INTRAVENOUS
  Filled 2023-04-21: qty 100

## 2023-04-21 MED ORDER — MENTHOL 3 MG MT LOZG: 1.0000 | LOZENGE | OROMUCOSAL | Status: DC | PRN

## 2023-04-21 MED ORDER — PANTOPRAZOLE SODIUM 40 MG PO TBEC
40.0000 mg | DELAYED_RELEASE_TABLET | Freq: Every day | ORAL | Status: DC
  Administered 2023-04-21 – 2023-04-22 (×2): 40 mg via ORAL
  Filled 2023-04-21 (×2): qty 1

## 2023-04-21 MED ORDER — BUPIVACAINE LIPOSOME 1.3 % IJ SUSP: 20.0000 mL | Freq: Once | INTRAMUSCULAR | Status: DC

## 2023-04-21 MED ORDER — CELECOXIB 100 MG PO CAPS: 100.0000 mg | ORAL_CAPSULE | Freq: Two times a day (BID) | ORAL | 0 refills | Status: AC

## 2023-04-21 MED ORDER — KETOROLAC TROMETHAMINE 15 MG/ML IJ SOLN
7.5000 mg | Freq: Four times a day (QID) | INTRAMUSCULAR | Status: DC
  Administered 2023-04-21 – 2023-04-22 (×3): 7.5 mg via INTRAVENOUS
  Filled 2023-04-21 (×3): qty 1

## 2023-04-21 MED ORDER — SODIUM CHLORIDE 0.9 % IV SOLN: INTRAVENOUS | Status: DC

## 2023-04-21 MED ORDER — DEXAMETHASONE SODIUM PHOSPHATE 10 MG/ML IJ SOLN
8.0000 mg | Freq: Once | INTRAMUSCULAR | Status: AC
  Administered 2023-04-21: 8 mg via INTRAVENOUS

## 2023-04-21 MED ORDER — HYDROMORPHONE HCL 1 MG/ML IJ SOLN: 0.5000 mg | INTRAMUSCULAR | Status: DC | PRN

## 2023-04-21 MED ORDER — SODIUM CHLORIDE (PF) 0.9 % IJ SOLN
INTRAMUSCULAR | Status: DC | PRN
  Administered 2023-04-21: 60 mL

## 2023-04-21 MED ORDER — 0.9 % SODIUM CHLORIDE (POUR BTL) OPTIME
TOPICAL | Status: DC | PRN
  Administered 2023-04-21: 1000 mL

## 2023-04-21 MED ORDER — GLYCOPYRROLATE 0.2 MG/ML IJ SOLN
INTRAMUSCULAR | Status: DC | PRN
Start: 2023-04-21 — End: 2023-04-21
  Administered 2023-04-21 (×2): .1 mg via INTRAVENOUS

## 2023-04-21 MED ORDER — LISINOPRIL 10 MG PO TABS
10.0000 mg | ORAL_TABLET | Freq: Every day | ORAL | Status: DC
  Administered 2023-04-22: 10 mg via ORAL
  Filled 2023-04-21: qty 1

## 2023-04-21 MED ORDER — CHLORHEXIDINE GLUCONATE 0.12 % MT SOLN
15.0000 mL | Freq: Once | OROMUCOSAL | Status: AC
  Administered 2023-04-21: 15 mL via OROMUCOSAL

## 2023-04-21 MED ORDER — POLYETHYLENE GLYCOL 3350 17 G PO PACK: 17.0000 g | PACK | Freq: Every day | ORAL | Status: DC | PRN

## 2023-04-21 MED ORDER — ACETAMINOPHEN 325 MG PO TABS: 325.0000 mg | ORAL_TABLET | Freq: Four times a day (QID) | ORAL | Status: DC | PRN

## 2023-04-21 MED ORDER — LEVOTHYROXINE SODIUM 25 MCG PO TABS
25.0000 ug | ORAL_TABLET | Freq: Every day | ORAL | Status: DC
  Administered 2023-04-22: 25 ug via ORAL
  Filled 2023-04-21: qty 1

## 2023-04-21 MED ORDER — ACETAMINOPHEN 500 MG PO TABS
1000.0000 mg | ORAL_TABLET | Freq: Once | ORAL | Status: AC
  Administered 2023-04-21: 1000 mg via ORAL
  Filled 2023-04-21: qty 2

## 2023-04-21 MED ORDER — LACTATED RINGERS IV SOLN: INTRAVENOUS | Status: AC

## 2023-04-21 MED ORDER — ONDANSETRON HCL 4 MG PO TABS: 4.0000 mg | ORAL_TABLET | Freq: Three times a day (TID) | ORAL | 0 refills | Status: AC | PRN

## 2023-04-21 MED ORDER — METHOCARBAMOL 500 MG PO TABS: 500.0000 mg | ORAL_TABLET | Freq: Four times a day (QID) | ORAL | Status: DC | PRN

## 2023-04-21 MED ORDER — ACETAMINOPHEN 500 MG PO TABS
1000.0000 mg | ORAL_TABLET | Freq: Four times a day (QID) | ORAL | Status: DC
  Administered 2023-04-21 – 2023-04-22 (×3): 1000 mg via ORAL
  Filled 2023-04-21 (×3): qty 2

## 2023-04-21 MED ORDER — ISOPROPYL ALCOHOL 70 % SOLN
Status: DC | PRN
  Administered 2023-04-21: 1 via TOPICAL

## 2023-04-21 MED ORDER — METHOCARBAMOL 1000 MG/10ML IJ SOLN: 500.0000 mg | Freq: Four times a day (QID) | INTRAMUSCULAR | Status: DC | PRN

## 2023-04-21 MED ORDER — PROPOFOL 500 MG/50ML IV EMUL
INTRAVENOUS | Status: DC | PRN
  Administered 2023-04-21 (×2): 20 mg via INTRAVENOUS
  Administered 2023-04-21: 80 ug/kg/min via INTRAVENOUS

## 2023-04-21 MED ORDER — VANCOMYCIN HCL 1000 MG IV SOLR
INTRAVENOUS | Status: AC
  Filled 2023-04-21: qty 20

## 2023-04-21 MED ORDER — SODIUM CHLORIDE 0.9 % IR SOLN
Status: DC | PRN
  Administered 2023-04-21: 1000 mL

## 2023-04-21 MED ORDER — CEFAZOLIN SODIUM-DEXTROSE 2-4 GM/100ML-% IV SOLN
2.0000 g | Freq: Four times a day (QID) | INTRAVENOUS | Status: AC
  Administered 2023-04-21 – 2023-04-22 (×2): 2 g via INTRAVENOUS
  Filled 2023-04-21 (×2): qty 100

## 2023-04-21 MED ORDER — OXYCODONE HCL 5 MG PO TABS
5.0000 mg | ORAL_TABLET | ORAL | Status: DC | PRN
  Administered 2023-04-21 – 2023-04-22 (×4): 5 mg via ORAL
  Filled 2023-04-21 (×4): qty 1

## 2023-04-21 MED ORDER — BUPIVACAINE LIPOSOME 1.3 % IJ SUSP
INTRAMUSCULAR | Status: AC
  Filled 2023-04-21: qty 20

## 2023-04-21 MED ORDER — ASPIRIN 81 MG PO TBEC
81.0000 mg | DELAYED_RELEASE_TABLET | Freq: Two times a day (BID) | ORAL | Status: AC
Start: 2023-04-22 — End: 2023-05-20

## 2023-04-21 MED ORDER — HYDROMORPHONE HCL 1 MG/ML IJ SOLN: 0.2500 mg | INTRAMUSCULAR | Status: DC | PRN

## 2023-04-21 MED ORDER — OXYCODONE HCL 5 MG/5ML PO SOLN: 5.0000 mg | Freq: Once | ORAL | Status: DC | PRN

## 2023-04-21 MED ORDER — ORAL CARE MOUTH RINSE: 15.0000 mL | Freq: Once | OROMUCOSAL | Status: AC

## 2023-04-21 MED ORDER — PROPOFOL 10 MG/ML IV BOLUS
INTRAVENOUS | Status: AC
  Filled 2023-04-21: qty 20

## 2023-04-21 MED ORDER — ASPIRIN 81 MG PO CHEW
81.0000 mg | CHEWABLE_TABLET | Freq: Two times a day (BID) | ORAL | Status: DC
  Administered 2023-04-21 – 2023-04-22 (×2): 81 mg via ORAL
  Filled 2023-04-21 (×2): qty 1

## 2023-04-21 MED ORDER — ONDANSETRON HCL 4 MG PO TABS: 4.0000 mg | ORAL_TABLET | Freq: Four times a day (QID) | ORAL | Status: DC | PRN

## 2023-04-21 MED ORDER — LACTATED RINGERS IV SOLN: INTRAVENOUS | Status: DC

## 2023-04-21 MED ORDER — CEFAZOLIN SODIUM-DEXTROSE 2-4 GM/100ML-% IV SOLN
2.0000 g | INTRAVENOUS | Status: AC
  Administered 2023-04-21: 2 g via INTRAVENOUS
  Filled 2023-04-21: qty 100

## 2023-04-21 MED ORDER — OXYCODONE HCL 5 MG PO TABS: 5.0000 mg | ORAL_TABLET | ORAL | 0 refills | Status: AC | PRN

## 2023-04-21 MED ORDER — PHENYLEPHRINE HCL-NACL 20-0.9 MG/250ML-% IV SOLN
INTRAVENOUS | Status: DC | PRN
  Administered 2023-04-21: 35 ug/min via INTRAVENOUS

## 2023-04-21 MED ORDER — ONDANSETRON HCL 4 MG/2ML IJ SOLN: 4.0000 mg | Freq: Four times a day (QID) | INTRAMUSCULAR | Status: DC | PRN

## 2023-04-21 MED ORDER — ONDANSETRON HCL 4 MG/2ML IJ SOLN: 4.0000 mg | Freq: Once | INTRAMUSCULAR | Status: DC | PRN

## 2023-04-21 MED ORDER — SODIUM CHLORIDE (PF) 0.9 % IJ SOLN
INTRAMUSCULAR | Status: AC
  Filled 2023-04-21: qty 30

## 2023-04-21 MED ORDER — ACETAMINOPHEN 500 MG PO TABS: 1000.0000 mg | ORAL_TABLET | Freq: Three times a day (TID) | ORAL | Status: AC | PRN

## 2023-04-21 MED ORDER — METHOCARBAMOL 500 MG PO TABS: 500.0000 mg | ORAL_TABLET | Freq: Three times a day (TID) | ORAL | 0 refills | Status: AC | PRN

## 2023-04-21 MED ORDER — HYDROCHLOROTHIAZIDE 12.5 MG PO TABS
12.5000 mg | ORAL_TABLET | Freq: Every day | ORAL | Status: DC
Start: 2023-04-22 — End: 2023-04-22
  Administered 2023-04-22: 12.5 mg via ORAL
  Filled 2023-04-21: qty 1

## 2023-04-21 MED ORDER — ISOPROPYL ALCOHOL 70 % SOLN
Status: AC
  Filled 2023-04-21: qty 480

## 2023-04-21 MED ORDER — SERTRALINE HCL 50 MG PO TABS
50.0000 mg | ORAL_TABLET | Freq: Every day | ORAL | Status: DC
  Administered 2023-04-21: 50 mg via ORAL
  Filled 2023-04-21: qty 1

## 2023-04-21 MED ORDER — BUPIVACAINE IN DEXTROSE 0.75-8.25 % IT SOLN
INTRATHECAL | Status: DC | PRN
Start: 2023-04-21 — End: 2023-04-21
  Administered 2023-04-21: 1.8 mL via INTRATHECAL

## 2023-04-21 MED ORDER — SERTRALINE HCL 50 MG PO TABS: 50.0000 mg | ORAL_TABLET | Freq: Every day | ORAL | Status: DC

## 2023-04-21 MED ORDER — POVIDONE-IODINE 10 % EX SWAB: 2.0000 | Freq: Once | CUTANEOUS | Status: DC

## 2023-04-21 MED ORDER — PHENYLEPHRINE HCL (PRESSORS) 10 MG/ML IV SOLN
INTRAVENOUS | Status: DC | PRN
  Administered 2023-04-21: 100 ug via INTRAVENOUS

## 2023-04-21 MED ORDER — POLYETHYLENE GLYCOL 3350 17 G PO PACK: 17.0000 g | PACK | Freq: Every day | ORAL | 0 refills | Status: DC

## 2023-04-21 MED ORDER — OMEPRAZOLE 40 MG PO CPDR: 40.0000 mg | DELAYED_RELEASE_CAPSULE | Freq: Every day | ORAL | 0 refills | Status: DC

## 2023-04-21 SURGICAL SUPPLY — 51 items
ATTUNE PSRP INSR SZ6 8 KNEE (Insert) IMPLANT
BAG COUNTER SPONGE SURGICOUNT (BAG) IMPLANT
BLADE SAG 18X100X1.27 (BLADE) ×1 IMPLANT
BLADE SAW SAG 35X64 .89 (BLADE) ×1 IMPLANT
BLADE SAW SGTL 81X20 HD (BLADE) IMPLANT
BNDG COHESIVE 4X5 TAN STRL LF (GAUZE/BANDAGES/DRESSINGS) ×1 IMPLANT
BNDG ELASTIC 6X10 VLCR STRL LF (GAUZE/BANDAGES/DRESSINGS) ×1 IMPLANT
BOWL SMART MIX CTS (DISPOSABLE) ×1 IMPLANT
CANISTER WOUND CARE 500ML ATS (WOUND CARE) ×1 IMPLANT
CHLORAPREP W/TINT 26 (MISCELLANEOUS) ×2 IMPLANT
CNTNR URN SCR LID CUP LEK RST (MISCELLANEOUS) IMPLANT
COVER SURGICAL LIGHT HANDLE (MISCELLANEOUS) ×1 IMPLANT
CUFF TRNQT CYL 34X4.125X (TOURNIQUET CUFF) ×1 IMPLANT
DERMABOND ADVANCED .7 DNX12 (GAUZE/BANDAGES/DRESSINGS) IMPLANT
DRAPE INCISE IOBAN 85X60 (DRAPES) ×1 IMPLANT
DRAPE SHEET LG 3/4 BI-LAMINATE (DRAPES) ×1 IMPLANT
DRAPE U-SHAPE 47X51 STRL (DRAPES) ×1 IMPLANT
DRESSING PEEL AND PLAC PRVNA20 (GAUZE/BANDAGES/DRESSINGS) ×1 IMPLANT
DRSG AQUACEL AG ADV 3.5X10 (GAUZE/BANDAGES/DRESSINGS) IMPLANT
DRSG PEEL AND PLACE PREVENA 20 (GAUZE/BANDAGES/DRESSINGS) ×1 IMPLANT
GAUZE SPONGE 4X4 12PLY STRL (GAUZE/BANDAGES/DRESSINGS) ×1 IMPLANT
GLOVE BIO SURGEON STRL SZ 6.5 (GLOVE) ×2 IMPLANT
GLOVE BIOGEL PI IND STRL 6.5 (GLOVE) ×1 IMPLANT
GLOVE BIOGEL PI IND STRL 8 (GLOVE) ×1 IMPLANT
GLOVE SURG ORTHO 8.0 STRL STRW (GLOVE) ×2 IMPLANT
GOWN STRL REUS W/ TWL XL LVL3 (GOWN DISPOSABLE) ×2 IMPLANT
HOLDER FOLEY CATH W/STRAP (MISCELLANEOUS) IMPLANT
HOOD PEEL AWAY T7 (MISCELLANEOUS) ×3 IMPLANT
KIT DRSG PREVENA PLUS 7DAY 125 (MISCELLANEOUS) ×1 IMPLANT
KIT TURNOVER KIT A (KITS) IMPLANT
MANIFOLD NEPTUNE II (INSTRUMENTS) ×1 IMPLANT
MARKER SKIN DUAL TIP RULER LAB (MISCELLANEOUS) ×1 IMPLANT
NS IRRIG 1000ML POUR BTL (IV SOLUTION) ×1 IMPLANT
PACK TOTAL KNEE CUSTOM (KITS) ×1 IMPLANT
PROTECTOR NERVE ULNAR (MISCELLANEOUS) ×1 IMPLANT
SET HNDPC FAN SPRY TIP SCT (DISPOSABLE) ×1 IMPLANT
SOLUTION IRRIG SURGIPHOR (IV SOLUTION) IMPLANT
SPIKE FLUID TRANSFER (MISCELLANEOUS) ×1 IMPLANT
STRIP CLOSURE SKIN 1/2X4 (GAUZE/BANDAGES/DRESSINGS) IMPLANT
SUT ETHILON 3 0 PS 1 (SUTURE) ×4 IMPLANT
SUT MNCRL AB 3-0 PS2 18 (SUTURE) ×1 IMPLANT
SUT STRATAFIX 0 PDS 27 VIOLET (SUTURE) ×1 IMPLANT
SUT STRATAFIX 14 PDO 48 VLT (SUTURE) ×1 IMPLANT
SUT STRATAFIX PDO 1 14 VIOLET (SUTURE) ×1 IMPLANT
SUT VIC AB 2-0 CT2 27 (SUTURE) ×2 IMPLANT
SUTURE STRATFX 0 PDS 27 VIOLET (SUTURE) ×1 IMPLANT
SYR 50ML LL SCALE MARK (SYRINGE) ×1 IMPLANT
TRAY FOLEY MTR SLVR 16FR STAT (SET/KITS/TRAYS/PACK) ×1 IMPLANT
TUBE SUCTION HIGH CAP CLEAR NV (SUCTIONS) ×1 IMPLANT
UNDERPAD 30X36 HEAVY ABSORB (UNDERPADS AND DIAPERS) ×1 IMPLANT
WRAP KNEE MAXI GEL POST OP (GAUZE/BANDAGES/DRESSINGS) IMPLANT

## 2023-04-21 NOTE — Anesthesia Postprocedure Evaluation (Signed)
 Anesthesia Post Note  Patient: Oren Section  Procedure(s) Performed: LEFT KNEE REVISION OF POLY COMPONENT (Left: Knee)     Patient location during evaluation: PACU Anesthesia Type: Spinal and MAC Level of consciousness: oriented and awake and alert Pain management: pain level controlled Vital Signs Assessment: post-procedure vital signs reviewed and stable Respiratory status: spontaneous breathing, respiratory function stable and nonlabored ventilation Cardiovascular status: blood pressure returned to baseline and stable Postop Assessment: no headache, no backache, no apparent nausea or vomiting, spinal receding and patient able to bend at knees Anesthetic complications: no   No notable events documented.  Last Vitals:  Vitals:   04/21/23 1715 04/21/23 1730  BP: 118/71 122/73  Pulse: (!) 57 64  Resp: 12 15  Temp:    SpO2: 96% 99%    Last Pain:  Vitals:   04/21/23 1730  TempSrc:   PainSc: 0-No pain                 Tavious Griesinger A.

## 2023-04-21 NOTE — Anesthesia Procedure Notes (Signed)
 Spinal  Patient location during procedure: OR Start time: 04/21/2023 2:55 PM End time: 04/21/2023 2:59 PM Reason for block: surgical anesthesia Staffing Performed: anesthesiologist  Anesthesiologist: Mal Amabile, MD Performed by: Mal Amabile, MD Authorized by: Mal Amabile, MD   Preanesthetic Checklist Completed: patient identified, IV checked, site marked, risks and benefits discussed, surgical consent, monitors and equipment checked, pre-op evaluation and timeout performed Spinal Block Patient position: sitting Prep: DuraPrep and site prepped and draped Patient monitoring: heart rate, cardiac monitor, continuous pulse ox and blood pressure Approach: midline Location: L3-4 Injection technique: single-shot Needle Needle type: Pencan  Needle gauge: 24 G Needle length: 9 cm Needle insertion depth: 6 cm Assessment Sensory level: T4 Events: CSF return Additional Notes Patient tolerated procedure well. Adequate sensory level.

## 2023-04-21 NOTE — Progress Notes (Signed)
 Orthopedic Tech Progress Note Patient Details:  Allison Harris 08/28/1945 [id number]  Ortho Devices Type of Ortho Device: Bone foam zero knee Ortho Device/Splint Location: left Ortho Device/Splint Interventions: Ordered, Application, Adjustment   Post Interventions Patient Tolerated: Well Instructions Provided: Adjustment of device, Care of device  Kizzie Fantasia 04/21/2023, 5:04 PM

## 2023-04-21 NOTE — Discharge Instructions (Signed)
 INSTRUCTIONS AFTER JOINT REPLACEMENT   Remove items at home which could result in a fall. This includes throw rugs or furniture in walking pathways ICE to the affected joint every three hours while awake for 30 minutes at a time, for at least the first 3-5 days, and then as needed for pain and swelling.  Continue to use ice for pain and swelling. You may notice swelling that will progress down to the foot and ankle.  This is normal after surgery.  Elevate your leg when you are not up walking on it.   Continue to use the breathing machine you got in the hospital (incentive spirometer) which will help keep your temperature down.  It is common for your temperature to cycle up and down following surgery, especially at night when you are not up moving around and exerting yourself.  The breathing machine keeps your lungs expanded and your temperature down.  DIET:  As you were doing prior to hospitalization, we recommend a well-balanced diet.  DRESSING / WOUND CARE / SHOWERING:  Keep the surgical dressing until follow up.  Keep the dressing dry when you shower, NO BATHS AT THIS TIME.  Please note: IF THE DRESSING FALLS OFF or the wound gets wet inside, call the office and change the dressing with sterile gauze.  Please use good hand washing techniques before changing the dressing.  Do not use any lotions or creams on the incision until instructed by your surgeon.    ACTIVITY  Increase activity slowly as tolerated, but follow the weight bearing instructions below.   No driving for 6 weeks or until further direction given by your physician.  You cannot drive while taking narcotics.  No lifting or carrying greater than 10 lbs. until further directed by your surgeon. Avoid periods of inactivity such as sitting longer than an hour when not asleep. This helps prevent blood clots.  You may return to work once you are authorized by your doctor.   WEIGHT BEARING: Weight bearing as tolerated with assist device  (walker, cane, etc) as directed, use it as long as suggested by your surgeon or therapist, typically at least 4-6 weeks.  EXERCISES  Results after joint replacement surgery are often greatly improved when you follow the exercise, range of motion and muscle strengthening exercises prescribed by your doctor. Safety measures are also important to protect the joint from further injury. Any time any of these exercises cause you to have increased pain or swelling, decrease what you are doing until you are comfortable again and then slowly increase them. If you have problems or questions, call your caregiver or physical therapist for advice.   Rehabilitation is important following a joint replacement. After just a few days of immobilization, the muscles of the leg can become weakened and shrink (atrophy).  These exercises are designed to build up the tone and strength of the thigh and leg muscles and to improve motion. Often times heat used for twenty to thirty minutes before working out will loosen up your tissues and help with improving the range of motion but do not use heat for the first two weeks following surgery (sometimes heat can increase post-operative swelling).   These exercises can be done on a training (exercise) mat, on the floor, on a table or on a bed. Use whatever works the best and is most comfortable for you.    Use music or television while you are exercising so that the exercises are a pleasant break in your day.  This will make your life better with the exercises acting as a break in your routine that you can look forward to.   Perform all exercises about fifteen times, three times per day or as directed.  You should exercise both the operative leg and the other leg as well.  Exercises include:   Quad Sets - Tighten up the muscle on the front of the thigh (Quad) and hold for 5-10 seconds.   Straight Leg Raises - With your knee straight (if you were given a brace, keep it on), lift the leg  to 60 degrees, hold for 3 seconds, and slowly lower the leg.  Perform this exercise against resistance later as your leg gets stronger.  Leg Slides: Lying on your back, slowly slide your foot toward your buttocks, bending your knee up off the floor (only go as far as is comfortable). Then slowly slide your foot back down until your leg is flat on the floor again.  Angel Wings: Lying on your back spread your legs to the side as far apart as you can without causing discomfort.  Hamstring Strength:  Lying on your back, push your heel against the floor with your leg straight by tightening up the muscles of your buttocks.  Repeat, but this time bend your knee to a comfortable angle, and push your heel against the floor.  You may put a pillow under the heel to make it more comfortable if necessary.   A rehabilitation program following joint replacement surgery can speed recovery and prevent re-injury in the future due to weakened muscles. Contact your doctor or a physical therapist for more information on knee rehabilitation.   CONSTIPATION:  Constipation is defined medically as fewer than three stools per week and severe constipation as less than one stool per week.  Even if you have a regular bowel pattern at home, your normal regimen is likely to be disrupted due to multiple reasons following surgery.  Combination of anesthesia, postoperative narcotics, change in appetite and fluid intake all can affect your bowels.   YOU MUST use at least one of the following options; they are listed in order of increasing strength to get the job done.  They are all available over the counter, and you may need to use some, POSSIBLY even all of these options:    Drink plenty of fluids (prune juice may be helpful) and high fiber foods Colace 100 mg by mouth twice a day  Senokot for constipation as directed and as needed Dulcolax (bisacodyl), take with full glass of water  Miralax (polyethylene glycol) once or twice a day  as needed.  If you have tried all these things and are unable to have a bowel movement in the first 3-4 days after surgery call either your surgeon or your primary doctor.    If you experience loose stools or diarrhea, hold the medications until you stool forms back up.  If your symptoms do not get better within 1 week or if they get worse, check with your doctor.  If you experience "the worst abdominal pain ever" or develop nausea or vomiting, please contact the office immediately for further recommendations for treatment.  ITCHING:  If you experience itching with your medications, try taking only a single pain pill, or even half a pain pill at a time.  You can also use Benadryl over the counter for itching or also to help with sleep.   TED HOSE STOCKINGS:  Use stockings on both legs until for  at least 2 weeks or as directed by physician office. They may be removed at night for sleeping.  MEDICATIONS:  See your medication summary on the "After Visit Summary" that nursing will review with you.  You may have some home medications which will be placed on hold until you complete the course of blood thinner medication.  It is important for you to complete the blood thinner medication as prescribed.  Blood clot prevention (DVT Prophylaxis): After surgery you are at an increased risk for a blood clot. you were prescribed a blood thinner, Aspirin 81mg , to be taken twice daily for a total of 4 weeks from surgery to help reduce your risk of getting a blood clot.  Signs of a pulmonary embolus (blood clot in the lungs) include sudden short of breath, feeling lightheaded or dizzy, chest pain with a deep breath, rapid pulse rapid breathing.  Signs of a blood clot in your arms or legs include new unexplained swelling and cramping, warm, red or darkened skin around the painful area.  Please call the office or 911 right away if these signs or symptoms develop.  PRECAUTIONS:   If you experience chest pain or shortness  of breath - call 911 immediately for transfer to the hospital emergency department.   If you develop a fever greater that 101 F, purulent drainage from wound, increased redness or drainage from wound, foul odor from the wound/dressing, or calf pain - CONTACT YOUR SURGEON.                                                   FOLLOW-UP APPOINTMENTS:  If you do not already have a post-op appointment, please call the office for an appointment to be seen by your surgeon.  Guidelines for how soon to be seen are listed in your "After Visit Summary", but are typically between 2-3 weeks after surgery.  If you have a specialized bandage, you may be told to follow up 1 week after surgery.  OTHER INSTRUCTIONS:  Knee Replacement:  Do not place pillow under knee, focus on keeping the knee straight while resting.  Place foam block, curve side up under heel at all times except when walking.  DO NOT modify, tear, cut, or change the foam block in any way.  POST-OPERATIVE OPIOID TAPER INSTRUCTIONS: It is important to wean off of your opioid medication as soon as possible. If you do not need pain medication after your surgery it is ok to stop day one. Opioids include: Codeine, Hydrocodone(Norco, Vicodin), Oxycodone(Percocet, oxycontin) and hydromorphone amongst others.  Long term and even short term use of opiods can cause: Increased pain response Dependence Constipation Depression Respiratory depression And more.  Withdrawal symptoms can include Flu like symptoms Nausea, vomiting And more Techniques to manage these symptoms Hydrate well Eat regular healthy meals Stay active Use relaxation techniques(deep breathing, meditating, yoga) Do Not substitute Alcohol to help with tapering If you have been on opioids for less than two weeks and do not have pain than it is ok to stop all together.  Plan to wean off of opioids This plan should start within one week post op of your joint replacement. Maintain the same  interval or time between taking each dose and first decrease the dose.  Cut the total daily intake of opioids by one tablet each day Next start to  increase the time between doses. The last dose that should be eliminated is the evening dose.   MAKE SURE YOU:  Understand these instructions.  Get help right away if you are not doing well or get worse.    Thank you for letting us be a part of your medical care team.  It is a privilege we respect greatly.  We hope these instructions will help you stay on track for a fast and full recovery!

## 2023-04-21 NOTE — Transfer of Care (Signed)
 Immediate Anesthesia Transfer of Care Note  Patient: Oren Section  Procedure(s) Performed: LEFT KNEE REVISION OF POLY COMPONENT (Left: Knee)  Patient Location: PACU  Anesthesia Type:MAC, Regional, and Spinal  Level of Consciousness: awake, alert , and oriented  Airway & Oxygen Therapy: Patient Spontanous Breathing  Post-op Assessment: Report given to RN and Post -op Vital signs reviewed and stable  Post vital signs: Reviewed and stable   Last Vitals:  Vitals Value Taken Time  BP 110/61   Temp    Pulse 71 04/21/23 1653  Resp 18   SpO2 96 % 04/21/23 1653  Vitals shown include unfiled device data.  Last Pain:  Vitals:   04/21/23 1440  TempSrc:   PainSc: 0-No pain      Patients Stated Pain Goal: 4 (04/21/23 1253)  Complications: No notable events documented.

## 2023-04-21 NOTE — Anesthesia Procedure Notes (Signed)
 Procedure Name: MAC Date/Time: 04/21/2023 2:51 PM  Performed by: Floydene Flock, CRNAPre-anesthesia Checklist: Emergency Drugs available, Patient identified, Suction available and Patient being monitored Patient Re-evaluated:Patient Re-evaluated prior to induction Oxygen Delivery Method: Simple face mask Preoxygenation: Pre-oxygenation with 100% oxygen Placement Confirmation: positive ETCO2

## 2023-04-21 NOTE — Anesthesia Procedure Notes (Signed)
 Anesthesia Regional Block: Adductor canal block   Pre-Anesthetic Checklist: , timeout performed,  Correct Patient, Correct Site, Correct Laterality,  Correct Procedure, Correct Position, site marked,  Risks and benefits discussed,  Surgical consent,  Pre-op evaluation,  At surgeon's request and post-op pain management  Laterality: Left  Prep: chloraprep       Needles:  Injection technique: Single-shot  Needle Type: Echogenic Stimulator Needle     Needle Length: 10cm  Needle Gauge: 21   Needle insertion depth: 7 cm   Additional Needles:   Procedures:,,,, ultrasound used (permanent image in chart),,    Narrative:  Start time: 04/21/2023 2:32 PM End time: 04/21/2023 2:37 PM Injection made incrementally with aspirations every 5 mL.  Performed by: Personally  Anesthesiologist: Mal Amabile, MD  Additional Notes: Timeout performed. Patient sedated. Relevant anatomy ID'd using [place]. Incremental 2-54ml injection of LA with frequent aspiration. Patient tolerated procedure well.

## 2023-04-21 NOTE — Interval H&P Note (Signed)
 The patient has been re-examined, and the chart reviewed, and there have been no interval changes to the documented history and physical.    Plan for L TKA revision for instability, liner exchange versus both component revision  The operative side was examined and the patient was confirmed to have sensation to DPN, SPN, TN intact, Motor EHL, ext, flex 5/5, and DP 2+, PT 2+, No significant edema.   The risks, benefits, and alternatives have been discussed at length with patient, and the patient is willing to proceed.  Left knee marked. Consent has been signed.

## 2023-04-21 NOTE — Op Note (Signed)
 DATE OF SURGERY:  04/21/2023 TIME: 4:16 PM  PATIENT NAME:  Allison Harris   AGE: 78 y.o.    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   OPERATIVE IMPLANTS:  Implant Name Type Inv. Item Serial No. Manufacturer Lot No. LRB No. Used Action  ATTUNE PSRP INSR SZ6 8 KNEE - [id number] Insert ATTUNE PSRP INSR SZ6 8 KNEE  [employer] [id number] Left 1 Implanted      PREOPERATIVE INDICATIONS:  Allison Harris is a 78 y.o. year old female who had undergone left total knee arthroplasty in April 2023 with Dr. Jerl Santos.  Postoperatively she never really did well.  She had significant pain and swelling disability with the knee replacement.  She regained good range of motion.  She notes occasional instability and buckling of the knee.  She had workup including aspiration which was negative for infection as well as a bone scan which did not show any obvious loosening components.  Radiographs also showed reassuring component position and fixation.  Clinically felt she had significant laxity with flexion anterior to posterior as well as laxity on the medial side in extension.  Ultimately discussed benefits of revision of her knee arthroplasty for instability including liner exchange versus both component revision.    The risks, benefits, and alternatives were discussed at length including but not limited to the risks of infection, bleeding, nerve injury, stiffness, blood clots, the need for revision surgery, cardiopulmonary complications, among others, and they were willing to proceed.  OPERATIVE FINDINGS AND UNIQUE ASPECTS OF THE CASE:    Significant laxity anterior to posterior 90 degrees  of flexion with about 1 cm of translation.  Also about 3 mm of opening medially in extension.  Significantly improved from upsized from left 5 to the 8 mm insert now about 4 to 5 mm of anterior posterior translation as well as 1 mm of medial laxity.  ESTIMATED BLOOD LOSS: 50cc  OPERATIVE DESCRIPTION:   Once adequate anesthesia was induced, preoperative antibiotics, 2 gm of ancef,1 gm of Tranexamic Acid, and 8 mg of Decadron administered, the patient was positioned supine with a left thigh tourniquet placed.  The left lower extremity was prepped and draped in sterile fashion.  A time-  out was performed identifying the patient, planned procedure, and the appropriate extremity.     The leg was  exsanguinated, tourniquet elevated to 250 mmHg.  A midline incision was  made utilizing the patient's old incision followed by median parapatellar arthrotomy.  Large knee effusion of clear serous joint fluid.  There is no significant synovitis or inflammation.  A medial release was performed, the infrapatellar fat pad was resected with care taken to protect the patellar tendon. The suprapatellar fat was removed to exposed the distal anterior femur.  2 soft tissue specimens were sent for aerobic and anaerobic culture.  Following initial  exposure, the knee was assessed for stability.  Notably there was laxity in extension with valgus stress.  There is also significant anterior posterior drawer where the knee could almost be completely dislocated anteriorly.  The old liner was removed.  The components were assessed and found to be well-fixed and stable.  There was a residual piece of medial meniscus which was resected.  A synovectomy  was performed around the patella to remove any scar tissue and adhesions.  A posterior capsular release was also performed using a Cobb with the knee in flexion.  We then turned our attention to trialing a new liner insert.  We trialed the 7, 8, and 10 mm insert.  The 8 mm insert had the  best ability which had about 1 mm of opening medially in extension at home about 4 to 5 mm of anterior posterior translation and flexion with no excessive subluxation or dislocation.  The knee was irrigated with sterile Betadine diluted in saline as well as pulse lavage normal saline.  The synovial lining was  then injected a dilute Exparel with 30cc of 0.25% marcaine with epinephrine.       I confirmed that I was satisfied with the range of motion and stability, and the final 8mm RP poly insert was chosen.  It was placed into the knee.         The tourniquet had been let down.  No significant hemostasis was required.  The medial parapatellar arthrotomy was then reapproximated using #1 Stratafix sutures with the knee  in flexion.  The remaining wound was closed with 0 stratafix, 2-0 Vicryl, and running 3-0 Nylon. The knee was cleaned, dried, dressed sterilely using Dermabond and Aquacel dressing.  The patient was then brought to recovery room in stable condition, tolerating the procedure  well. There were no complications.   Post op recs: WB: WBAT Abx: ancef Imaging: PACU xrays DVT prophylaxis: Aspirin 81mg  BID x4 weeks Follow up: 2 weeks after surgery for a wound check with Dr. Blanchie Dessert at [facility].  Address: [address], [place], [place] [address]  Office Phone: [phone]  Weber Cooks, MD Orthopaedic Surgery

## 2023-04-22 ENCOUNTER — Encounter (HOSPITAL_COMMUNITY): Payer: Self-pay | Admitting: Orthopedic Surgery

## 2023-04-22 LAB — BASIC METABOLIC PANEL
Anion gap: 8 (ref 5–15)
BUN: 10 mg/dL (ref 8–23)
CO2: 22 mmol/L (ref 22–32)
Calcium: 8.8 mg/dL — ABNORMAL LOW (ref 8.9–10.3)
Chloride: 103 mmol/L (ref 98–111)
Creatinine, Ser: 0.46 mg/dL (ref 0.44–1.00)
GFR, Estimated: >60 mL/min (ref >60)
Glucose, Bld: 132 mg/dL — ABNORMAL HIGH (ref 70–99)
Potassium: 3.9 mmol/L (ref 3.5–5.1)
Sodium: 133 mmol/L — ABNORMAL LOW (ref 135–145)

## 2023-04-22 LAB — CBC
HCT: 37.6 % (ref 36.0–46.0)
Hemoglobin: 12.1 g/dL (ref 12.0–15.0)
MCH: 29.2 pg (ref 26.0–34.0)
MCHC: 32.2 g/dL (ref 30.0–36.0)
MCV: 90.8 fL (ref 80.0–100.0)
Platelets: 227 10*3/uL (ref 150–400)
RBC: 4.14 MIL/uL (ref 3.87–5.11)
RDW: 14.1 % (ref 11.5–15.5)
WBC: 5.7 10*3/uL (ref 4.0–10.5)
nRBC: 0 % (ref 0.0–0.2)

## 2023-04-22 MED ORDER — TRAMADOL HCL 50 MG PO TABS: 50.0000 mg | ORAL_TABLET | Freq: Four times a day (QID) | ORAL | Status: DC | PRN

## 2023-04-22 NOTE — Progress Notes (Signed)
 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  Pt eager for dc home this date.    If plan is discharge home, recommend the following: A little help with walking and/or transfers;A little help with bathing/dressing/bathroom;Assistance with cooking/housework;Assist for transportation;Help with stairs or ramp for entrance   Can travel by private vehicle        Equipment Recommendations  None recommended by PT    Recommendations for Other Services       Precautions / Restrictions Precautions Precautions: Knee;Fall Restrictions Weight Bearing Restrictions Per Provider Order: Yes LLE Weight Bearing Per Provider Order: Weight bearing as tolerated     Mobility  Bed Mobility Overal bed mobility: Needs Assistance Bed Mobility: Supine to Sit     Supine to sit: Contact guard     General bed mobility comments: Up in chair and returns to same    Transfers Overall transfer level: Needs assistance Equipment used: Rolling walker (2 wheels) Transfers: Sit to/from Stand Sit to Stand: Min assist, Contact guard assist           General transfer comment: cues for LE management and use of UEs to self assist    Ambulation/Gait Ambulation/Gait assistance: Contact guard assist, Supervision Gait Distance (Feet): 100 Feet Assistive device: Rolling walker (2 wheels) Gait Pattern/deviations: Step-to pattern, Step-through pattern, Shuffle, Trunk flexed Gait velocity: decr     General Gait Details: cues for sequence, posture and position from RW   Stairs Stairs: Yes Stairs assistance: Min assist Stair Management: One rail Right, Step to pattern,  Forwards, With cane Number of Stairs: 5 General stair comments: cues for sequence and foot/cane placement; dtr present   Wheelchair Mobility     Tilt Bed    Modified Rankin (Stroke Patients Only)       Balance Overall balance assessment: Mild deficits observed, not formally tested                                          Communication Communication Communication: No apparent difficulties  Cognition Arousal: Alert Behavior During Therapy: WFL for tasks assessed/performed   PT - Cognitive impairments: No apparent impairments                         Following commands: Intact      Cueing Cueing Techniques: Verbal cues  Exercises Total Joint Exercises Ankle Circles/Pumps: AROM, Both, 15 reps, Supine Quad Sets: AROM, Both, 10 reps, Supine Heel Slides: AAROM, Left, 15 reps, Supine Straight Leg Raises: AAROM, AROM, Left, 15 reps, Supine Long Arc Quad: Left, AAROM, AROM, 10 reps, Seated    General Comments        Pertinent Vitals/Pain Pain Assessment Pain Assessment: 0-10 Pain Score: 6  Pain Location: L knee Pain Descriptors / Indicators: Aching, Sore Pain Intervention(s): Limited activity within patient's tolerance, Monitored during session, Premedicated before session, Ice applied    Home Living Family/patient expects to be discharged to:: Private residence Living Arrangements: Alone Available Help at Discharge: Family;Available 24 hours/day Type of Home: House Home Access:  Stairs to enter Entrance Stairs-Rails: Right Entrance Stairs-Number of Steps: 3   Home Layout: One level Home Equipment: Cane - single Librarian, academic (2 wheels)      Prior Function            PT Goals (current goals can now be found in the care plan section) Acute Rehab PT Goals Patient Stated Goal: Regain IND PT Goal Formulation: With patient Time For Goal Achievement: 04/29/23 Potential to Achieve Goals: Good Progress towards PT goals:  Progressing toward goals    Frequency    7X/week      PT Plan      Co-evaluation              AM-PAC PT "6 Clicks" Mobility   Outcome Measure  Help needed turning from your back to your side while in a flat bed without using bedrails?: A Little Help needed moving from lying on your back to sitting on the side of a flat bed without using bedrails?: A Little Help needed moving to and from a bed to a chair (including a wheelchair)?: A Little Help needed standing up from a chair using your arms (e.g., wheelchair or bedside chair)?: A Little Help needed to walk in hospital room?: A Little Help needed climbing 3-5 steps with a railing? : A Little 6 Click Score: 18    End of Session Equipment Utilized During Treatment: Gait belt Activity Tolerance: Patient tolerated treatment well Patient left: in chair;with call bell/phone within reach;with family/visitor present Nurse Communication: Mobility status PT Visit Diagnosis: Difficulty in walking, not elsewhere classified (R26.2)     Time: 1914-7829 PT Time Calculation (min) (ACUTE ONLY): 31 min  Charges:    $Gait Training: 8-22 mins $Therapeutic Exercise: 8-22 mins $Therapeutic Activity: 8-22 mins PT General Charges $$ ACUTE PT VISIT: 1 Visit                     Mauro Kaufmann PT Acute Rehabilitation Services Pager [phone] Office [phone]    Jaylanni Eltringham 04/22/2023, 11:59 AM

## 2023-04-22 NOTE — Care Management Important Message (Signed)
 Important Message  Patient Details IM Letter given to the Patient. Name: Allison Harris MRN: [id number] Date of Birth: 1945-07-18   Important Message Given:  Yes - Medicare IM     Caren Macadam 04/22/2023, 8:29 AM

## 2023-04-22 NOTE — Progress Notes (Signed)
 Switched wound vac over to portable and demonstrated use. Patient and family have no further questions.

## 2023-04-22 NOTE — Progress Notes (Signed)
     Subjective:  Patient reports pain as mild.  No issues overnight.  Has not yet mobilized with physical therapy.  Denies distal numbness and tingling.  Discussed intraoperative findings with the patient.  Objective:   VITALS:   Vitals:   04/21/23 1915 04/21/23 2202 04/22/23 0155 04/22/23 0656  BP:  117/65 111/70 118/73  Pulse:  66 62 (!) 52  Resp:  15 15 18   Temp:  97.8 F (36.6 C) 98 F (36.7 C) 97.9 F (36.6 C)  TempSrc:  Oral Oral   SpO2:  92% 93% 99%  Weight: 70.7 kg     Height: 5\' 6"  (1.676 m)       Sensation intact distally Intact pulses distally Dorsiflexion/Plantar flexion intact Incision: dressing C/D/I Compartment soft Wound VAC holding suction 125 mm continuous  Lab Results  Component Value Date   WBC 5.7 04/22/2023   HGB 12.1 04/22/2023   HCT 37.6 04/22/2023   MCV 90.8 04/22/2023   PLT 227 04/22/2023   BMET    Component Value Date/Time   NA 133 (L) 04/22/2023 0327   K 3.9 04/22/2023 0327   CL 103 04/22/2023 0327   CO2 22 04/22/2023 0327   GLUCOSE 132 (H) 04/22/2023 0327   BUN 10 04/22/2023 0327   CREATININE 0.46 04/22/2023 0327   CALCIUM 8.8 (L) 04/22/2023 0327   GFRNONAA >60 04/22/2023 0327      Xray: TKA components in good position no adverse features  Assessment/Plan: 1 Day Post-Op   Principal Problem:   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 Blanchie Dessert at [facility].  Address: [address], [place], [place] [address]  Office Phone: [phone]     Joen Laura 04/22/2023, 7:19 AM   Weber Cooks, MD  Contact information:   [phone] 7am-5pm epic message Dr. Blanchie Dessert, or call office for patient follow up: [phone] After hours and holidays please check [email or web address] for group  call information for [facility]

## 2023-04-22 NOTE — TOC Transition Note (Signed)
 Transition of Care [facility]) - Discharge Note   Patient Details  Name: Allison Harris MRN: [id number] Date of Birth: 23-Aug-1945  [facility] CM/SW Contact:  Howell Rucks, RN Phone Number: 04/22/2023, 9:50 AM   Clinical Narrative: Met with pt at bedside to review dc therapy and home DME needs. Confirmed with pt  HH PT arranged with [facility], pt confirmed she has a RW, no home DME needs. PT to dc home with Pre[employer] wound vac. No TOC needs identified.     Final next level of care: Home w Home Health Services Barriers to Discharge: No Barriers Identified   Patient Goals and CMS Choice Patient states their goals for this hospitalization and ongoing recovery are:: return home          Discharge Placement                       Discharge Plan and Services Additional resources added to the After Visit Summary for                                       Social Drivers of Health (SDOH) Interventions SDOH Screenings   Food Insecurity: No Food Insecurity (04/21/2023)  Housing: Low Risk  (04/21/2023)  Transportation Needs: No Transportation Needs (04/21/2023)  Utilities: Not At Risk (04/21/2023)  Social Connections: Unknown (04/21/2023)  Tobacco Use: Medium Risk (04/21/2023)     Readmission Risk Interventions    04/22/2023    9:49 AM  Readmission Risk Prevention Plan  Post Dischage Appt Complete  Medication Screening Complete  Transportation Screening Complete

## 2023-04-22 NOTE — Evaluation (Signed)
 Physical Therapy Evaluation Patient Details Name: Allison Harris MRN: [id number] DOB: Mar 20, 1945 Today's Date: 04/22/2023  History of Present Illness  Pt s/p L TKR revision and with hx of L TKR ~2 yrs ago  Clinical Impression  Pt s/p L TKR revision and presents with decreased L LE strength/ROM and post op pain limiting functional mobility.  Pt should progress to dc home with family assist.        If plan is discharge home, recommend the following: A little help with walking and/or transfers;A little help with bathing/dressing/bathroom;Assistance with cooking/housework;Assist for transportation;Help with stairs or ramp for entrance   Can travel by private vehicle        Equipment Recommendations None recommended by PT  Recommendations for Other Services       Functional Status Assessment Patient has had a recent decline in their functional status and demonstrates the ability to make significant improvements in function in a reasonable and predictable amount of time.     Precautions / Restrictions Precautions Precautions: Knee;Fall Restrictions Weight Bearing Restrictions Per Provider Order: Yes LLE Weight Bearing Per Provider Order: Weight bearing as tolerated      Mobility  Bed Mobility Overal bed mobility: Needs Assistance Bed Mobility: Supine to Sit     Supine to sit: Contact guard     General bed mobility comments: for safety    Transfers Overall transfer level: Needs assistance Equipment used: Rolling walker (2 wheels) Transfers: Sit to/from Stand Sit to Stand: Min assist, Contact guard assist           General transfer comment: cues for LE management and use of UEs to self assist    Ambulation/Gait Ambulation/Gait assistance: Min assist, Contact guard assist Gait Distance (Feet): 100 Feet Assistive device: Rolling walker (2 wheels) Gait Pattern/deviations: Step-to pattern, Step-through pattern, Shuffle, Trunk flexed Gait velocity: decr     General  Gait Details: cues for sequence, posture and position from [employer]            Wheelchair Mobility     Tilt Bed    Modified Rankin (Stroke Patients Only)       Balance Overall balance assessment: Mild deficits observed, not formally tested                                           Pertinent Vitals/Pain Pain Assessment Pain Assessment: 0-10 Pain Score: 6  Pain Location: L knee Pain Descriptors / Indicators: Aching, Sore Pain Intervention(s): Limited activity within patient's tolerance, Monitored during session, Premedicated before session    Home Living Family/patient expects to be discharged to:: Private residence Living Arrangements: Alone Available Help at Discharge: Family;Available 24 hours/day Type of Home: House Home Access: Stairs to enter Entrance Stairs-Rails: Right Entrance Stairs-Number of Steps: 3   Home Layout: One level Home Equipment: Cane - single Librarian, academic (2 wheels)      Prior Function Prior Level of Function : Independent/Modified Independent                     Extremity/Trunk Assessment   Upper Extremity Assessment Upper Extremity Assessment: Overall WFL for tasks assessed    Lower Extremity Assessment Lower Extremity Assessment: LLE deficits/detail LLE Deficits / Details: 3/5 quads with AAROM at knee -5 - 90    Cervical / Trunk Assessment Cervical / Trunk Assessment: Normal  Communication   Communication  Communication: No apparent difficulties    Cognition Arousal: Alert Behavior During Therapy: WFL for tasks assessed/performed   PT - Cognitive impairments: No apparent impairments                         Following commands: Intact       Cueing Cueing Techniques: Verbal cues     General Comments      Exercises Total Joint Exercises Ankle Circles/Pumps: AROM, Both, 15 reps, Supine Quad Sets: AROM, Both, 10 reps, Supine Heel Slides: AAROM, Left, 15 reps,  Supine Straight Leg Raises: AAROM, AROM, Left, 15 reps, Supine Long Arc Quad: Left, AAROM, AROM, 10 reps, Seated   Assessment/Plan    PT Assessment Patient needs continued PT services  PT Problem List Decreased strength;Decreased range of motion;Decreased activity tolerance;Decreased balance;Decreased mobility;Decreased knowledge of use of DME;Pain       PT Treatment Interventions DME instruction;Gait training;Stair training;Functional mobility training;Therapeutic activities;Therapeutic exercise;Patient/family education    PT Goals (Current goals can be found in the Care Plan section)  Acute Rehab PT Goals Patient Stated Goal: Regain IND PT Goal Formulation: With patient Time For Goal Achievement: 04/29/23 Potential to Achieve Goals: Good    Frequency 7X/week     Co-evaluation               AM-PAC PT "6 Clicks" Mobility  Outcome Measure Help needed turning from your back to your side while in a flat bed without using bedrails?: A Little Help needed moving from lying on your back to sitting on the side of a flat bed without using bedrails?: A Little Help needed moving to and from a bed to a chair (including a wheelchair)?: A Little Help needed standing up from a chair using your arms (e.g., wheelchair or bedside chair)?: A Little Help needed to walk in hospital room?: A Little Help needed climbing 3-5 steps with a railing? : A Little 6 Click Score: 18    End of Session Equipment Utilized During Treatment: Gait belt Activity Tolerance: Patient tolerated treatment well Patient left: Other (comment) (bathroom) Nurse Communication: Mobility status PT Visit Diagnosis: Difficulty in walking, not elsewhere classified (R26.2)    Time: 4132-4401 PT Time Calculation (min) (ACUTE ONLY): 26 min   Charges:   PT Evaluation $PT Eval Low Complexity: 1 Low PT Treatments $Therapeutic Exercise: 8-22 mins PT General Charges $$ ACUTE PT VISIT: 1 Visit         Mauro Kaufmann PT Acute Rehabilitation Services Pager [phone] Office [phone]   Uchechi Denison 04/22/2023, 11:51 AM

## 2023-04-22 NOTE — Discharge Summary (Signed)
 Physician Discharge Summary  Patient ID: Allison Harris MRN: [id number] DOB/AGE: 1945/11/03 78 y.o.  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 female who was admitted 04/21/2023 with a diagnosis of Failed total knee replacement (HCC) and went to the operating room on 04/21/2023 and underwent the above named procedures.    She was given perioperative antibiotics:  Anti-infectives (From admission, onward)    Start     Dose/Rate Route Frequency Ordered Stop   04/21/23 2100  ceFAZolin (ANCEF) IVPB 2g/100 mL premix        2 g 200 mL/hr over 30 Minutes Intravenous Every 6 hours 04/21/23 1751 04/22/23 0250   04/21/23 1200  ceFAZolin (ANCEF) IVPB 2g/100 mL premix        2 g 200 mL/hr over 30 Minutes Intravenous On call to O.R. 04/21/23 1150 04/21/23 1515     .  She was given sequential compression devices, early ambulation, and aspirin for DVT prophylaxis.  She benefited maximally from the hospital stay and there were no complications.    Recent vital signs:  Vitals:   04/22/23 0155 04/22/23 0656  BP: 111/70 118/73  Pulse: 62 (!) 52  Resp: 15 18  Temp: 98 F (36.7 C) 97.9 F (36.6 C)  SpO2: 93% 99%    Recent laboratory studies:  Lab Results  Component Value Date   HGB 12.1 04/22/2023   HGB 13.8 04/08/2023   Lab Results  Component Value Date   WBC 5.7 04/22/2023   PLT 227 04/22/2023   No results found for: "INR" Lab Results  Component Value Date   NA 133 (L) 04/22/2023   K 3.9 04/22/2023   CL 103 04/22/2023   CO2 22 04/22/2023   BUN 10 04/22/2023    CREATININE 0.46 04/22/2023   GLUCOSE 132 (H) 04/22/2023    Discharge Medications:   Allergies as of 04/22/2023   No Known Allergies      Medication List     TAKE these medications    acetaminophen 500 MG tablet Commonly known as: TYLENOL Take 2 tablets (1,000 mg total) by mouth every 8 (eight) hours as needed.   aspirin EC 81 MG tablet Take 1 tablet (81 mg total) by mouth 2 (two) times daily for 28 days. Swallow whole.   celecoxib 100 MG capsule Commonly known as: CeleBREX Take 1 capsule (100 mg total) by mouth 2 (two) times daily for 14 days.   cyanocobalamin 1000 MCG tablet Commonly known as: VITAMIN B12 Take 1,000 mcg by mouth daily.   lisinopril-hydrochlorothiazide 10-12.5 MG tablet Commonly known as: ZESTORETIC Take 1 tablet by mouth daily.   methocarbamol 500 MG tablet Commonly known as: ROBAXIN Take 1 tablet (500 mg total) by mouth every 8 (eight) hours as needed for up to 10 days for muscle spasms.   omeprazole 40 MG capsule Commonly known as: PRILOSEC Take 1 capsule (40 mg total) by mouth daily for 21 days.   ondansetron 4 MG tablet Commonly known as: Zofran Take 1 tablet (4 mg total) by mouth every 8 (eight) hours as needed for up  to 14 days for nausea or vomiting.   oxyCODONE 5 MG immediate release tablet Commonly known as: Roxicodone Take 1 tablet (5 mg total) by mouth every 4 (four) hours as needed for up to 7 days for severe pain (pain score 7-10) or moderate pain (pain score 4-6).   polyethylene glycol 17 g packet Commonly known as: MiraLax Take 17 g by mouth daily.   sertraline 50 MG tablet Commonly known as: ZOLOFT Take 50 mg by mouth daily.   sodium fluoride-calcium carbonate 8.3-364 MG Caps capsule Commonly known as: FLORICAL Take 1 capsule by mouth daily.   Synthroid 25 MCG tablet Generic drug: levothyroxine Take 25 mcg by mouth every morning.   vitamin E 180 MG (400 UNITS) capsule Take 400 Units by mouth daily.         Diagnostic Studies: DG Knee Left Port Result Date: 04/21/2023 CLINICAL DATA:  Postop. EXAM: PORTABLE LEFT KNEE - 1-2 VIEW COMPARISON:  None Available. FINDINGS: Left knee arthroplasty in expected alignment. No periprosthetic lucency or fracture. There has been patellar resurfacing. Recent postsurgical change includes air and edema in the soft tissues and joint space. Presumed wound VAC anteriorly. IMPRESSION: Left knee arthroplasty without immediate postoperative complication. Electronically Signed   By: Narda Rutherford M.D.   On: 04/21/2023 17:19    Disposition: Discharge disposition: 01-Home or Self Care       Discharge Instructions     Call MD / Call 911   Complete by: As directed    If you experience chest pain or shortness of breath, CALL 911 and be transported to the hospital emergency room.  If you develope a fever above 101 F, pus (white drainage) or increased drainage or redness at the wound, or calf pain, call your surgeon's office.   Constipation Prevention   Complete by: As directed    Drink plenty of fluids.  Prune juice may be helpful.  You may use a stool softener, such as Colace (over the counter) 100 mg twice a day.  Use MiraLax (over the counter) for constipation as needed.   Diet - low sodium heart healthy   Complete by: As directed    Do not put a pillow under the knee. Place it under the heel.   Complete by: As directed    Driving restrictions   Complete by: As directed    No driving for 2-4 weeks   Increase activity slowly as tolerated   Complete by: As directed    Post-operative opioid taper instructions:   Complete by: As directed    POST-OPERATIVE OPIOID TAPER INSTRUCTIONS: It is important to wean off of your opioid medication as soon as possible. If you do not need pain medication after your surgery it is ok to stop day one. Opioids include: Codeine, Hydrocodone(Norco, Vicodin), Oxycodone(Percocet, oxycontin) and hydromorphone amongst others.  Long  term and even short term use of opiods can cause: Increased pain response Dependence Constipation Depression Respiratory depression And more.  Withdrawal symptoms can include Flu like symptoms Nausea, vomiting And more Techniques to manage these symptoms Hydrate well Eat regular healthy meals Stay active Use relaxation techniques(deep breathing, meditating, yoga) Do Not substitute Alcohol to help with tapering If you have been on opioids for less than two weeks and do not have pain than it is ok to stop all together.  Plan to wean off of opioids This plan should start within one week post op of your joint replacement. Maintain the same interval or time between taking each  dose and first decrease the dose.  Cut the total daily intake of opioids by one tablet each day Next start to increase the time between doses. The last dose that should be eliminated is the evening dose.      TED hose   Complete by: As directed    Use stockings (TED hose) for 2 weeks on both leg(s).  Then for 2 more weeks on the surgical leg.  You may remove them at night for sleeping.        Follow-up Information     Joen Laura, MD Follow up in 1 week(s).   Specialty: Orthopedic Surgery Contact information: [address] [place] [address] [phone]                    Discharge Instructions      INSTRUCTIONS AFTER JOINT REPLACEMENT   Remove items at home which could result in a fall. This includes throw rugs or furniture in walking pathways ICE to the affected joint every three hours while awake for 30 minutes at a time, for at least the first 3-5 days, and then as needed for pain and swelling.  Continue to use ice for pain and swelling. You may notice swelling that will progress down to the foot and ankle.  This is normal after surgery.  Elevate your leg when you are not up walking on it.   Continue to use the breathing machine you got in the hospital  (incentive spirometer) which will help keep your temperature down.  It is common for your temperature to cycle up and down following surgery, especially at night when you are not up moving around and exerting yourself.  The breathing machine keeps your lungs expanded and your temperature down.  DIET:  As you were doing prior to hospitalization, we recommend a well-balanced diet.  DRESSING / WOUND CARE / SHOWERING:  Keep the surgical dressing until follow up.  Keep the dressing dry when you shower, NO BATHS AT THIS TIME.  Please note: IF THE DRESSING FALLS OFF or the wound gets wet inside, call the office and change the dressing with sterile gauze.  Please use good hand washing techniques before changing the dressing.  Do not use any lotions or creams on the incision until instructed by your surgeon.    ACTIVITY  Increase activity slowly as tolerated, but follow the weight bearing instructions below.   No driving for 6 weeks or until further direction given by your physician.  You cannot drive while taking narcotics.  No lifting or carrying greater than 10 lbs. until further directed by your surgeon. Avoid periods of inactivity such as sitting longer than an hour when not asleep. This helps prevent blood clots.  You may return to work once you are authorized by your doctor.   WEIGHT BEARING: Weight bearing as tolerated with assist device (walker, cane, etc) as directed, use it as long as suggested by your surgeon or therapist, typically at least 4-6 weeks.  EXERCISES  Results after joint replacement surgery are often greatly improved when you follow the exercise, range of motion and muscle strengthening exercises prescribed by your doctor. Safety measures are also important to protect the joint from further injury. Any time any of these exercises cause you to have increased pain or swelling, decrease what you are doing until you are comfortable again and then slowly increase them. If you have  problems or questions, call your caregiver or physical therapist  for advice.   Rehabilitation is important following a joint replacement. After just a few days of immobilization, the muscles of the leg can become weakened and shrink (atrophy).  These exercises are designed to build up the tone and strength of the thigh and leg muscles and to improve motion. Often times heat used for twenty to thirty minutes before working out will loosen up your tissues and help with improving the range of motion but do not use heat for the first two weeks following surgery (sometimes heat can increase post-operative swelling).   These exercises can be done on a training (exercise) mat, on the floor, on a table or on a bed. Use whatever works the best and is most comfortable for you.    Use music or television while you are exercising so that the exercises are a pleasant break in your day. This will make your life better with the exercises acting as a break in your routine that you can look forward to.   Perform all exercises about fifteen times, three times per day or as directed.  You should exercise both the operative leg and the other leg as well.  Exercises include:   Quad Sets - Tighten up the muscle on the front of the thigh (Quad) and hold for 5-10 seconds.   Straight Leg Raises - With your knee straight (if you were given a brace, keep it on), lift the leg to 60 degrees, hold for 3 seconds, and slowly lower the leg.  Perform this exercise against resistance later as your leg gets stronger.  Leg Slides: Lying on your back, slowly slide your foot toward your buttocks, bending your knee up off the floor (only go as far as is comfortable). Then slowly slide your foot back down until your leg is flat on the floor again.  Angel Wings: Lying on your back spread your legs to the side as far apart as you can without causing discomfort.  Hamstring Strength:  Lying on your back, push your heel against the floor with your  leg straight by tightening up the muscles of your buttocks.  Repeat, but this time bend your knee to a comfortable angle, and push your heel against the floor.  You may put a pillow under the heel to make it more comfortable if necessary.   A rehabilitation program following joint replacement surgery can speed recovery and prevent re-injury in the future due to weakened muscles. Contact your doctor or a physical therapist for more information on knee rehabilitation.   CONSTIPATION:  Constipation is defined medically as fewer than three stools per week and severe constipation as less than one stool per week.  Even if you have a regular bowel pattern at home, your normal regimen is likely to be disrupted due to multiple reasons following surgery.  Combination of anesthesia, postoperative narcotics, change in appetite and fluid intake all can affect your bowels.   YOU MUST use at least one of the following options; they are listed in order of increasing strength to get the job done.  They are all available over the counter, and you may need to use some, POSSIBLY even all of these options:    Drink plenty of fluids (prune juice may be helpful) and high fiber foods Colace 100 mg by mouth twice a day  Senokot for constipation as directed and as needed Dulcolax (bisacodyl), take with full glass of water  Miralax (polyethylene glycol) once or twice a day as needed.  If  you have tried all these things and are unable to have a bowel movement in the first 3-4 days after surgery call either your surgeon or your primary doctor.    If you experience loose stools or diarrhea, hold the medications until you stool forms back up.  If your symptoms do not get better within 1 week or if they get worse, check with your doctor.  If you experience "the worst abdominal pain ever" or develop nausea or vomiting, please contact the office immediately for further recommendations for treatment.  ITCHING:  If you experience  itching with your medications, try taking only a single pain pill, or even half a pain pill at a time.  You can also use Benadryl over the counter for itching or also to help with sleep.   TED HOSE STOCKINGS:  Use stockings on both legs until for at least 2 weeks or as directed by physician office. They may be removed at night for sleeping.  MEDICATIONS:  See your medication summary on the "After Visit Summary" that nursing will review with you.  You may have some home medications which will be placed on hold until you complete the course of blood thinner medication.  It is important for you to complete the blood thinner medication as prescribed.  Blood clot prevention (DVT Prophylaxis): After surgery you are at an increased risk for a blood clot. you were prescribed a blood thinner, Aspirin 81mg , to be taken twice daily for a total of 4 weeks from surgery to help reduce your risk of getting a blood clot.  Signs of a pulmonary embolus (blood clot in the lungs) include sudden short of breath, feeling lightheaded or dizzy, chest pain with a deep breath, rapid pulse rapid breathing.  Signs of a blood clot in your arms or legs include new unexplained swelling and cramping, warm, red or darkened skin around the painful area.  Please call the office or 911 right away if these signs or symptoms develop.  PRECAUTIONS:   If you experience chest pain or shortness of breath - call 911 immediately for transfer to the hospital emergency department.   If you develop a fever greater that 101 F, purulent drainage from wound, increased redness or drainage from wound, foul odor from the wound/dressing, or calf pain - CONTACT YOUR SURGEON.                                                   FOLLOW-UP APPOINTMENTS:  If you do not already have a post-op appointment, please call the office for an appointment to be seen by your surgeon.  Guidelines for how soon to be seen are listed in your "After Visit Summary", but are  typically between 2-3 weeks after surgery.  If you have a specialized bandage, you may be told to follow up 1 week after surgery.  OTHER INSTRUCTIONS:  Knee Replacement:  Do not place pillow under knee, focus on keeping the knee straight while resting.  Place foam block, curve side up under heel at all times except when walking.  DO NOT modify, tear, cut, or change the foam block in any way.  POST-OPERATIVE OPIOID TAPER INSTRUCTIONS: It is important to wean off of your opioid medication as soon as possible. If you do not need pain medication after your surgery it is ok to stop day  one. Opioids include: Codeine, Hydrocodone(Norco, Vicodin), Oxycodone(Percocet, oxycontin) and hydromorphone amongst others.  Long term and even short term use of opiods can cause: Increased pain response Dependence Constipation Depression Respiratory depression And more.  Withdrawal symptoms can include Flu like symptoms Nausea, vomiting And more Techniques to manage these symptoms Hydrate well Eat regular healthy meals Stay active Use relaxation techniques(deep breathing, meditating, yoga) Do Not substitute Alcohol to help with tapering If you have been on opioids for less than two weeks and do not have pain than it is ok to stop all together.  Plan to wean off of opioids This plan should start within one week post op of your joint replacement. Maintain the same interval or time between taking each dose and first decrease the dose.  Cut the total daily intake of opioids by one tablet each day Next start to increase the time between doses. The last dose that should be eliminated is the evening dose.   MAKE SURE YOU:  Understand these instructions.  Get help right away if you are not doing well or get worse.    Thank you for letting us be a part of your medical care team.  It is a privilege we respect greatly.  We hope these instructions will help you stay on track for a fast and full recovery!             Signed: Ourania Hamler A Claressa Hughley 04/22/2023, 7:21 AM

## 2023-04-23 DIAGNOSIS — T84093D Other mechanical complication of internal left knee prosthesis, subsequent encounter: Secondary | ICD-10-CM | POA: Diagnosis not present

## 2023-04-23 DIAGNOSIS — Z87891 Personal history of nicotine dependence: Secondary | ICD-10-CM | POA: Diagnosis not present

## 2023-04-23 DIAGNOSIS — I10 Essential (primary) hypertension: Secondary | ICD-10-CM | POA: Diagnosis not present

## 2023-04-23 DIAGNOSIS — Z791 Long term (current) use of non-steroidal anti-inflammatories (NSAID): Secondary | ICD-10-CM | POA: Diagnosis not present

## 2023-04-23 DIAGNOSIS — E039 Hypothyroidism, unspecified: Secondary | ICD-10-CM | POA: Diagnosis not present

## 2023-04-23 DIAGNOSIS — Z7982 Long term (current) use of aspirin: Secondary | ICD-10-CM | POA: Diagnosis not present

## 2023-04-23 DIAGNOSIS — R32 Unspecified urinary incontinence: Secondary | ICD-10-CM | POA: Diagnosis not present

## 2023-04-23 DIAGNOSIS — T84023D Instability of internal left knee prosthesis, subsequent encounter: Secondary | ICD-10-CM | POA: Diagnosis not present

## 2023-04-23 DIAGNOSIS — K5900 Constipation, unspecified: Secondary | ICD-10-CM | POA: Diagnosis not present

## 2023-04-23 DIAGNOSIS — Z9841 Cataract extraction status, right eye: Secondary | ICD-10-CM | POA: Diagnosis not present

## 2023-04-23 DIAGNOSIS — Z9842 Cataract extraction status, left eye: Secondary | ICD-10-CM | POA: Diagnosis not present

## 2023-04-26 LAB — AEROBIC/ANAEROBIC CULTURE W GRAM STAIN (SURGICAL/DEEP WOUND)
Culture: NO GROWTH
Culture: NO GROWTH
Gram Stain: NONE SEEN
Gram Stain: NONE SEEN

## 2023-04-27 DIAGNOSIS — T84023A Instability of internal left knee prosthesis, initial encounter: Secondary | ICD-10-CM | POA: Diagnosis not present

## 2023-04-28 DIAGNOSIS — Z791 Long term (current) use of non-steroidal anti-inflammatories (NSAID): Secondary | ICD-10-CM | POA: Diagnosis not present

## 2023-04-28 DIAGNOSIS — Z7982 Long term (current) use of aspirin: Secondary | ICD-10-CM | POA: Diagnosis not present

## 2023-04-28 DIAGNOSIS — R32 Unspecified urinary incontinence: Secondary | ICD-10-CM | POA: Diagnosis not present

## 2023-04-28 DIAGNOSIS — Z87891 Personal history of nicotine dependence: Secondary | ICD-10-CM | POA: Diagnosis not present

## 2023-04-28 DIAGNOSIS — T84093D Other mechanical complication of internal left knee prosthesis, subsequent encounter: Secondary | ICD-10-CM | POA: Diagnosis not present

## 2023-04-28 DIAGNOSIS — Z9841 Cataract extraction status, right eye: Secondary | ICD-10-CM | POA: Diagnosis not present

## 2023-04-28 DIAGNOSIS — I10 Essential (primary) hypertension: Secondary | ICD-10-CM | POA: Diagnosis not present

## 2023-04-28 DIAGNOSIS — T84023D Instability of internal left knee prosthesis, subsequent encounter: Secondary | ICD-10-CM | POA: Diagnosis not present

## 2023-04-28 DIAGNOSIS — K5900 Constipation, unspecified: Secondary | ICD-10-CM | POA: Diagnosis not present

## 2023-04-28 DIAGNOSIS — Z9842 Cataract extraction status, left eye: Secondary | ICD-10-CM | POA: Diagnosis not present

## 2023-04-28 DIAGNOSIS — E039 Hypothyroidism, unspecified: Secondary | ICD-10-CM | POA: Diagnosis not present

## 2023-04-30 DIAGNOSIS — R32 Unspecified urinary incontinence: Secondary | ICD-10-CM | POA: Diagnosis not present

## 2023-04-30 DIAGNOSIS — Z791 Long term (current) use of non-steroidal anti-inflammatories (NSAID): Secondary | ICD-10-CM | POA: Diagnosis not present

## 2023-04-30 DIAGNOSIS — Z9841 Cataract extraction status, right eye: Secondary | ICD-10-CM | POA: Diagnosis not present

## 2023-04-30 DIAGNOSIS — K5900 Constipation, unspecified: Secondary | ICD-10-CM | POA: Diagnosis not present

## 2023-04-30 DIAGNOSIS — Z9842 Cataract extraction status, left eye: Secondary | ICD-10-CM | POA: Diagnosis not present

## 2023-04-30 DIAGNOSIS — T84023D Instability of internal left knee prosthesis, subsequent encounter: Secondary | ICD-10-CM | POA: Diagnosis not present

## 2023-04-30 DIAGNOSIS — E039 Hypothyroidism, unspecified: Secondary | ICD-10-CM | POA: Diagnosis not present

## 2023-04-30 DIAGNOSIS — Z7982 Long term (current) use of aspirin: Secondary | ICD-10-CM | POA: Diagnosis not present

## 2023-04-30 DIAGNOSIS — Z87891 Personal history of nicotine dependence: Secondary | ICD-10-CM | POA: Diagnosis not present

## 2023-04-30 DIAGNOSIS — I10 Essential (primary) hypertension: Secondary | ICD-10-CM | POA: Diagnosis not present

## 2023-04-30 DIAGNOSIS — T84093D Other mechanical complication of internal left knee prosthesis, subsequent encounter: Secondary | ICD-10-CM | POA: Diagnosis not present

## 2023-05-03 DIAGNOSIS — Z791 Long term (current) use of non-steroidal anti-inflammatories (NSAID): Secondary | ICD-10-CM | POA: Diagnosis not present

## 2023-05-03 DIAGNOSIS — T84093D Other mechanical complication of internal left knee prosthesis, subsequent encounter: Secondary | ICD-10-CM | POA: Diagnosis not present

## 2023-05-03 DIAGNOSIS — E039 Hypothyroidism, unspecified: Secondary | ICD-10-CM | POA: Diagnosis not present

## 2023-05-03 DIAGNOSIS — R32 Unspecified urinary incontinence: Secondary | ICD-10-CM | POA: Diagnosis not present

## 2023-05-03 DIAGNOSIS — Z9841 Cataract extraction status, right eye: Secondary | ICD-10-CM | POA: Diagnosis not present

## 2023-05-03 DIAGNOSIS — Z9842 Cataract extraction status, left eye: Secondary | ICD-10-CM | POA: Diagnosis not present

## 2023-05-03 DIAGNOSIS — T84023D Instability of internal left knee prosthesis, subsequent encounter: Secondary | ICD-10-CM | POA: Diagnosis not present

## 2023-05-03 DIAGNOSIS — Z87891 Personal history of nicotine dependence: Secondary | ICD-10-CM | POA: Diagnosis not present

## 2023-05-03 DIAGNOSIS — K5900 Constipation, unspecified: Secondary | ICD-10-CM | POA: Diagnosis not present

## 2023-05-03 DIAGNOSIS — I10 Essential (primary) hypertension: Secondary | ICD-10-CM | POA: Diagnosis not present

## 2023-05-03 DIAGNOSIS — Z7982 Long term (current) use of aspirin: Secondary | ICD-10-CM | POA: Diagnosis not present

## 2023-05-05 DIAGNOSIS — Z87891 Personal history of nicotine dependence: Secondary | ICD-10-CM | POA: Diagnosis not present

## 2023-05-05 DIAGNOSIS — I10 Essential (primary) hypertension: Secondary | ICD-10-CM | POA: Diagnosis not present

## 2023-05-05 DIAGNOSIS — T84023D Instability of internal left knee prosthesis, subsequent encounter: Secondary | ICD-10-CM | POA: Diagnosis not present

## 2023-05-05 DIAGNOSIS — Z9842 Cataract extraction status, left eye: Secondary | ICD-10-CM | POA: Diagnosis not present

## 2023-05-05 DIAGNOSIS — Z9841 Cataract extraction status, right eye: Secondary | ICD-10-CM | POA: Diagnosis not present

## 2023-05-05 DIAGNOSIS — T84093D Other mechanical complication of internal left knee prosthesis, subsequent encounter: Secondary | ICD-10-CM | POA: Diagnosis not present

## 2023-05-05 DIAGNOSIS — Z7982 Long term (current) use of aspirin: Secondary | ICD-10-CM | POA: Diagnosis not present

## 2023-05-05 DIAGNOSIS — E039 Hypothyroidism, unspecified: Secondary | ICD-10-CM | POA: Diagnosis not present

## 2023-05-05 DIAGNOSIS — K5900 Constipation, unspecified: Secondary | ICD-10-CM | POA: Diagnosis not present

## 2023-05-05 DIAGNOSIS — R32 Unspecified urinary incontinence: Secondary | ICD-10-CM | POA: Diagnosis not present

## 2023-05-05 DIAGNOSIS — Z791 Long term (current) use of non-steroidal anti-inflammatories (NSAID): Secondary | ICD-10-CM | POA: Diagnosis not present

## 2023-05-06 DIAGNOSIS — T84023D Instability of internal left knee prosthesis, subsequent encounter: Secondary | ICD-10-CM | POA: Diagnosis not present

## 2023-05-07 DIAGNOSIS — T84023D Instability of internal left knee prosthesis, subsequent encounter: Secondary | ICD-10-CM | POA: Diagnosis not present

## 2023-05-07 DIAGNOSIS — Z7982 Long term (current) use of aspirin: Secondary | ICD-10-CM | POA: Diagnosis not present

## 2023-05-07 DIAGNOSIS — R32 Unspecified urinary incontinence: Secondary | ICD-10-CM | POA: Diagnosis not present

## 2023-05-07 DIAGNOSIS — Z87891 Personal history of nicotine dependence: Secondary | ICD-10-CM | POA: Diagnosis not present

## 2023-05-07 DIAGNOSIS — T84093D Other mechanical complication of internal left knee prosthesis, subsequent encounter: Secondary | ICD-10-CM | POA: Diagnosis not present

## 2023-05-07 DIAGNOSIS — Z9841 Cataract extraction status, right eye: Secondary | ICD-10-CM | POA: Diagnosis not present

## 2023-05-07 DIAGNOSIS — K5900 Constipation, unspecified: Secondary | ICD-10-CM | POA: Diagnosis not present

## 2023-05-07 DIAGNOSIS — E039 Hypothyroidism, unspecified: Secondary | ICD-10-CM | POA: Diagnosis not present

## 2023-05-07 DIAGNOSIS — Z791 Long term (current) use of non-steroidal anti-inflammatories (NSAID): Secondary | ICD-10-CM | POA: Diagnosis not present

## 2023-05-07 DIAGNOSIS — I10 Essential (primary) hypertension: Secondary | ICD-10-CM | POA: Diagnosis not present

## 2023-05-07 DIAGNOSIS — Z9842 Cataract extraction status, left eye: Secondary | ICD-10-CM | POA: Diagnosis not present

## 2023-05-12 DIAGNOSIS — Z96652 Presence of left artificial knee joint: Secondary | ICD-10-CM | POA: Diagnosis not present

## 2023-05-12 DIAGNOSIS — R350 Frequency of micturition: Secondary | ICD-10-CM | POA: Diagnosis not present

## 2023-05-12 DIAGNOSIS — E7800 Pure hypercholesterolemia, unspecified: Secondary | ICD-10-CM | POA: Diagnosis not present

## 2023-05-12 DIAGNOSIS — I10 Essential (primary) hypertension: Secondary | ICD-10-CM | POA: Diagnosis not present

## 2023-05-12 DIAGNOSIS — E039 Hypothyroidism, unspecified: Secondary | ICD-10-CM | POA: Diagnosis not present

## 2023-06-03 DIAGNOSIS — M25562 Pain in left knee: Secondary | ICD-10-CM | POA: Diagnosis not present

## 2023-07-20 DIAGNOSIS — M25562 Pain in left knee: Secondary | ICD-10-CM | POA: Diagnosis not present

## 2023-07-22 DIAGNOSIS — M25552 Pain in left hip: Secondary | ICD-10-CM | POA: Diagnosis not present

## 2023-07-26 ENCOUNTER — Ambulatory Visit: Payer: Self-pay

## 2023-07-27 ENCOUNTER — Ambulatory Visit
Admission: RE | Admit: 2023-07-27 | Discharge: 2023-07-27 | Disposition: A | Discharge status: Home / Self Care | Payer: Self-pay | Source: Ambulatory Visit | Attending: Family Medicine | Admitting: Family Medicine

## 2023-07-27 VITALS — BP 130/77 | HR 75 | Temp 98.1°F | Resp 19

## 2023-07-27 DIAGNOSIS — J0100 Acute maxillary sinusitis, unspecified: Secondary | ICD-10-CM

## 2023-07-27 DIAGNOSIS — R059 Cough, unspecified: Secondary | ICD-10-CM | POA: Diagnosis not present

## 2023-07-27 DIAGNOSIS — J309 Allergic rhinitis, unspecified: Secondary | ICD-10-CM | POA: Diagnosis not present

## 2023-07-27 MED ORDER — FEXOFENADINE HCL 180 MG PO TABS
180.0000 mg | ORAL_TABLET | Freq: Every day | ORAL | 0 refills | Status: DC
Start: 2023-07-27 — End: 2023-11-09

## 2023-07-27 MED ORDER — PREDNISONE 20 MG PO TABS
ORAL_TABLET | ORAL | 0 refills | Status: DC
Start: 2023-07-27 — End: 2023-11-09

## 2023-07-27 MED ORDER — AMOXICILLIN-POT CLAVULANATE 875-125 MG PO TABS: 1.0000 | ORAL_TABLET | Freq: Two times a day (BID) | ORAL | 0 refills | Status: DC

## 2023-07-27 NOTE — ED Triage Notes (Signed)
 [facility] with [facility] of facial and chest congestion for 2 weeks. Pt reports she was in [place] and came home with these symptoms flying out soon concerned she may need an antibiotic.

## 2023-07-27 NOTE — Discharge Instructions (Addendum)
 Advised patient to take medications as directed with food to completion.  Advised patient to take prednisone and Allegra with first dose of Augmentin for the next 5 of 7 days.  Advised may use Allegra as needed afterwards for concurrent postnasal drainage/drip/runny nose.  Encouraged to increase daily water intake to 64 ounces per day while taking these medications.  Advised if symptoms worsen and/or unresolved please follow-up with your PCP or here for further evaluation.

## 2023-07-27 NOTE — ED Provider Notes (Signed)
 Allison Harris CARE    CSN: [id number] Arrival date & time: 07/27/23  0850      History   Chief Complaint Chief Complaint  Patient presents with   chest congestion    HPI Allison Harris is a 78 y.o. female.   HPI Very pleasant 78 year old female presents with sinus nasal congestion, chest congestion, and cough for 2 weeks.  PMH significant for HTN and hypothyroidism.  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      Home Medications    Prior to Admission medications   Medication Sig Start Date End Date Taking? Authorizing Provider  amoxicillin-clavulanate (AUGMENTIN) 875-125 MG tablet Take 1 tablet by mouth every 12 (twelve) hours. 07/27/23  Yes Teddy Sharper, FNP  fexofenadine [facility] ALLERGY) 180 MG tablet Take 1 tablet (180 mg total) by mouth daily for 15 days. 07/27/23 08/11/23 Yes Teddy Sharper, FNP  predniSONE (DELTASONE) 20 MG tablet Take 3 tabs PO daily x 5 days. 07/27/23  Yes Teddy Sharper, FNP  cyanocobalamin (VITAMIN B12) 1000 MCG tablet Take 1,000 mcg by mouth daily.    [provider]  lisinopril -hydrochlorothiazide  (ZESTORETIC ) 10-12.5 MG tablet Take 1 tablet by mouth daily. 08/16/15   [provider]  omeprazole  (PRILOSEC) 40 MG capsule Take 1 capsule (40 mg total) by mouth daily for 21 days. 04/21/23 05/12/23  Renae Bernarda HERO, PA-C  polyethylene glycol (MIRALAX ) 17 g packet Take 17 g by mouth daily. 04/21/23    Renae Bernarda HERO, PA-C  sertraline  (ZOLOFT ) 50 MG tablet Take 50 mg by mouth daily. 05/24/20   [provider]  sodium fluoride-calcium carbonate (FLORICAL) 8.3-364 MG CAPS capsule Take 1 capsule by mouth daily.    [provider]  SYNTHROID  25 MCG tablet Take 25 mcg by mouth every morning. 12/15/18   [provider]  vitamin E 180 MG (400 UNITS) capsule Take 400 Units by mouth daily.    [provider]    Family History Family History  Problem Relation Age of Onset   Colon cancer Mother    Heart attack Father    Rheum arthritis Sister    Hypertension Brother    Hypertension Brother    Breast cancer Neg Hx    Rectal cancer Neg Hx    Esophageal cancer Neg Hx    Stomach cancer Neg Hx     Social History Social History   Tobacco Use   Smoking status: Former    Types: Cigarettes   Smokeless tobacco: Never   Tobacco comments:    Quit when she was 33 y never a heavy smoker  [employer]vertising account planner   Vaping status: Never Used  Substance Use Topics   Alcohol  use: Not Currently    Comment: 4 x a week 1 glass of wine   Drug use: Never     Allergies   Patient has no known allergies.   Review  of Systems Review of Systems  HENT:  Positive for sore throat.   All other systems reviewed and are negative.    Physical Exam Triage Vital Signs ED Triage Vitals  Encounter Vitals Group     BP      Girls Systolic BP Percentile      Girls Diastolic BP Percentile      Boys Systolic BP Percentile      Boys Diastolic BP Percentile      Pulse      Resp      Temp      Temp src      SpO2      Weight      Height      Head Circumference      Peak Flow      Pain Score      Pain Loc      Pain Education      Exclude from Growth Chart    No data found.  Updated Vital Signs BP 130/77   Pulse 75   Temp 98.1 F (36.7 C)   Resp 19   SpO2 98%    Physical Exam Vitals and nursing note reviewed.  Constitutional:      Appearance: Normal appearance.  She is well-developed and normal weight. She is ill-appearing.  HENT:     Head: Normocephalic and atraumatic.     Right Ear: Tympanic membrane and external ear normal.     Left Ear: Tympanic membrane and external ear normal.     Ears:     Comments: Significant eustachian tube dysfunction noted bilaterally    Nose:     Right Sinus: Maxillary sinus tenderness present.     Left Sinus: Maxillary sinus tenderness present.     Comments: Turbinates are erythematous/edematous    Mouth/Throat:     Mouth: Mucous membranes are moist.     Pharynx: Oropharynx is clear. Uvula midline.     Comments: Significant amount of clear drainage of posterior oropharynx noted  Eyes:     Extraocular Movements: Extraocular movements intact.     Conjunctiva/sclera: Conjunctivae normal.     Pupils: Pupils are equal, round, and reactive to light.    Cardiovascular:     Rate and Rhythm: Normal rate and regular rhythm.     Pulses: Normal pulses.     Heart sounds: Normal heart sounds.  Pulmonary:     Effort: Pulmonary effort is normal.     Breath sounds: Normal breath sounds. No wheezing, rhonchi or rales.     Comments: Frequent nonproductive cough on exam  Musculoskeletal:        General: Normal range of motion.   Skin:    General: Skin is warm and dry.   Neurological:     General: No focal deficit present.     Mental Status: She is alert and oriented to person, place, and time.   Psychiatric:        Mood and Affect: Mood normal.        Behavior: Behavior normal.        Thought Content: Thought content normal.      UC Treatments / Results  Labs (all labs ordered are listed, but only abnormal results are displayed) Labs Reviewed - No data to display  EKG   Radiology No results found.  Procedures Procedures (including critical care time)  Medications Ordered in UC Medications - No data to display  Initial Impression / Assessment and Plan / UC Course  I  have reviewed the triage vital  signs and the nursing notes.  Pertinent labs & imaging results that were available during my care of the patient were reviewed by me and considered in my medical decision making (see chart for details).     MDM: 1.  Subacute maxillary sinusitis-Rx'd Augmentin 875/125 mg tablet: Take 1 tablet twice daily x 7 days; 2.  Cough, unspecified type-Rx prednisone 20 mg tablet: Take 3 tablets p.o. daily x 5 days; 3.  Allergic rhinitis, unspecified seasonality, unspecified trigger-Rx'd Allegra 180 mg fexofenadine daily x 5 days, then as needed Final Clinical Impressions(s) / UC Diagnoses   Final diagnoses:  Subacute maxillary sinusitis  Cough, unspecified type  Allergic rhinitis, unspecified seasonality, unspecified trigger     Discharge Instructions      Advised patient to take medications as directed with food to completion.  Advised patient to take prednisone and Allegra with first dose of Augmentin for the next 5 of 7 days.  Advised may use Allegra as needed afterwards for concurrent postnasal drainage/drip/runny nose.  Encouraged to increase daily water intake to 64 ounces per day while taking these medications.  Advised if symptoms worsen and/or unresolved please follow-up with your PCP or here for further evaluation.     ED Prescriptions     Medication Sig Dispense Auth. Provider   amoxicillin-clavulanate (AUGMENTIN) 875-125 MG tablet Take 1 tablet by mouth every 12 (twelve) hours. 14 tablet Esthefany Herrig, FNP   predniSONE (DELTASONE) 20 MG tablet Take 3 tabs PO daily x 5 days. 15 tablet Missy Baksh, FNP   fexofenadine (ALLEGRA ALLERGY) 180 MG tablet Take 1 tablet (180 mg total) by mouth daily for 15 days. 15 tablet Huntleigh Doolen, FNP      PDMP not reviewed this encounter.   Teddy Sharper, FNP 07/27/23 [phone]

## 2023-08-26 DIAGNOSIS — I10 Essential (primary) hypertension: Secondary | ICD-10-CM | POA: Diagnosis not present

## 2023-08-26 DIAGNOSIS — E7800 Pure hypercholesterolemia, unspecified: Secondary | ICD-10-CM | POA: Diagnosis not present

## 2023-08-26 DIAGNOSIS — F4321 Adjustment disorder with depressed mood: Secondary | ICD-10-CM | POA: Diagnosis not present

## 2023-08-26 DIAGNOSIS — Z0000 Encounter for general adult medical examination without abnormal findings: Secondary | ICD-10-CM | POA: Diagnosis not present

## 2023-08-26 DIAGNOSIS — Z1331 Encounter for screening for depression: Secondary | ICD-10-CM | POA: Diagnosis not present

## 2023-08-26 DIAGNOSIS — E039 Hypothyroidism, unspecified: Secondary | ICD-10-CM | POA: Diagnosis not present

## 2023-09-21 DIAGNOSIS — M25552 Pain in left hip: Secondary | ICD-10-CM | POA: Diagnosis not present

## 2023-09-27 DIAGNOSIS — M25552 Pain in left hip: Secondary | ICD-10-CM | POA: Diagnosis not present

## 2023-10-07 DIAGNOSIS — M25552 Pain in left hip: Secondary | ICD-10-CM | POA: Diagnosis not present

## 2023-10-19 DIAGNOSIS — M25552 Pain in left hip: Secondary | ICD-10-CM | POA: Diagnosis not present

## 2023-10-22 DIAGNOSIS — M1612 Unilateral primary osteoarthritis, left hip: Secondary | ICD-10-CM | POA: Diagnosis not present

## 2023-10-27 DIAGNOSIS — M6281 Muscle weakness (generalized): Secondary | ICD-10-CM | POA: Diagnosis not present

## 2023-10-27 DIAGNOSIS — M1612 Unilateral primary osteoarthritis, left hip: Secondary | ICD-10-CM | POA: Diagnosis not present

## 2023-10-27 DIAGNOSIS — R262 Difficulty in walking, not elsewhere classified: Secondary | ICD-10-CM | POA: Diagnosis not present

## 2023-11-03 DIAGNOSIS — M1612 Unilateral primary osteoarthritis, left hip: Secondary | ICD-10-CM | POA: Diagnosis not present

## 2023-11-03 DIAGNOSIS — R262 Difficulty in walking, not elsewhere classified: Secondary | ICD-10-CM | POA: Diagnosis not present

## 2023-11-03 DIAGNOSIS — M6281 Muscle weakness (generalized): Secondary | ICD-10-CM | POA: Diagnosis not present

## 2023-11-04 DIAGNOSIS — M1612 Unilateral primary osteoarthritis, left hip: Secondary | ICD-10-CM | POA: Diagnosis not present

## 2023-11-09 ENCOUNTER — Other Ambulatory Visit: Payer: Self-pay

## 2023-11-09 ENCOUNTER — Ambulatory Visit
Admission: RE | Admit: 2023-11-09 | Discharge: 2023-11-09 | Disposition: A | Discharge status: Home / Self Care | Attending: Family Medicine | Admitting: Family Medicine

## 2023-11-09 VITALS — BP 127/75 | HR 92 | Temp 98.1°F | Resp 16

## 2023-11-09 DIAGNOSIS — R300 Dysuria: Secondary | ICD-10-CM | POA: Diagnosis not present

## 2023-11-09 LAB — POCT URINALYSIS DIP (MANUAL ENTRY)
Bilirubin, UA: NEGATIVE
Glucose, UA: NEGATIVE mg/dL
Ketones, POC UA: NEGATIVE mg/dL
Nitrite, UA: NEGATIVE
Protein Ur, POC: 30 mg/dL — AB
Spec Grav, UA: >=1.03 — AB (ref 1.010–1.025)
Urobilinogen, UA: 0.2 U/dL
pH, UA: 6 (ref 5.0–8.0)

## 2023-11-09 MED ORDER — CIPROFLOXACIN HCL 500 MG PO TABS: 500.0000 mg | ORAL_TABLET | Freq: Two times a day (BID) | ORAL | 0 refills | Status: DC

## 2023-11-09 NOTE — Discharge Instructions (Signed)
 Stop the sulfa antibiotic Take Cipro 2 times a day May use Azo for discomfort Increase your fluid intake  The urine has been sent to the laboratory for culture.  I am concerned the culture may not be accurate because of your antibiotic use.  Take 1 week of Cipro.  See your primary care doctor if you continue to have urinary symptoms

## 2023-11-09 NOTE — ED Triage Notes (Addendum)
 X 1 week has had urinary frequency. 2 weeks ago had hip replacement with catheter in place. Pcp gave rx for bactrim, which is not helped. Did not have UA, rx was called in. No fever. Has been taking tylenol .

## 2023-11-09 NOTE — ED Provider Notes (Signed)
 Allison Harris    CSN: [id number] Arrival date & time: 11/09/23  0949      History   Chief Complaint Chief Complaint  Patient presents with   Urinary Frequency    Entered by patient    HPI Allison Harris is a 78 y.o. female.   Patient has been having urinary frequency and dysuria.  For the last 5 days she has been on Septra.  In spite of that she continues to have symptoms.  She feels like she needs a different antibiotic.  She states she recently had a hip replacement and had a Foley catheter.  She thinks this might be contributing to why she has an infection.  She states she has had urinary infections in the past.  She was seen here in March 2025 for urinary tract infection symptoms, but her culture came back negative.  She is not having any nausea vomiting, flank pain, abdominal pain signs of kidney infection.  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      Home Medications    Prior to Admission medications   Medication Sig Start Date End Date Taking? Authorizing Provider  ciprofloxacin (CIPRO) 500 MG tablet Take 1 tablet (500 mg total) by mouth every 12 (twelve) hours. 11/09/23  Yes Maranda Jamee Jacob, MD  sulfamethoxazole-trimethoprim (BACTRIM DS) 800-160 MG tablet Take 1 tablet by mouth 2 (two) times daily.   Yes [provider]  cyanocobalamin (VITAMIN B12) 1000 MCG tablet Take 1,000 mcg by mouth daily.     [provider]  lisinopril -hydrochlorothiazide  (ZESTORETIC ) 10-12.5 MG tablet Take 1 tablet by mouth daily. 08/16/15   [provider]  sertraline  (ZOLOFT ) 50 MG tablet Take 50 mg by mouth daily. 05/24/20   [provider]  sodium fluoride-calcium carbonate (FLORICAL) 8.3-364 MG CAPS capsule Take 1 capsule by mouth daily.    [provider]  SYNTHROID  25 MCG tablet Take 25 mcg by mouth every morning. 12/15/18   [provider]  vitamin E 180 MG (400 UNITS) capsule Take 400 Units by mouth daily.    [provider]    Family History Family History  Problem Relation Age of Onset   Colon cancer Mother    Heart attack Father    Rheum arthritis Sister    Hypertension Brother    Hypertension Brother    Breast cancer Neg Hx    Rectal cancer Neg Hx    Esophageal cancer Neg Hx    Stomach cancer Neg Hx     Social History Social History   Tobacco Use   Smoking status: Former    Types: Cigarettes   Smokeless tobacco: Never   Tobacco comments:    Quit when she was 87 y never a heavy smoker  Vaping Use   Vaping status: Never Used  Substance Use Topics  Alcohol  use: Not Currently    Comment: 4 x a week 1 glass of wine   Drug use: Never     Allergies   Patient has no known allergies.   Review of Systems Review of Systems  See HPI Physical Exam Triage Vital Signs ED Triage Vitals  Encounter Vitals Group     BP 11/09/23 0954 127/75     Girls Systolic BP Percentile --      Girls Diastolic BP Percentile --      Boys Systolic BP Percentile --      Boys Diastolic BP Percentile --      Pulse Rate 11/09/23 0954 92     Resp 11/09/23 0954 16     Temp 11/09/23 0954 98.1 F (36.7 C)     Temp src --      SpO2 11/09/23 0954 94 %     Weight --      Height --      Head Circumference --      Peak Flow --      Pain Score 11/09/23 0958 0     Pain Loc --      Pain Education --      Exclude from Growth Chart --    No data  found.  Updated Vital Signs BP 127/75   Pulse 92   Temp 98.1 F (36.7 C)   Resp 16   SpO2 94%      Physical Exam Constitutional:      General: She is not in acute distress.    Appearance: She is well-developed.  HENT:     Head: Normocephalic and atraumatic.  Eyes:     Conjunctiva/sclera: Conjunctivae normal.     Pupils: Pupils are equal, round, and reactive to light.  Cardiovascular:     Rate and Rhythm: Normal rate.  Pulmonary:     Effort: Pulmonary effort is normal. No respiratory distress.  Abdominal:     General: There is distension.     Palpations: Abdomen is soft.     Tenderness: There is no right CVA tenderness or left CVA tenderness.  Musculoskeletal:        General: Normal range of motion.     Cervical back: Normal range of motion.  Skin:    General: Skin is warm and dry.  Neurological:     Mental Status: She is alert.      UC Treatments / Results  Labs (all labs ordered are listed, but only abnormal results are displayed) Labs Reviewed  POCT URINALYSIS DIP (MANUAL ENTRY) - Abnormal; Notable for the following components:      Result Value   Clarity, UA cloudy (*)    Spec Grav, UA >=1.030 (*)    Blood, UA small (*)    Protein Ur, POC =30 (*)    Leukocytes, UA Small (1+) (*)    All other components within normal limits  URINE CULTURE    EKG   Radiology No results found.  Procedures Procedures (including critical Harris time)  Medications Ordered in UC Medications - No data to display  Initial Impression / Assessment and Plan / UC Course  I have reviewed the triage vital signs and the nursing notes.  Pertinent labs & imaging results that were available during my Harris of the patient were reviewed by me and considered in my medical decision making (see chart for details).     I explained to the patient that the urine culture may not grow bacteria given the  fact that she is on the sulfa antibiotic.  She may have a resistant bacteria that is  difficult to identify.  I would have put her on a different antibiotic, have her increase her fluids, and follow-up with her primary Harris doctor.  Dysuria instructions are given Final Clinical Impressions(s) / UC Diagnoses   Final diagnoses:  Dysuria     Discharge Instructions      Stop the sulfa antibiotic Take Cipro 2 times a day May use Azo for discomfort Increase your fluid intake  The urine has been sent to the laboratory for culture.  I am concerned the culture may not be accurate because of your antibiotic use.  Take 1 week of Cipro.  See your primary Harris doctor if you continue to have urinary symptoms   ED Prescriptions     Medication Sig Dispense Auth. Provider   ciprofloxacin (CIPRO) 500 MG tablet Take 1 tablet (500 mg total) by mouth every 12 (twelve) hours. 14 tablet Maranda Jamee Jacob, MD      PDMP not reviewed this encounter.   Maranda Jamee Jacob, MD 11/09/23 1044

## 2023-11-10 DIAGNOSIS — M6281 Muscle weakness (generalized): Secondary | ICD-10-CM | POA: Diagnosis not present

## 2023-11-10 DIAGNOSIS — M1612 Unilateral primary osteoarthritis, left hip: Secondary | ICD-10-CM | POA: Diagnosis not present

## 2023-11-12 LAB — URINE CULTURE
Culture: 30000 — AB
Special Requests: NORMAL

## 2023-11-15 ENCOUNTER — Ambulatory Visit (HOSPITAL_COMMUNITY): Payer: Self-pay

## 2023-11-17 DIAGNOSIS — M6281 Muscle weakness (generalized): Secondary | ICD-10-CM | POA: Diagnosis not present

## 2023-11-17 DIAGNOSIS — M1612 Unilateral primary osteoarthritis, left hip: Secondary | ICD-10-CM | POA: Diagnosis not present

## 2023-11-24 DIAGNOSIS — M1612 Unilateral primary osteoarthritis, left hip: Secondary | ICD-10-CM | POA: Diagnosis not present

## 2023-11-24 DIAGNOSIS — M6281 Muscle weakness (generalized): Secondary | ICD-10-CM | POA: Diagnosis not present

## 2023-11-26 ENCOUNTER — Other Ambulatory Visit: Payer: Self-pay | Admitting: Family Medicine

## 2023-11-26 DIAGNOSIS — Z1231 Encounter for screening mammogram for malignant neoplasm of breast: Secondary | ICD-10-CM

## 2023-11-30 DIAGNOSIS — M6281 Muscle weakness (generalized): Secondary | ICD-10-CM | POA: Diagnosis not present

## 2023-11-30 DIAGNOSIS — M1612 Unilateral primary osteoarthritis, left hip: Secondary | ICD-10-CM | POA: Diagnosis not present

## 2023-12-02 DIAGNOSIS — Z96642 Presence of left artificial hip joint: Secondary | ICD-10-CM | POA: Diagnosis not present

## 2023-12-02 DIAGNOSIS — Z471 Aftercare following joint replacement surgery: Secondary | ICD-10-CM | POA: Diagnosis not present

## 2023-12-08 DIAGNOSIS — M6281 Muscle weakness (generalized): Secondary | ICD-10-CM | POA: Diagnosis not present

## 2023-12-08 DIAGNOSIS — M1612 Unilateral primary osteoarthritis, left hip: Secondary | ICD-10-CM | POA: Diagnosis not present

## 2023-12-10 ENCOUNTER — Ambulatory Visit

## 2023-12-17 DIAGNOSIS — M24562 Contracture, left knee: Secondary | ICD-10-CM | POA: Diagnosis not present

## 2023-12-17 DIAGNOSIS — M6281 Muscle weakness (generalized): Secondary | ICD-10-CM | POA: Diagnosis not present

## 2023-12-17 DIAGNOSIS — M1612 Unilateral primary osteoarthritis, left hip: Secondary | ICD-10-CM | POA: Diagnosis not present

## 2023-12-20 DIAGNOSIS — M6281 Muscle weakness (generalized): Secondary | ICD-10-CM | POA: Diagnosis not present

## 2023-12-20 DIAGNOSIS — M1612 Unilateral primary osteoarthritis, left hip: Secondary | ICD-10-CM | POA: Diagnosis not present

## 2023-12-27 DIAGNOSIS — M6281 Muscle weakness (generalized): Secondary | ICD-10-CM | POA: Diagnosis not present

## 2023-12-27 DIAGNOSIS — M1612 Unilateral primary osteoarthritis, left hip: Secondary | ICD-10-CM | POA: Diagnosis not present

## 2023-12-29 ENCOUNTER — Ambulatory Visit

## 2024-01-11 NOTE — Progress Notes (Signed)
 Sent message, via epic in basket, requesting orders in epic from Careers adviser.

## 2024-01-13 DIAGNOSIS — Z96652 Presence of left artificial knee joint: Secondary | ICD-10-CM | POA: Diagnosis not present

## 2024-01-13 DIAGNOSIS — Z48811 Encounter for surgical aftercare following surgery on the nervous system: Secondary | ICD-10-CM | POA: Diagnosis not present

## 2024-01-17 ENCOUNTER — Ambulatory Visit: Payer: Self-pay | Admitting: Emergency Medicine

## 2024-01-17 DIAGNOSIS — G8929 Other chronic pain: Secondary | ICD-10-CM

## 2024-01-17 DIAGNOSIS — T84093D Other mechanical complication of internal left knee prosthesis, subsequent encounter: Secondary | ICD-10-CM

## 2024-01-17 NOTE — Progress Notes (Signed)
 COVID Vaccine received:  []  No [x]  Yes Date of any COVID positive Test in last 90 days: no PCP - Alberta Sharps MD Cardiologist - n/a  Chest x-ray -  EKG -  04/08/23 Epic Stress Test -  ECHO -  Cardiac Cath -   Bowel Prep - [x]  No  []   Yes ______  Pacemaker / ICD device [x]  No []  Yes   Spinal Cord Stimulator:[x]  No []  Yes       History of Sleep Apnea? [x]  No []  Yes   CPAP used?- [x]  No []  Yes    Does the patient monitor blood sugar?          [x]  No []  Yes  []  N/A  Patient has: [x]  NO Hx DM   []  Pre-DM                 []  DM1  []   DM2 Does patient have a [employer] or [employer]? []  No []  Yes   Fasting Blood Sugar Ranges-  Checks Blood Sugar _____ times a day  GLP1 agonist / usual dose - no GLP1 instructions:  SGLT-2 inhibitors / usual dose - no SGLT-2 instructions:   Blood Thinner / Instructions:no Aspirin  Instructions:no  Comments:   Activity level: Patient is able  to climb a flight of stairs without difficulty; [x]  No CP  [x]  No SOB,    Patient can perform ADLs without assistance.   Anesthesia review:   Patient denies shortness of breath, fever, cough and chest pain at PAT appointment.  Patient verbalized understanding and agreement to the Pre-Surgical Instructions that were given to them at this PAT appointment. Patient was also educated of the need to review these PAT instructions again prior to his/her surgery.I reviewed the appropriate phone numbers to call if they have any and questions or concerns.

## 2024-01-17 NOTE — H&P (View-Only) (Signed)
 TOTAL KNEE REVISION ADMISSION H&P  Patient is being admitted for left revision total knee arthroplasty.  Subjective:  Chief Complaint:left knee pain.  HPI: Allison Harris, 78 y.o. female, has a history of pain and functional disability in the left knee(s) due to failed previous arthroplasty and patient has failed non-surgical conservative treatments for greater than 12 weeks to include NSAID's and/or analgesics, supervised PT with diminished ADL's post treatment, use of assistive devices, and activity modification. The indications for the revision of the total knee arthroplasty are post-operative stiffness and flexion contracture. Onset of symptoms was gradual starting 1 years ago with gradually worsening course since that time.  Prior procedures on the left knee(s) include arthroplasty and poly exchange.  Patient currently rates pain in the left knee(s) at 8 out of 10 with activity. There is night pain, worsening of pain with activity and weight bearing, pain that interferes with activities of daily living, and pain with passive range of motion.  Patient has evidence of components in decent position by imaging studies. This condition presents safety issues increasing the risk of falls.  There is no current active infection.  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      b  complex vitamins capsule Take 1 capsule by mouth daily.      ciprofloxacin  (CIPRO ) 500 MG tablet Take 1 tablet (500 mg total) by mouth every 12 (twelve) hours. (Patient not taking: Reported on 01/17/2024) 14 tablet 0    lisinopril -hydrochlorothiazide  (ZESTORETIC ) 10-12.5 MG tablet Take 1 tablet by mouth daily.      Multiple Vitamin (MULTIVITAMIN WITH MINERALS) TABS tablet Take 1 tablet by mouth daily.      sertraline  (ZOLOFT ) 50 MG tablet Take 50 mg by mouth daily.      SYNTHROID  25 MCG tablet Take 25 mcg by mouth daily before breakfast.      vitamin E 180 MG (400 UNITS) capsule Take 400 Units by mouth daily.      No current facility-administered medications for this visit.   Allergies[1]  Social History   Tobacco Use   Smoking status: Former    Types: Cigarettes   Smokeless tobacco: Never   Tobacco comments:    Quit when she was 89 y never a heavy smoker  Substance Use Topics   Alcohol  use: Not Currently    Comment: 4 x a week 1 glass of wine    Family History  Problem Relation Age of Onset   Colon cancer Mother    Heart attack Father    Rheum arthritis Sister    Hypertension Brother    Hypertension Brother    Breast cancer Neg Hx    Rectal cancer Neg Hx    Esophageal cancer Neg Hx    Stomach cancer Neg Hx  Review of Systems  Musculoskeletal:  Positive for arthralgias.  All other systems reviewed and are negative.    Objective:  Physical Exam Constitutional:      General: She is not in acute distress.    Appearance: Normal appearance. She is not ill-appearing.  HENT:     Head: Normocephalic and atraumatic.     Right Ear: External ear normal.     Left Ear: External ear normal.     Nose: Nose normal.     Mouth/Throat:     Mouth: Mucous membranes are moist.     Pharynx: Oropharynx is clear.  Eyes:     Extraocular Movements: Extraocular movements intact.     Conjunctiva/sclera: Conjunctivae normal.  Cardiovascular:     Rate and Rhythm: Normal rate.      Pulses: Normal pulses.  Pulmonary:     Effort: Pulmonary effort is normal.  Abdominal:     General: Bowel sounds are normal.     Palpations: Abdomen is soft.  Musculoskeletal:        General: Tenderness present.     Cervical back: Normal range of motion and neck supple.     Comments: TTP over general knee.  No calf tenderness, swelling, or erythema.  Well healed vertical incision, otherwise no overlying lesions of area of chief complaint.  Decreased strength and ROM due to elicited pain, notable flexion contraction appreciated.  Dorsiflexion and plantarflexion intact.  Stable to varus and valgus stress.  BLE appear grossly neurovascularly intact.  Gait mildly antalgic.   Skin:    General: Skin is warm and dry.  Neurological:     Mental Status: She is alert and oriented to person, place, and time. Mental status is at baseline.  Psychiatric:        Mood and Affect: Mood normal.        Behavior: Behavior normal.     Vital signs in last 24 hours: @VSRANGES @  Labs:  Estimated body mass index is 25.16 kg/m as calculated from the following:   Height as of 04/21/23: 5' 6 (1.676 m).   Weight as of 04/21/23: 70.7 kg.  Imaging Review Plain radiographs demonstrate left total knee arthroplasty components in good The overall alignment is neutral.There is no evidence of loosening of the femoral and tibial components. The bone quality appears to be fair for age and reported activity level. There is less than optimal space between femoral and tibial components.    Assessment/Plan:  Post-operative stiffness and flexion contracture,  left knee(s) with failed previous arthroplasty.   The patient history, physical examination, clinical judgment of the provider and imaging studies are consistent with ost-operative stiffness and flexion contracture of the left knee(s), previous total knee arthroplasty. Revision total knee arthroplasty is deemed medically necessary. The treatment options including  medical management, injection therapy, arthroscopy and revision arthroplasty were discussed at length. The risks and benefits of revision total knee arthroplasty were presented and reviewed. The risks due to aseptic loosening, infection, stiffness, patella tracking problems, thromboembolic complications and other imponderables were discussed. The patient acknowledged the explanation, agreed to proceed with the plan and consent was signed. Patient is being admitted for inpatient treatment for surgery, pain control, PT, OT, prophylactic antibiotics, VTE prophylaxis, progressive ambulation and ADL's and discharge planning.The patient is planning to be discharged home with home health services ([facility])      [1] No Known Allergies

## 2024-01-17 NOTE — H&P (Signed)
 TOTAL KNEE REVISION ADMISSION H&P  Patient is being admitted for left revision total knee arthroplasty.  Subjective:  Chief Complaint:left knee pain.  HPI: Allison Harris, 78 y.o. female, has a history of pain and functional disability in the left knee(s) due to failed previous arthroplasty and patient has failed non-surgical conservative treatments for greater than 12 weeks to include NSAID's and/or analgesics, supervised PT with diminished ADL's post treatment, use of assistive devices, and activity modification. The indications for the revision of the total knee arthroplasty are post-operative stiffness and flexion contracture. Onset of symptoms was gradual starting 1 years ago with gradually worsening course since that time.  Prior procedures on the left knee(s) include arthroplasty and poly exchange.  Patient currently rates pain in the left knee(s) at 8 out of 10 with activity. There is night pain, worsening of pain with activity and weight bearing, pain that interferes with activities of daily living, and pain with passive range of motion.  Patient has evidence of components in decent position by imaging studies. This condition presents safety issues increasing the risk of falls.  There is no current active infection.  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      b  complex vitamins capsule Take 1 capsule by mouth daily.      ciprofloxacin  (CIPRO ) 500 MG tablet Take 1 tablet (500 mg total) by mouth every 12 (twelve) hours. (Patient not taking: Reported on 01/17/2024) 14 tablet 0    lisinopril -hydrochlorothiazide  (ZESTORETIC ) 10-12.5 MG tablet Take 1 tablet by mouth daily.      Multiple Vitamin (MULTIVITAMIN WITH MINERALS) TABS tablet Take 1 tablet by mouth daily.      sertraline  (ZOLOFT ) 50 MG tablet Take 50 mg by mouth daily.      SYNTHROID  25 MCG tablet Take 25 mcg by mouth daily before breakfast.      vitamin E 180 MG (400 UNITS) capsule Take 400 Units by mouth daily.      No current facility-administered medications for this visit.   Allergies[1]  Social History   Tobacco Use   Smoking status: Former    Types: Cigarettes   Smokeless tobacco: Never   Tobacco comments:    Quit when she was 89 y never a heavy smoker  Substance Use Topics   Alcohol  use: Not Currently    Comment: 4 x a week 1 glass of wine    Family History  Problem Relation Age of Onset   Colon cancer Mother    Heart attack Father    Rheum arthritis Sister    Hypertension Brother    Hypertension Brother    Breast cancer Neg Hx    Rectal cancer Neg Hx    Esophageal cancer Neg Hx    Stomach cancer Neg Hx  Review of Systems  Musculoskeletal:  Positive for arthralgias.  All other systems reviewed and are negative.    Objective:  Physical Exam Constitutional:      General: She is not in acute distress.    Appearance: Normal appearance. She is not ill-appearing.  HENT:     Head: Normocephalic and atraumatic.     Right Ear: External ear normal.     Left Ear: External ear normal.     Nose: Nose normal.     Mouth/Throat:     Mouth: Mucous membranes are moist.     Pharynx: Oropharynx is clear.  Eyes:     Extraocular Movements: Extraocular movements intact.     Conjunctiva/sclera: Conjunctivae normal.  Cardiovascular:     Rate and Rhythm: Normal rate.      Pulses: Normal pulses.  Pulmonary:     Effort: Pulmonary effort is normal.  Abdominal:     General: Bowel sounds are normal.     Palpations: Abdomen is soft.  Musculoskeletal:        General: Tenderness present.     Cervical back: Normal range of motion and neck supple.     Comments: TTP over general knee.  No calf tenderness, swelling, or erythema.  Well healed vertical incision, otherwise no overlying lesions of area of chief complaint.  Decreased strength and ROM due to elicited pain, notable flexion contraction appreciated.  Dorsiflexion and plantarflexion intact.  Stable to varus and valgus stress.  BLE appear grossly neurovascularly intact.  Gait mildly antalgic.   Skin:    General: Skin is warm and dry.  Neurological:     Mental Status: She is alert and oriented to person, place, and time. Mental status is at baseline.  Psychiatric:        Mood and Affect: Mood normal.        Behavior: Behavior normal.     Vital signs in last 24 hours: @VSRANGES @  Labs:  Estimated body mass index is 25.16 kg/m as calculated from the following:   Height as of 04/21/23: 5' 6 (1.676 m).   Weight as of 04/21/23: 70.7 kg.  Imaging Review Plain radiographs demonstrate left total knee arthroplasty components in good The overall alignment is neutral.There is no evidence of loosening of the femoral and tibial components. The bone quality appears to be fair for age and reported activity level. There is less than optimal space between femoral and tibial components.    Assessment/Plan:  Post-operative stiffness and flexion contracture,  left knee(s) with failed previous arthroplasty.   The patient history, physical examination, clinical judgment of the provider and imaging studies are consistent with ost-operative stiffness and flexion contracture of the left knee(s), previous total knee arthroplasty. Revision total knee arthroplasty is deemed medically necessary. The treatment options including  medical management, injection therapy, arthroscopy and revision arthroplasty were discussed at length. The risks and benefits of revision total knee arthroplasty were presented and reviewed. The risks due to aseptic loosening, infection, stiffness, patella tracking problems, thromboembolic complications and other imponderables were discussed. The patient acknowledged the explanation, agreed to proceed with the plan and consent was signed. Patient is being admitted for inpatient treatment for surgery, pain control, PT, OT, prophylactic antibiotics, VTE prophylaxis, progressive ambulation and ADL's and discharge planning.The patient is planning to be discharged home with home health services ([facility])      [1] No Known Allergies

## 2024-01-17 NOTE — Patient Instructions (Addendum)
 SURGICAL WAITING ROOM VISITATION  Patients having surgery or a procedure may have no more than 2 support people in the waiting area - these visitors may rotate.    Children ages 29 and under will not be able to visit patients in [facility] under most circumstances.   Visitors with respiratory illnesses are discouraged from visiting and should remain at home.  If the patient needs to stay at the hospital during part of their recovery, the visitor guidelines for inpatient rooms apply. Pre-op nurse will coordinate an appropriate time for 1 support person to accompany patient in pre-op.  This support person may not rotate.    Please refer to the [facility] website for the visitor guidelines for Inpatients (after your surgery is over and you are in a regular room).       Your procedure is scheduled on: 01/31/24   Report to [facility] Main Entrance    Report to admitting at 10:15 AM   Call this number if you have problems the morning of surgery [phone]   Do not eat food :After Midnight.   After Midnight you may have the following liquids until 9:45 AM DAY OF SURGERY  Water Non-Citrus Juices (without pulp, NO RED-Apple, White grape, White cranberry) Black Coffee (NO MILK/CREAM OR CREAMERS, sugar ok)  Clear Tea (NO MILK/CREAM OR CREAMERS, sugar ok) regular and decaf                             Plain Jell-O (NO RED)                                           Fruit ices (not with fruit pulp, NO RED)                                     Popsicles (NO RED)                                                               Sports drinks like Gatorade (NO RED)                The day of surgery:  Drink ONE (1) Pre-Surgery Clear Ensure  at  9:45 AM the morning of surgery. Drink in one sitting. Do not sip.  This drink was given to you during your hospital  pre-op appointment visit. Nothing else to drink after completing the  Pre-Surgery Clear Ensure.   Oral Hygiene is  also important to reduce your risk of infection.                                    Remember - BRUSH YOUR TEETH THE MORNING OF SURGERY WITH YOUR REGULAR TOOTHPASTE  DENTURES WILL BE REMOVED PRIOR TO SURGERY PLEASE DO NOT APPLY Poly grip OR ADHESIVES!!!    Stop all vitamins and herbal supplements 7 days before surgery.   Take these medicines the morning of surgery with A SIP OF WATER: Sertraline , synthroid .  Do not take Zestoretic (lisinopril /hydrochlorothiazide ) the morning of surgery.              You may not have any metal on your body including hair pins, jewelry, and body piercing             Do not wear make-up, lotions, powders, perfumes/cologne, or deodorant  Do not wear nail polish including gel and S&S, artificial/acrylic nails, or any other type of covering on natural nails including finger and toenails. If you have artificial nails, gel coating, etc. that needs to be removed by a nail salon please have this removed prior to surgery or surgery may need to be canceled/ delayed if the surgeon/ anesthesia feels like they are unable to be safely monitored.   Do not shave  48 hours prior to surgery.             Do not bring valuables to the hospital. [facility] IS NOT             RESPONSIBLE   FOR VALUABLES.   Contacts, glasses, dentures or bridgework may not be worn into surgery.   Bring small overnight bag day of surgery.   DO NOT BRING YOUR HOME MEDICATIONS TO THE HOSPITAL. PHARMACY WILL DISPENSE MEDICATIONS LISTED ON YOUR MEDICATION LIST TO YOU DURING YOUR ADMISSION IN THE HOSPITAL!    Patients discharged on the day of surgery will not be allowed to drive home.  Someone NEEDS to stay with you for the first 24 hours after anesthesia.   Special Instructions: Bring a copy of your healthcare power of attorney and living will documents the day of surgery if you haven't scanned them before.              Please read over the following fact sheets you were given: IF  YOU HAVE QUESTIONS ABOUT YOUR PRE-OP INSTRUCTIONS PLEASE [phone] [place]   If you received a COVID test during your pre-op visit  it is requested that you wear a mask when out in public, stay away from anyone that may not be feeling well and notify your surgeon if you develop symptoms. If you test positive for Covid or have been in contact with anyone that has tested positive in the last 10 days please notify you surgeon.      Pre-operative 4 CHG Bath Instructions  DYNA-Hex 4 Chlorhexidine  Gluconate 4% Solution Antiseptic 4 fl. oz   You can play a key role in reducing the risk of infection after surgery. Your skin needs to be as free of germs as possible. You can reduce the number of germs on your skin by washing with CHG (chlorhexidine  gluconate) soap before surgery. CHG is an antiseptic soap that kills germs and continues to kill germs even after washing.   DO NOT use if you have an allergy to chlorhexidine /CHG or antibacterial soaps. If your skin becomes reddened or irritated, stop using the CHG and notify one of our RNs at   Please shower with the CHG soap starting 4 days before surgery using the following schedule:     Please keep in mind the following:  DO NOT shave, including legs and underarms, starting the day of your first shower.   You may shave your face at any point before/day of surgery.  Place clean sheets on your bed the day you start using CHG soap. Use a clean washcloth (not used since being washed) for each shower. DO NOT sleep with pets once you start using the CHG.  CHG Shower Instructions:  If you choose to wash your hair and private area, wash first with your normal shampoo/soap.  After you use shampoo/soap, rinse your hair and body thoroughly to remove shampoo/soap residue.  Turn the water OFF and apply about 3 tablespoons (45 ml) of CHG soap to a CLEAN washcloth.  Apply CHG soap ONLY FROM YOUR NECK DOWN TO YOUR TOES (washing for 3-5 minutes)  DO NOT use  CHG soap on face, private areas, open wounds, or sores.  Pay special attention to the area where your surgery is being performed.  If you are having back surgery, having someone wash your back for you may be helpful. Wait 2 minutes after CHG soap is applied, then you may rinse off the CHG soap.  Pat dry with a clean towel  Put on clean clothes/pajamas   If you choose to wear lotion, please use ONLY the CHG-compatible lotions on the back of this paper.     Additional instructions for the day of surgery: DO NOT APPLY any lotions, deodorants, cologne, or perfumes.   Put on clean/comfortable clothes.  Brush your teeth.  Ask your nurse before applying any prescription medications to the skin.   CHG Compatible Lotions   Aveeno Moisturizing lotion  Cetaphil Moisturizing Cream  Cetaphil Moisturizing Lotion  Clairol Herbal Essence Moisturizing Lotion, Dry Skin  Clairol Herbal Essence Moisturizing Lotion, Extra Dry Skin  Clairol Herbal Essence Moisturizing Lotion, Normal Skin  Curel Age Defying Therapeutic Moisturizing Lotion with Alpha Hydroxy  Curel Extreme Care Body Lotion  Curel Soothing Hands Moisturizing Hand Lotion  Curel Therapeutic Moisturizing Cream, Fragrance-Free  Curel Therapeutic Moisturizing Lotion, Fragrance-Free  Curel Therapeutic Moisturizing Lotion, Original Formula  Eucerin Daily Replenishing Lotion  Eucerin Dry Skin Therapy Plus Alpha Hydroxy Crme  Eucerin Dry Skin Therapy Plus Alpha Hydroxy Lotion  Eucerin Original Crme  Eucerin Original Lotion  Eucerin Plus Crme Eucerin Plus Lotion  Eucerin TriLipid Replenishing Lotion  Keri Anti-Bacterial Hand Lotion  Keri Deep Conditioning Original Lotion Dry Skin Formula Softly Scented  Keri Deep Conditioning Original Lotion, Fragrance Free Sensitive Skin Formula  Keri Lotion Fast Absorbing Fragrance Free Sensitive Skin Formula  Keri Lotion Fast Absorbing Softly Scented Dry Skin Formula  Keri Original Lotion  Keri Skin  Renewal Lotion Keri Silky Smooth Lotion  Keri Silky Smooth Sensitive Skin Lotion  Nivea Body Creamy Conditioning Oil  Nivea Body Extra Enriched Lotion  Nivea Body Original Lotion  Nivea Body Sheer Moisturizing Lotion Nivea Crme  Nivea Skin Firming Lotion  NutraDerm 30 Skin Lotion  NutraDerm Skin Lotion  NutraDerm Therapeutic Skin Cream  NutraDerm Therapeutic Skin Lotion  ProShield Protective Hand Cream  Incentive Spirometer (Watch this video at home: [email or web address])  An incentive spirometer is a tool that can help keep your lungs clear and active. This tool measures how well you are filling your lungs with each breath. Taking long deep breaths may help reverse or decrease the chance of developing breathing (pulmonary) problems (especially infection) following: A long period of time when you are unable to move or be active. BEFORE THE PROCEDURE  If the spirometer includes an indicator to show your best effort, your nurse or respiratory therapist will set it to a desired goal. If possible, sit up straight or lean slightly forward. Try not to slouch. Hold the incentive spirometer in an upright position. INSTRUCTIONS FOR USE  Sit on the edge of your bed if possible, or sit up as far as you can in bed or on a  chair. Hold the incentive spirometer in an upright position. Breathe out normally. Place the mouthpiece in your mouth and seal your lips tightly around it. Breathe in slowly and as deeply as possible, raising the piston or the ball toward the top of the column. Hold your breath for 3-5 seconds or for as long as possible. Allow the piston or ball to fall to the bottom of the column. Remove the mouthpiece from your mouth and breathe out normally. Rest for a few seconds and repeat Steps 1 through 7 at least 10 times every 1-2 hours when you are awake. Take your time and take a few normal breaths between deep breaths. The spirometer may include an indicator to  show your best effort. Use the indicator as a goal to work toward during each repetition. After each set of 10 deep breaths, practice coughing to be sure your lungs are clear. If you have an incision (the cut made at the time of surgery), support your incision when coughing by placing a pillow or rolled up towels firmly against it. Once you are able to get out of bed, walk around indoors and cough well. You may stop using the incentive spirometer when instructed by your caregiver.  RISKS AND COMPLICATIONS Take your time so you do not get dizzy or light-headed. If you are in pain, you may need to take or ask for pain medication before doing incentive spirometry. It is harder to take a deep breath if you are having pain. AFTER USE Rest and breathe slowly and easily. It can be helpful to keep track of a log of your progress. Your caregiver can provide you with a simple table to help with this. If you are using the spirometer at home, follow these instructions: SEEK MEDICAL CARE IF:  You are having difficultly using the spirometer. You have trouble using the spirometer as often as instructed. Your pain medication is not giving enough relief while using the spirometer. You develop fever of 100.5 F (38.1 C) or higher. SEEK IMMEDIATE MEDICAL CARE IF:  You cough up bloody sputum that had not been present before. You develop fever of 102 F (38.9 C) or greater. You develop worsening pain at or near the incision site. MAKE SURE YOU:  Understand these instructions. Will watch your condition. Will get help right away if you are not doing well or get worse. Document Released: 06/01/2006 Document Revised: 04/13/2011 Document Reviewed: 08/02/2006  WHAT IS A BLOOD TRANSFUSION? Blood Transfusion Information  A transfusion is the replacement of blood or some of its parts. Blood is made up of multiple cells which provide different functions. Red blood cells carry oxygen and are used for blood loss  replacement. White blood cells fight against infection. Platelets control bleeding. Plasma helps clot blood. Other blood products are available for specialized needs, such as hemophilia or other clotting disorders. BEFORE THE TRANSFUSION  Who gives blood for transfusions?  Healthy volunteers who are fully evaluated to make sure their blood is safe. This is blood bank blood. Transfusion therapy is the safest it has ever been in the practice of medicine. Before blood is taken from a donor, a complete history is taken to make sure that person has no history of diseases nor engages in risky social behavior (examples are intravenous drug use or sexual activity with multiple partners). The donor's travel history is screened to minimize risk of transmitting infections, such as malaria. The donated blood is tested for signs of infectious diseases, such as HIV  and hepatitis. The blood is then tested to be sure it is compatible with you in order to minimize the chance of a transfusion reaction. If you or a relative donates blood, this is often done in anticipation of surgery and is not appropriate for emergency situations. It takes many days to process the donated blood. RISKS AND COMPLICATIONS Although transfusion therapy is very safe and saves many lives, the main dangers of transfusion include:  Getting an infectious disease. Developing a transfusion reaction. This is an allergic reaction to something in the blood you were given. Every precaution is taken to prevent this. The decision to have a blood transfusion has been considered carefully by your caregiver before blood is given. Blood is not given unless the benefits outweigh the risks. AFTER THE TRANSFUSION Right after receiving a blood transfusion, you will usually feel much better and more energetic. This is especially true if your red blood cells have gotten low (anemic). The transfusion raises the level of the red blood cells which carry oxygen, and  this usually causes an energy increase. The nurse administering the transfusion will monitor you carefully for complications. HOME CARE INSTRUCTIONS  No special instructions are needed after a transfusion. You may find your energy is better. Speak with your caregiver about any limitations on activity for underlying diseases you may have. SEEK MEDICAL CARE IF:  Your condition is not improving after your transfusion. You develop redness or irritation at the intravenous (IV) site. SEEK IMMEDIATE MEDICAL CARE IF:  Any of the following symptoms occur over the next 12 hours: Shaking chills. You have a temperature by mouth above 102 F (38.9 C), not controlled by medicine. Chest, back, or muscle pain. People around you feel you are not acting correctly or are confused. Shortness of breath or difficulty breathing. Dizziness and fainting. You get a rash or develop hives. You have a decrease in urine output. Your urine turns a dark color or changes to pink, red, or brown. Any of the following symptoms occur over the next 10 days: You have a temperature by mouth above 102 F (38.9 C), not controlled by medicine. Shortness of breath. Weakness after normal activity. The white part of the eye turns yellow (jaundice). You have a decrease in the amount of urine or are urinating less often. Your urine turns a dark color or changes to pink, red, or brown. Document Released: 01/17/2000 Document Revised: 04/13/2011 Document Reviewed: 09/05/2007 [facility] Patient Information 2014 [place], [place].

## 2024-01-18 ENCOUNTER — Encounter (HOSPITAL_COMMUNITY): Payer: Self-pay

## 2024-01-18 ENCOUNTER — Other Ambulatory Visit: Payer: Self-pay

## 2024-01-18 ENCOUNTER — Encounter (HOSPITAL_COMMUNITY)
Admission: RE | Admit: 2024-01-18 | Discharge: 2024-01-18 | Attending: Orthopedic Surgery | Admitting: Orthopedic Surgery

## 2024-01-18 VITALS — BP 129/77 | HR 61 | Temp 97.9°F | Resp 16 | Ht 66.0 in | Wt 157.0 lb

## 2024-01-18 DIAGNOSIS — Z01818 Encounter for other preprocedural examination: Secondary | ICD-10-CM

## 2024-01-18 DIAGNOSIS — M25562 Pain in left knee: Secondary | ICD-10-CM | POA: Insufficient documentation

## 2024-01-18 DIAGNOSIS — G8929 Other chronic pain: Secondary | ICD-10-CM | POA: Diagnosis not present

## 2024-01-18 DIAGNOSIS — Z01812 Encounter for preprocedural laboratory examination: Secondary | ICD-10-CM | POA: Insufficient documentation

## 2024-01-18 DIAGNOSIS — Z96652 Presence of left artificial knee joint: Secondary | ICD-10-CM | POA: Insufficient documentation

## 2024-01-18 DIAGNOSIS — M7062 Trochanteric bursitis, left hip: Secondary | ICD-10-CM | POA: Diagnosis not present

## 2024-01-18 DIAGNOSIS — T8484XD Pain due to internal orthopedic prosthetic devices, implants and grafts, subsequent encounter: Secondary | ICD-10-CM | POA: Diagnosis not present

## 2024-01-18 LAB — CBC WITH DIFFERENTIAL/PLATELET
Abs Immature Granulocytes: 0.01 K/uL (ref 0.00–0.07)
Basophils Absolute: 0 K/uL (ref 0.0–0.1)
Basophils Relative: 0 %
Eosinophils Absolute: 0 K/uL (ref 0.0–0.5)
Eosinophils Relative: 0 %
HCT: 41.1 % (ref 36.0–46.0)
Hemoglobin: 13.6 g/dL (ref 12.0–15.0)
Immature Granulocytes: 0 %
Lymphocytes Relative: 34 %
Lymphs Abs: 1.6 K/uL (ref 0.7–4.0)
MCH: 29.4 pg (ref 26.0–34.0)
MCHC: 33.1 g/dL (ref 30.0–36.0)
MCV: 88.8 fL (ref 80.0–100.0)
Monocytes Absolute: 0.6 K/uL (ref 0.1–1.0)
Monocytes Relative: 12 %
Neutro Abs: 2.5 K/uL (ref 1.7–7.7)
Neutrophils Relative %: 54 %
Platelets: 279 K/uL (ref 150–400)
RBC: 4.63 MIL/uL (ref 3.87–5.11)
RDW: 13.8 % (ref 11.5–15.5)
WBC: 4.8 K/uL (ref 4.0–10.5)
nRBC: 0 % (ref 0.0–0.2)

## 2024-01-18 LAB — SURGICAL PCR SCREEN
MRSA, PCR: NEGATIVE
Staphylococcus aureus: NEGATIVE

## 2024-01-18 LAB — TYPE AND SCREEN
ABO/RH(D): B POS
Antibody Screen: NEGATIVE

## 2024-01-18 LAB — COMPREHENSIVE METABOLIC PANEL WITH GFR
ALT: 16 U/L (ref 0–44)
AST: 24 U/L (ref 15–41)
Albumin: 4.4 g/dL (ref 3.5–5.0)
Alkaline Phosphatase: 85 U/L (ref 38–126)
Anion gap: 9 (ref 5–15)
BUN: 13 mg/dL (ref 8–23)
CO2: 26 mmol/L (ref 22–32)
Calcium: 9.9 mg/dL (ref 8.9–10.3)
Chloride: 99 mmol/L (ref 98–111)
Creatinine, Ser: 0.58 mg/dL (ref 0.44–1.00)
GFR, Estimated: >60 mL/min (ref >60)
Glucose, Bld: 119 mg/dL — ABNORMAL HIGH (ref 70–99)
Potassium: 4.4 mmol/L (ref 3.5–5.1)
Sodium: 134 mmol/L — ABNORMAL LOW (ref 135–145)
Total Bilirubin: 0.4 mg/dL (ref 0.0–1.2)
Total Protein: 7.3 g/dL (ref 6.5–8.1)

## 2024-01-31 ENCOUNTER — Inpatient Hospital Stay (HOSPITAL_COMMUNITY)
Admission: RE | Admit: 2024-01-31 | Discharge: 2024-02-03 | Disposition: A | Discharge status: Home / Self Care | Source: Ambulatory Visit | Attending: Orthopedic Surgery | Admitting: Orthopedic Surgery

## 2024-01-31 ENCOUNTER — Inpatient Hospital Stay (HOSPITAL_COMMUNITY): Admitting: Anesthesiology

## 2024-01-31 ENCOUNTER — Inpatient Hospital Stay (HOSPITAL_COMMUNITY): Payer: Self-pay | Admitting: Medical

## 2024-01-31 ENCOUNTER — Other Ambulatory Visit: Payer: Self-pay

## 2024-01-31 ENCOUNTER — Encounter (HOSPITAL_COMMUNITY)
Admission: RE | Disposition: A | Discharge status: Home / Self Care | Payer: Self-pay | Source: Home / Self Care | Attending: Orthopedic Surgery

## 2024-01-31 ENCOUNTER — Inpatient Hospital Stay (HOSPITAL_COMMUNITY)

## 2024-01-31 ENCOUNTER — Encounter (HOSPITAL_COMMUNITY): Payer: Self-pay | Admitting: Orthopedic Surgery

## 2024-01-31 DIAGNOSIS — T84018A Broken internal joint prosthesis, other site, initial encounter: Principal | ICD-10-CM

## 2024-01-31 DIAGNOSIS — Z8261 Family history of arthritis: Secondary | ICD-10-CM

## 2024-01-31 DIAGNOSIS — Z9841 Cataract extraction status, right eye: Secondary | ICD-10-CM | POA: Diagnosis not present

## 2024-01-31 DIAGNOSIS — M24562 Contracture, left knee: Secondary | ICD-10-CM | POA: Diagnosis present

## 2024-01-31 DIAGNOSIS — Y792 Prosthetic and other implants, materials and accessory orthopedic devices associated with adverse incidents: Secondary | ICD-10-CM | POA: Diagnosis present

## 2024-01-31 DIAGNOSIS — Z9842 Cataract extraction status, left eye: Secondary | ICD-10-CM

## 2024-01-31 DIAGNOSIS — F32A Depression, unspecified: Secondary | ICD-10-CM | POA: Diagnosis present

## 2024-01-31 DIAGNOSIS — M25562 Pain in left knee: Secondary | ICD-10-CM | POA: Diagnosis present

## 2024-01-31 DIAGNOSIS — Z79890 Hormone replacement therapy (postmenopausal): Secondary | ICD-10-CM | POA: Diagnosis not present

## 2024-01-31 DIAGNOSIS — Z87891 Personal history of nicotine dependence: Secondary | ICD-10-CM | POA: Diagnosis not present

## 2024-01-31 DIAGNOSIS — M1990 Unspecified osteoarthritis, unspecified site: Secondary | ICD-10-CM | POA: Diagnosis present

## 2024-01-31 DIAGNOSIS — Z79899 Other long term (current) drug therapy: Secondary | ICD-10-CM | POA: Diagnosis not present

## 2024-01-31 DIAGNOSIS — T84023A Instability of internal left knee prosthesis, initial encounter: Secondary | ICD-10-CM | POA: Diagnosis present

## 2024-01-31 DIAGNOSIS — E039 Hypothyroidism, unspecified: Secondary | ICD-10-CM | POA: Diagnosis present

## 2024-01-31 DIAGNOSIS — Z8249 Family history of ischemic heart disease and other diseases of the circulatory system: Secondary | ICD-10-CM | POA: Diagnosis not present

## 2024-01-31 DIAGNOSIS — I10 Essential (primary) hypertension: Secondary | ICD-10-CM | POA: Diagnosis present

## 2024-01-31 DIAGNOSIS — Z96642 Presence of left artificial hip joint: Secondary | ICD-10-CM | POA: Diagnosis present

## 2024-01-31 HISTORY — PX: TOTAL KNEE REVISION: SHX996

## 2024-01-31 SURGERY — TOTAL KNEE REVISION
Anesthesia: Spinal | Site: Knee | Laterality: Left

## 2024-01-31 MED ORDER — LACTATED RINGERS IV SOLN: INTRAVENOUS | Status: DC

## 2024-01-31 MED ORDER — ONDANSETRON HCL 4 MG PO TABS: 4.0000 mg | ORAL_TABLET | Freq: Four times a day (QID) | ORAL | Status: DC | PRN

## 2024-01-31 MED ORDER — ISOPROPYL ALCOHOL 70 % SOLN
Status: AC
  Filled 2024-01-31: qty 480

## 2024-01-31 MED ORDER — PHENYLEPHRINE 80 MCG/ML (10ML) SYRINGE FOR IV PUSH (FOR BLOOD PRESSURE SUPPORT)
PREFILLED_SYRINGE | INTRAVENOUS | Status: AC
  Filled 2024-01-31: qty 10

## 2024-01-31 MED ORDER — SODIUM CHLORIDE (PF) 0.9 % IJ SOLN
INTRAMUSCULAR | Status: AC
  Filled 2024-01-31: qty 30

## 2024-01-31 MED ORDER — BUPIVACAINE IN DEXTROSE 0.75-8.25 % IT SOLN
INTRATHECAL | Status: DC | PRN
  Administered 2024-01-31: 12 mg via INTRATHECAL

## 2024-01-31 MED ORDER — ACETAMINOPHEN 500 MG PO TABS: 1000.0000 mg | ORAL_TABLET | Freq: Three times a day (TID) | ORAL | Status: AC | PRN

## 2024-01-31 MED ORDER — PHENYLEPHRINE 80 MCG/ML (10ML) SYRINGE FOR IV PUSH (FOR BLOOD PRESSURE SUPPORT)
PREFILLED_SYRINGE | INTRAVENOUS | Status: DC | PRN
  Administered 2024-01-31: 80 ug via INTRAVENOUS

## 2024-01-31 MED ORDER — DIPHENHYDRAMINE HCL 12.5 MG/5ML PO ELIX: 12.5000 mg | ORAL_SOLUTION | ORAL | Status: DC | PRN

## 2024-01-31 MED ORDER — SODIUM CHLORIDE (PF) 0.9 % IJ SOLN
INTRAMUSCULAR | Status: DC | PRN
  Administered 2024-01-31: 80 mL

## 2024-01-31 MED ORDER — FENTANYL CITRATE (PF) 100 MCG/2ML IJ SOLN
INTRAMUSCULAR | Status: AC
  Filled 2024-01-31: qty 2

## 2024-01-31 MED ORDER — HYDROMORPHONE HCL 1 MG/ML IJ SOLN
0.2500 mg | INTRAMUSCULAR | Status: DC | PRN
  Administered 2024-01-31 (×2): 0.5 mg via INTRAVENOUS

## 2024-01-31 MED ORDER — PHENOL 1.4 % MT LIQD: 1.0000 | OROMUCOSAL | Status: DC | PRN

## 2024-01-31 MED ORDER — BUPIVACAINE-EPINEPHRINE (PF) 0.25% -1:200000 IJ SOLN
INTRAMUSCULAR | Status: AC
  Filled 2024-01-31: qty 30

## 2024-01-31 MED ORDER — ONDANSETRON HCL 4 MG/2ML IJ SOLN: 4.0000 mg | Freq: Four times a day (QID) | INTRAMUSCULAR | Status: DC | PRN

## 2024-01-31 MED ORDER — DEXMEDETOMIDINE HCL IN NACL 80 MCG/20ML IV SOLN
INTRAVENOUS | Status: DC | PRN
  Administered 2024-01-31 (×2): 4 ug via INTRAVENOUS

## 2024-01-31 MED ORDER — MIDAZOLAM HCL (PF) 2 MG/2ML IJ SOLN
1.0000 mg | INTRAMUSCULAR | Status: DC
  Administered 2024-01-31: 2 mg via INTRAVENOUS
  Filled 2024-01-31: qty 2

## 2024-01-31 MED ORDER — SERTRALINE HCL 50 MG PO TABS
50.0000 mg | ORAL_TABLET | Freq: Every day | ORAL | Status: DC
  Administered 2024-02-01 – 2024-02-02 (×2): 50 mg via ORAL
  Filled 2024-01-31 (×2): qty 1

## 2024-01-31 MED ORDER — PROPOFOL 10 MG/ML IV BOLUS
INTRAVENOUS | Status: AC
  Filled 2024-01-31: qty 20

## 2024-01-31 MED ORDER — PANTOPRAZOLE SODIUM 40 MG PO TBEC
40.0000 mg | DELAYED_RELEASE_TABLET | Freq: Every day | ORAL | Status: DC
  Administered 2024-01-31 – 2024-02-03 (×4): 40 mg via ORAL
  Filled 2024-01-31 (×4): qty 1

## 2024-01-31 MED ORDER — POLYETHYLENE GLYCOL 3350 17 G PO PACK: 17.0000 g | PACK | Freq: Every day | ORAL | 0 refills | Status: DC

## 2024-01-31 MED ORDER — METHOCARBAMOL 500 MG PO TABS: 500.0000 mg | ORAL_TABLET | Freq: Three times a day (TID) | ORAL | 0 refills | Status: DC | PRN

## 2024-01-31 MED ORDER — OXYCODONE HCL 5 MG PO TABS: 5.0000 mg | ORAL_TABLET | ORAL | Status: DC | PRN

## 2024-01-31 MED ORDER — ZOLPIDEM TARTRATE 5 MG PO TABS: 5.0000 mg | ORAL_TABLET | Freq: Every evening | ORAL | Status: DC | PRN

## 2024-01-31 MED ORDER — DEXAMETHASONE SOD PHOSPHATE PF 10 MG/ML IJ SOLN
INTRAMUSCULAR | Status: DC | PRN
  Administered 2024-01-31: 10 mg via INTRAVENOUS

## 2024-01-31 MED ORDER — WATER FOR IRRIGATION, STERILE IR SOLN
Status: DC | PRN
  Administered 2024-01-31: 2000 mL

## 2024-01-31 MED ORDER — SODIUM CHLORIDE 0.9 % IR SOLN
Status: DC | PRN
  Administered 2024-01-31: 3000 mL

## 2024-01-31 MED ORDER — ONDANSETRON HCL 4 MG PO TABS: 4.0000 mg | ORAL_TABLET | Freq: Three times a day (TID) | ORAL | 0 refills | Status: DC | PRN

## 2024-01-31 MED ORDER — CHLORHEXIDINE GLUCONATE 0.12 % MT SOLN
15.0000 mL | Freq: Once | OROMUCOSAL | Status: AC
  Administered 2024-01-31: 15 mL via OROMUCOSAL

## 2024-01-31 MED ORDER — METHOCARBAMOL 500 MG PO TABS
ORAL_TABLET | ORAL | Status: AC
  Filled 2024-01-31: qty 1

## 2024-01-31 MED ORDER — CELECOXIB 100 MG PO CAPS: 100.0000 mg | ORAL_CAPSULE | Freq: Two times a day (BID) | ORAL | 0 refills | Status: DC

## 2024-01-31 MED ORDER — ROPIVACAINE HCL 7.5 MG/ML IJ SOLN
INTRAMUSCULAR | Status: DC | PRN
  Administered 2024-01-31: 20 mL via PERINEURAL

## 2024-01-31 MED ORDER — FENTANYL CITRATE (PF) 100 MCG/2ML IJ SOLN
INTRAMUSCULAR | Status: DC | PRN
  Administered 2024-01-31: 50 ug via INTRAVENOUS

## 2024-01-31 MED ORDER — METHOCARBAMOL 500 MG PO TABS
500.0000 mg | ORAL_TABLET | Freq: Four times a day (QID) | ORAL | Status: DC | PRN
  Administered 2024-01-31 – 2024-02-03 (×8): 500 mg via ORAL
  Filled 2024-01-31 (×7): qty 1

## 2024-01-31 MED ORDER — B COMPLEX VITAMINS PO CAPS: 1.0000 | ORAL_CAPSULE | Freq: Every day | ORAL | Status: DC

## 2024-01-31 MED ORDER — LIDOCAINE HCL (PF) 2 % IJ SOLN
INTRAMUSCULAR | Status: DC | PRN
  Administered 2024-01-31: 50 mg via INTRADERMAL

## 2024-01-31 MED ORDER — ACETAMINOPHEN 500 MG PO TABS
1000.0000 mg | ORAL_TABLET | Freq: Once | ORAL | Status: AC
  Administered 2024-01-31: 1000 mg via ORAL
  Filled 2024-01-31: qty 2

## 2024-01-31 MED ORDER — BUPIVACAINE LIPOSOME 1.3 % IJ SUSP: 20.0000 mL | Freq: Once | INTRAMUSCULAR | Status: DC

## 2024-01-31 MED ORDER — PROPOFOL 10 MG/ML IV BOLUS
INTRAVENOUS | Status: DC | PRN
  Administered 2024-01-31: 100 ug/kg/min via INTRAVENOUS
  Administered 2024-01-31: 50 mg via INTRAVENOUS

## 2024-01-31 MED ORDER — ISOPROPYL ALCOHOL 70 % SOLN
Status: DC | PRN
  Administered 2024-01-31: 1 via TOPICAL

## 2024-01-31 MED ORDER — HYDROCHLOROTHIAZIDE 12.5 MG PO TABS
12.5000 mg | ORAL_TABLET | Freq: Every day | ORAL | Status: DC
  Administered 2024-02-02 – 2024-02-03 (×2): 12.5 mg via ORAL
  Filled 2024-01-31 (×2): qty 1

## 2024-01-31 MED ORDER — HYDROMORPHONE HCL 1 MG/ML IJ SOLN
INTRAMUSCULAR | Status: AC
  Filled 2024-01-31: qty 1

## 2024-01-31 MED ORDER — CEFADROXIL 500 MG PO CAPS: 500.0000 mg | ORAL_CAPSULE | Freq: Two times a day (BID) | ORAL | 0 refills | Status: DC

## 2024-01-31 MED ORDER — LACTATED RINGERS IV SOLN: INTRAVENOUS | Status: DC | PRN

## 2024-01-31 MED ORDER — TRANEXAMIC ACID-NACL 1000-0.7 MG/100ML-% IV SOLN
INTRAVENOUS | Status: DC | PRN
  Administered 2024-01-31: 1000 mg via INTRAVENOUS

## 2024-01-31 MED ORDER — VITAMIN E 45 MG (100 UNIT) PO CAPS
400.0000 [IU] | ORAL_CAPSULE | Freq: Every day | ORAL | Status: DC
  Administered 2024-02-02 – 2024-02-03 (×2): 400 [IU] via ORAL
  Filled 2024-01-31 (×3): qty 4

## 2024-01-31 MED ORDER — LIDOCAINE HCL (PF) 2 % IJ SOLN
INTRAMUSCULAR | Status: AC
  Filled 2024-01-31: qty 5

## 2024-01-31 MED ORDER — BUPIVACAINE LIPOSOME 1.3 % IJ SUSP
INTRAMUSCULAR | Status: AC
  Filled 2024-01-31: qty 20

## 2024-01-31 MED ORDER — FENTANYL CITRATE (PF) 50 MCG/ML IJ SOSY
50.0000 ug | PREFILLED_SYRINGE | INTRAMUSCULAR | Status: DC
  Administered 2024-01-31: 50 ug via INTRAVENOUS
  Filled 2024-01-31: qty 1

## 2024-01-31 MED ORDER — DEXAMETHASONE SOD PHOSPHATE PF 10 MG/ML IJ SOLN: 8.0000 mg | Freq: Once | INTRAMUSCULAR | Status: DC

## 2024-01-31 MED ORDER — ONDANSETRON HCL 4 MG/2ML IJ SOLN
INTRAMUSCULAR | Status: AC
  Filled 2024-01-31: qty 2

## 2024-01-31 MED ORDER — PROPOFOL 1000 MG/100ML IV EMUL
INTRAVENOUS | Status: AC
  Filled 2024-01-31: qty 100

## 2024-01-31 MED ORDER — DOCUSATE SODIUM 100 MG PO CAPS
100.0000 mg | ORAL_CAPSULE | Freq: Two times a day (BID) | ORAL | Status: DC
  Administered 2024-01-31 – 2024-02-03 (×6): 100 mg via ORAL
  Filled 2024-01-31 (×6): qty 1

## 2024-01-31 MED ORDER — OMEPRAZOLE 40 MG PO CPDR: 40.0000 mg | DELAYED_RELEASE_CAPSULE | Freq: Every day | ORAL | 0 refills | Status: DC

## 2024-01-31 MED ORDER — ACETAMINOPHEN 500 MG PO TABS: 1000.0000 mg | ORAL_TABLET | Freq: Once | ORAL | Status: DC

## 2024-01-31 MED ORDER — ACETAMINOPHEN 325 MG PO TABS: 325.0000 mg | ORAL_TABLET | Freq: Four times a day (QID) | ORAL | Status: DC | PRN

## 2024-01-31 MED ORDER — OXYCODONE HCL 5 MG PO TABS: 5.0000 mg | ORAL_TABLET | ORAL | 0 refills | Status: DC | PRN

## 2024-01-31 MED ORDER — CEFADROXIL 500 MG PO CAPS: 500.0000 mg | ORAL_CAPSULE | Freq: Two times a day (BID) | ORAL | Status: DC

## 2024-01-31 MED ORDER — DEXMEDETOMIDINE HCL IN NACL 80 MCG/20ML IV SOLN
INTRAVENOUS | Status: AC
  Filled 2024-01-31: qty 20

## 2024-01-31 MED ORDER — MIDAZOLAM HCL (PF) 2 MG/2ML IJ SOLN: 0.5000 mg | Freq: Once | INTRAMUSCULAR | Status: DC | PRN

## 2024-01-31 MED ORDER — CEFAZOLIN SODIUM-DEXTROSE 2-4 GM/100ML-% IV SOLN
2.0000 g | INTRAVENOUS | Status: AC
  Administered 2024-01-31: 2 g via INTRAVENOUS
  Filled 2024-01-31: qty 100

## 2024-01-31 MED ORDER — LEVOTHYROXINE SODIUM 25 MCG PO TABS
25.0000 ug | ORAL_TABLET | Freq: Every day | ORAL | Status: DC
  Administered 2024-02-01 – 2024-02-03 (×3): 25 ug via ORAL
  Filled 2024-01-31 (×3): qty 1

## 2024-01-31 MED ORDER — POVIDONE-IODINE 10 % EX SWAB
2.0000 | Freq: Once | CUTANEOUS | Status: AC
  Administered 2024-01-31: 2 via TOPICAL

## 2024-01-31 MED ORDER — SODIUM CHLORIDE 0.9 % IV SOLN: INTRAVENOUS | Status: DC

## 2024-01-31 MED ORDER — OXYCODONE HCL 5 MG PO TABS
5.0000 mg | ORAL_TABLET | Freq: Once | ORAL | Status: AC | PRN
  Administered 2024-01-31: 5 mg via ORAL

## 2024-01-31 MED ORDER — OXYCODONE HCL 5 MG PO TABS
ORAL_TABLET | ORAL | Status: AC
  Filled 2024-01-31: qty 1

## 2024-01-31 MED ORDER — MENTHOL 3 MG MT LOZG: 1.0000 | LOZENGE | OROMUCOSAL | Status: DC | PRN

## 2024-01-31 MED ORDER — VITAMIN C 500 MG PO TABS
500.0000 mg | ORAL_TABLET | Freq: Every evening | ORAL | Status: DC
  Administered 2024-02-01 – 2024-02-02 (×2): 500 mg via ORAL
  Filled 2024-01-31 (×2): qty 1

## 2024-01-31 MED ORDER — ONDANSETRON HCL 4 MG/2ML IJ SOLN
INTRAMUSCULAR | Status: DC | PRN
  Administered 2024-01-31: 4 mg via INTRAVENOUS

## 2024-01-31 MED ORDER — CEFAZOLIN SODIUM-DEXTROSE 2-4 GM/100ML-% IV SOLN
2.0000 g | Freq: Three times a day (TID) | INTRAVENOUS | Status: DC
  Administered 2024-01-31 – 2024-02-03 (×8): 2 g via INTRAVENOUS
  Filled 2024-01-31 (×9): qty 100

## 2024-01-31 MED ORDER — ASPIRIN 81 MG PO CHEW
81.0000 mg | CHEWABLE_TABLET | Freq: Two times a day (BID) | ORAL | Status: DC
  Administered 2024-01-31 – 2024-02-03 (×6): 81 mg via ORAL
  Filled 2024-01-31 (×6): qty 1

## 2024-01-31 MED ORDER — 0.9 % SODIUM CHLORIDE (POUR BTL) OPTIME
TOPICAL | Status: DC | PRN
  Administered 2024-01-31: 1000 mL

## 2024-01-31 MED ORDER — LISINOPRIL-HYDROCHLOROTHIAZIDE 10-12.5 MG PO TABS: 1.0000 | ORAL_TABLET | Freq: Every day | ORAL | Status: DC

## 2024-01-31 MED ORDER — ASPIRIN 81 MG PO TBEC: 81.0000 mg | DELAYED_RELEASE_TABLET | Freq: Two times a day (BID) | ORAL | Status: AC

## 2024-01-31 MED ORDER — METHOCARBAMOL 1000 MG/10ML IJ SOLN: 500.0000 mg | Freq: Four times a day (QID) | INTRAMUSCULAR | Status: DC | PRN

## 2024-01-31 MED ORDER — LISINOPRIL 10 MG PO TABS
10.0000 mg | ORAL_TABLET | Freq: Every day | ORAL | Status: DC
  Administered 2024-02-02 – 2024-02-03 (×2): 10 mg via ORAL
  Filled 2024-01-31 (×2): qty 1

## 2024-01-31 MED ORDER — KETOROLAC TROMETHAMINE 15 MG/ML IJ SOLN
7.5000 mg | Freq: Four times a day (QID) | INTRAMUSCULAR | Status: AC
  Administered 2024-01-31 – 2024-02-01 (×4): 7.5 mg via INTRAVENOUS
  Filled 2024-01-31 (×4): qty 1

## 2024-01-31 MED ORDER — PHENYLEPHRINE HCL (PRESSORS) 10 MG/ML IV SOLN
INTRAVENOUS | Status: AC
  Filled 2024-01-31: qty 1

## 2024-01-31 MED ORDER — ACETAMINOPHEN 500 MG PO TABS
1000.0000 mg | ORAL_TABLET | Freq: Four times a day (QID) | ORAL | Status: DC
  Administered 2024-02-01 (×2): 1000 mg via ORAL
  Filled 2024-01-31 (×2): qty 2

## 2024-01-31 MED ORDER — B COMPLEX-C PO TABS
1.0000 | ORAL_TABLET | Freq: Every day | ORAL | Status: DC
  Administered 2024-02-01 – 2024-02-03 (×3): 1 via ORAL
  Filled 2024-01-31 (×3): qty 1

## 2024-01-31 MED ORDER — ORAL CARE MOUTH RINSE: 15.0000 mL | Freq: Once | OROMUCOSAL | Status: AC

## 2024-01-31 MED ORDER — HYDROMORPHONE HCL 1 MG/ML IJ SOLN
0.5000 mg | INTRAMUSCULAR | Status: DC | PRN
  Administered 2024-02-01 – 2024-02-02 (×4): 1 mg via INTRAVENOUS
  Filled 2024-01-31 (×4): qty 1

## 2024-01-31 MED ORDER — POLYETHYLENE GLYCOL 3350 17 G PO PACK: 17.0000 g | PACK | Freq: Every day | ORAL | Status: DC | PRN

## 2024-01-31 MED ORDER — MEPERIDINE HCL 25 MG/ML IJ SOLN: 6.2500 mg | INTRAMUSCULAR | Status: DC | PRN

## 2024-01-31 MED ORDER — OXYCODONE HCL 5 MG/5ML PO SOLN: 5.0000 mg | Freq: Once | ORAL | Status: AC | PRN

## 2024-01-31 MED ORDER — TRANEXAMIC ACID-NACL 1000-0.7 MG/100ML-% IV SOLN
1000.0000 mg | INTRAVENOUS | Status: DC
  Filled 2024-01-31: qty 100

## 2024-01-31 SURGICAL SUPPLY — 64 items
AUGMENT HALF BLOCK PS EF 5 LM (Joint) IMPLANT
AUGMENT HALF BLOCK PS TIB EF5L (Joint) IMPLANT
BAG COUNTER SPONGE SURGICOUNT (BAG) IMPLANT
BLADE OSTEOTOME FLAT 10X5 (BLADE) IMPLANT
BLADE OSTEOTOME FLAT 6X9 (BLADE) IMPLANT
BLADE OSTEOTOME FLAT 8X11 (BLADE) IMPLANT
BLADE SAG 18X100X1.27 (BLADE) ×1 IMPLANT
BLADE SAW SAG 35X64 .89 (BLADE) ×1 IMPLANT
BLADE SAW SGTL 11.0X1.19X90.0M (BLADE) IMPLANT
BLADE SAW SGTL 81X20 HD (BLADE) IMPLANT
BNDG COHESIVE 4X5 TAN STRL LF (GAUZE/BANDAGES/DRESSINGS) ×1 IMPLANT
BNDG ELASTIC 6X10 VLCR STRL LF (GAUZE/BANDAGES/DRESSINGS) ×1 IMPLANT
BOWL SMART MIX CTS (DISPOSABLE) IMPLANT
CANISTER WOUND CARE 500ML ATS (WOUND CARE) ×1 IMPLANT
CEMENT BONE R 1X40 (Cement) IMPLANT
CHLORAPREP W/TINT 26 (MISCELLANEOUS) ×2 IMPLANT
CNTNR URN SCR LID CUP LEK RST (MISCELLANEOUS) IMPLANT
COMPONENT TIB KNEE SZ E UNC (Knees) IMPLANT
COVER SURGICAL LIGHT HANDLE (MISCELLANEOUS) ×1 IMPLANT
CUFF TRNQT CYL 34X4.125X (TOURNIQUET CUFF) ×1 IMPLANT
DRAPE INCISE IOBAN 85X60 (DRAPES) ×1 IMPLANT
DRAPE SHEET LG 3/4 BI-LAMINATE (DRAPES) ×1 IMPLANT
DRAPE U-SHAPE 47X51 STRL (DRAPES) ×1 IMPLANT
DRESSING PEEL AND PLAC PRVNA20 (GAUZE/BANDAGES/DRESSINGS) ×1 IMPLANT
ELECT REM PT RETURN 15FT ADLT (MISCELLANEOUS) ×1 IMPLANT
GAUZE SPONGE 4X4 12PLY STRL (GAUZE/BANDAGES/DRESSINGS) ×1 IMPLANT
GLOVE BIO SURGEON STRL SZ 6.5 (GLOVE) ×2 IMPLANT
GLOVE BIOGEL PI IND STRL 6.5 (GLOVE) ×1 IMPLANT
GLOVE BIOGEL PI IND STRL 8 (GLOVE) ×1 IMPLANT
GLOVE SURG ORTHO 8.0 STRL STRW (GLOVE) ×2 IMPLANT
GOWN STRL REUS W/ TWL XL LVL3 (GOWN DISPOSABLE) ×2 IMPLANT
HOLDER FOLEY CATH W/STRAP (MISCELLANEOUS) IMPLANT
HOOD PEEL AWAY T7 (MISCELLANEOUS) ×3 IMPLANT
INSTRUMENT SCRW HEX REV 3.5X48 (ORTHOPEDIC DISPOSABLE SUPPLIES) IMPLANT
KIT DRSG PREVENA PLUS 7DAY 125 (MISCELLANEOUS) ×1 IMPLANT
KIT TURNOVER KIT A (KITS) ×1 IMPLANT
LINER TIB KNEE PS EF/6-9 12 LT (Liner) IMPLANT
MANIFOLD NEPTUNE II (INSTRUMENTS) ×1 IMPLANT
MARKER SKIN DUAL TIP RULER LAB (MISCELLANEOUS) ×1 IMPLANT
NS IRRIG 1000ML POUR BTL (IV SOLUTION) ×1 IMPLANT
PACK TOTAL KNEE CUSTOM (KITS) ×1 IMPLANT
PENCIL SMOKE EVACUATOR (MISCELLANEOUS) ×1 IMPLANT
PIN DRILL HDLS TROCAR 75 4PK (PIN) IMPLANT
PROTECTOR NERVE ULNAR (MISCELLANEOUS) ×1 IMPLANT
SCREW HEX HEADED 3.5X27 DISP (ORTHOPEDIC DISPOSABLE SUPPLIES) IMPLANT
SET HNDPC FAN SPRY TIP SCT (DISPOSABLE) ×1 IMPLANT
SOLUTION IRRIG SURGIPHOR (IV SOLUTION) IMPLANT
SOLUTION PRONTOSAN WOUND 350ML (IRRIGATION / IRRIGATOR) IMPLANT
SPIKE FLUID TRANSFER (MISCELLANEOUS) ×1 IMPLANT
STEM EXT REV 3 18X135 (Stem) IMPLANT
STEM FEM CMT CCR PLS SZ 7 L (Stem) IMPLANT
STEM FEM OFFSET 135X16X3 (Stem) IMPLANT
SUT ETHILON 3 0 PS 1 (SUTURE) ×4 IMPLANT
SUT STRATAFIX 14 PDO 48 VLT (SUTURE) ×1 IMPLANT
SUT VIC AB 0 CT1 36 (SUTURE) ×1 IMPLANT
SUT VIC AB 1 CT1 36 (SUTURE) IMPLANT
SUT VIC AB 2-0 CT2 27 (SUTURE) ×2 IMPLANT
SUTURE STRATFX 0 PDS 27 VIOLET (SUTURE) ×1 IMPLANT
SYR 50ML LL SCALE MARK (SYRINGE) ×1 IMPLANT
TRAY FOLEY MTR SLVR 14FR STAT (SET/KITS/TRAYS/PACK) IMPLANT
TRAY FOLEY MTR SLVR 16FR STAT (SET/KITS/TRAYS/PACK) ×1 IMPLANT
TUBE SUCTION HIGH CAP CLEAR NV (SUCTIONS) ×1 IMPLANT
UNDERPAD 30X36 HEAVY ABSORB (UNDERPADS AND DIAPERS) ×1 IMPLANT
WEDGE FEM F/ARTHRO SMALL (Miscellaneous) IMPLANT

## 2024-01-31 NOTE — Op Note (Signed)
 DATE OF SURGERY:  01/31/2024 TIME: 7:17 PM  PATIENT NAME:  Allison Harris   AGE: 78 y.o.    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  Application of Prevena incisional wound VAC 20 x 3 cm  SURGEON:  Laveta Gilkey A Carrington Mullenax, MD   ASSISTANT: Bernarda Mclean, PA-C, present and scrubbed throughout the case, critical for assistance with exposure, retraction, instrumentation, and closure.   OPERATIVE IMPLANTS:  Zimmer persona revision left size E tibial baseplate with 16 mm x press-fit splined stem with 3 mm offset, small trabecular metal tibial central cone, revision cemented persona femur size 57+ left, 18 mm x 135 mm femoral splined stem with 3 mm offset, , 12 mm CPS poly insert  Implant Name Type Inv. Item Serial No. Manufacturer Lot No. LRB No. Used Action  ATTUNE PSRP INSR SZ6 8 KNEE - [id number] Insert ATTUNE PSRP INSR SZ6 8 KNEE  [employer] [id number] Left 1 Explanted  CEMENT BONE R 1X40 - [id number] Cement CEMENT BONE R 1X40  ZIMMER RECON(ORTH,TRAU,BIO,SG) [id number] Left 2 Implanted  COMPONENT TIB KNEE SZ E UNC - [id number] Knees COMPONENT TIB KNEE SZ E UNC  ZIMMER RECON(ORTH,TRAU,BIO,SG) [id number] Left 1 Implanted  STEM FEM OFFSET [id number] - [id number] Stem STEM FEM OFFSET [id number]  ZIMMER RECON(ORTH,TRAU,BIO,SG) [id number] Left 1 Implanted  AUGMENT HALF BLOCK PS EF 5 LM - [id number] Joint AUGMENT HALF BLOCK PS EF 5 LM  ZIMMER RECON(ORTH,TRAU,BIO,SG) [id number] Left 1 Implanted  AUGMENT HALF BLOCK PS TIB EF5L - [id number] Joint AUGMENT HALF BLOCK PS TIB EF5L  ZIMMER RECON(ORTH,TRAU,BIO,SG) [id number] Left 1 Implanted  STEM FEM CMT CCR PLS SZ 7 L - [id number] Stem STEM FEM CMT CCR PLS SZ 7 L  ZIMMER RECON(ORTH,TRAU,BIO,SG) [id number] Left 1 Implanted  STEM EXT REV 3 18X135 - [id number] Stem STEM EXT REV 3 18X135  ZIMMER RECON(ORTH,TRAU,BIO,SG) [id number] Left 1 Implanted   WEDGE FEM F/ARTHRO SMALL - [id number] Miscellaneous WEDGE FEM F/ARTHRO SMALL  ZIMMER RECON(ORTH,TRAU,BIO,SG) [id number] Left 1 Implanted  LINER TIB KNEE PS EF/6-9 12 LT - [id number] Liner LINER TIB KNEE PS EF/6-9 12 LT  ZIMMER RECON(ORTH,TRAU,BIO,SG) [id number] Left 1 Implanted      PREOPERATIVE INDICATIONS: Allison Harris is a 78 y.o. year old female who had undergone left total knee arthroplasty originally in March 2023.  She had issues with instability after surgery.  She underwent a liner exchange in March of this year.  Unfortunately continued to have issues with pain in the left knee.  Specifically she had difficulty achieving full extension but still had relative laxity in flexion.  Ultimately elected for both component revision tibia and femur to correct the imbalance in the knee.  The risks, benefits, and alternatives were discussed at length including but not limited to the risks of infection, bleeding, nerve injury, stiffness, blood clots, the need for revision surgery, cardiopulmonary complications, among others, and they were willing to proceed.  OPERATIVE FINDINGS AND UNIQUE ASPECTS OF THE CASE: Corrected the instability and extension by raising the joint line and used a plus femur to close down the flexion space.  Used a CPS insert for varus and valgus stability.  ESTIMATED BLOOD LOSS: 100cc  OPERATIVE DESCRIPTION: Once adequate anesthesia, preoperative antibiotics, 2 gm of Ancef ,1 gm of Tranexamic Acid ; the patient was positioned supine with a thigh tourniquet placed.  The left lower extremity was prepped and draped in sterile fashion.  A time-  out was performed  identifying the patient, planned procedure, and the appropriate extremity.     The leg was  exsanguinated, tourniquet elevated to 250 mmHg.  A midline incision was made utilizing his old scar.  A medial parapatellar arthrotomy was performed.  Small serous effusion was appreciated.  Moderate synovitis throughout the knee.  No  evidence of purulence or infection.  A circumferential synovectomy was performed.  3 synovial tissue specimens were sent from the infrapatellar, lateral, medial gutters.  These were sent for aerobic anaerobic cultures.  We next performed a large medial release off the tibial plateau with Bovie cautery, to the posterior medial aspect of the tibia..   Suprapatellar fat pad was resected.  Soft tissue at the bone implant interface was resected.  Notably in the there was appreciable debonding of the anterior and posterior aspects of the femoral component with still some fixation distally..  Using a small osteotome of the polyethylene liner was removed.  No significant wear or damage of the polyethylene liner was appreciated.   We next turned our attention to explanting the femoral and tibial components.  The peripheral edge was exposed circumferentially around the femoral component using Bovie cautery and a rongeur.  Next using quarter inch straight osteotome and a small ACL blade saw.  We were able to break up the implant cement interface circumferentially.  Next using the shukla extractor and mallet we were able to easily remove the femoral component.  Minimal bone loss was noted.    Next we turned our attention to explanting the tibial component.  The tibia was also exposed circumferentially and using a ACL saw blade and quarter inch osteotome we were able to break up the implant cement interface.  The tibial component was noted to be loose as it started to mobilize with minimal manipulation of the tibial component.  The tibial component was able to be extracted also with minimal bone loss.    We next turned our attention to preparing the femoral and and tibial canal for a 135 mm press-fit stem.  We sequentially reamed up to 18 mm on the femur and 16mm on the tibia which had good cortical fit..  We performed a freshen up cut of the distal femur and had good distal bone through the 5 mm cut both medial lateral  and initially elected for 5 mm augments on both sides.     Next we turned our attention to tibial preparation.  We then performed a freshen up cut of the proximal tibia at 0 degrees..  We then sized the tibial component and relative to the tibial canal the baseplate sat relatively posterior medial.  We used a 3 mm offset and were able to bring the baseplate anterior and lateral.  A size E tibial tray allowed us  to larger size without overhang although was still somewhat uncovered anterior lateral given once appropriately externally rotated.  Overall we had good tibial bone following our proximal tibia recut, and had minimal tibial metaphyseal bone loss but for additional fixation elected to prep for a cone.   We broached sequentially up to a size small cone which had good fit and rotational stability.    Next we turned our attention to femoral sizing.  We felt a size 7 femoral component was most appropriate and match the size of the prior implant.  The rotation of his prior implant does seemed to match the transepicondylar access well, and the patient had good preoperative tracking.  Using a 3mm offset stem we we  translated this femoral component posterior and medial.  Next we carefully using Bovie cautery and cobb and did a posterior capsule release to help improve extension.  Trial components were both inserted on the femur and tibia.  With the initial trial components I felt that the knee remained tight in extension compared to flexion and with a 10 mm insert.  Elected to remove the distal femoral augments to raise the joint line.  Then reinserted trial components with a plus size femur.  Also felt that the lateral side was relatively tight compared to the medial side in both extension and flexion.  First we performed a pie crusting of the IT band which seemed to improve the balance and extension once we upsized to a 12 mm insert.  However still being tied in flexion also did pie crusting of the popliteus  which helped open up the lateral space and flexion. With a 12 mm CPS poly we had full extension and good stability with varus valgus stress with about 2mm opening medially and 2 mm opening laterally.  And good stability in flexion as well with about 3 to 4 mm anterior to posterior translation.   The tourniquet was dropped with approximately 100 minutes.  We then prepared the real components on the back table.   The tourniquet was reinflated for cementing.The tibial and femoral components were cemented using antibiotic cement.  Felt we had good press-fit with our stems. The knee was irrigated with dilute Betadine  irrigation and normal saline pulse lavage.  The synovial lining was  then injected a dilute Exparel .  We again checked stability and felt the 12mm insert was appropriate for additional stability elected to use a CPS poly.  The real polyethylene liner was then opened and inserted.  At this point we were pleased with our flexion extension gaps and range of motion, and patella tracking was appropriate.         The tourniquet was again let down.  No significant  hemostasis was required.  The medial parapatellar arthrotomy was then reapproximated using #1 Vicryl and #1 Stratafix sutures with the knee in 45 degrees of flexion.  The  remaining wound was closed with 2-0 Vicryl, and interrupted 3-0 Nylon  The knee was cleaned, dried, dressed with a 20x3cm Prevena incisional wound VAC.  The patient was then  brought to recovery room in stable condition, tolerating the procedure  well. There were no complications.     Post op recs: WB: WBAT Abx: ancef  x23 hours post op, followed by 7 days of cefadroxil  500 twice daily Imaging: PACU xrays DVT prophylaxis: Aspirin  81mg  BID x4 weeks Follow up: 1 week after surgery for a wound check with Dr. Edna at [facility].  Address: [address], [place], [place] [address]  Office Phone: [phone]   Toribio Edna,  MD Orthopaedic Surgery

## 2024-01-31 NOTE — Discharge Instructions (Signed)
 INSTRUCTIONS AFTER JOINT REPLACEMENT   Remove items at home which could result in a fall. This includes throw rugs or furniture in walking pathways ICE to the affected joint every three hours while awake for 30 minutes at a time, for at least the first 3-5 days, and then as needed for pain and swelling.  Continue to use ice for pain and swelling. You may notice swelling that will progress down to the foot and ankle.  This is normal after surgery.  Elevate your leg when you are not up walking on it.   Continue to use the breathing machine you got in the hospital (incentive spirometer) which will help keep your temperature down.  It is common for your temperature to cycle up and down following surgery, especially at night when you are not up moving around and exerting yourself.  The breathing machine keeps your lungs expanded and your temperature down.  DIET:  As you were doing prior to hospitalization, we recommend a well-balanced diet.  DRESSING / WOUND CARE / SHOWERING:  Keep the surgical dressing until follow up.  This has battery powered suction to be kept at 125 mmHg and lasts 7 days.  If the battery stops sooner than 7 days, then call our office for further instructions.    The dressing is also water resistant, however it is best to shower with an extra covering, such as saran wrap to keep it dry.  IF THE DRESSING FALLS OFF or the wound gets wet inside, change the dressing with sterile gauze and call our office for further instruction.  Please use good hand washing techniques before changing the dressing.  Do not use any lotions or creams on the incision until instructed by your surgeon.     ACTIVITY  Increase activity slowly as tolerated, but follow the weight bearing instructions below.   No driving for 6 weeks or until further direction given by your physician.  You cannot drive while taking narcotics.  No lifting or carrying greater than 10 lbs. until further directed by your  surgeon. Avoid periods of inactivity such as sitting longer than an hour when not asleep. This helps prevent blood clots.  You may return to work once you are authorized by your doctor.   WEIGHT BEARING: Weight bearing as tolerated with assist device (walker, cane, etc) as directed, use it as long as suggested by your surgeon or therapist, typically at least 4-6 weeks.  EXERCISES  Results after joint replacement surgery are often greatly improved when you follow the exercise, range of motion and muscle strengthening exercises prescribed by your doctor. Safety measures are also important to protect the joint from further injury. Any time any of these exercises cause you to have increased pain or swelling, decrease what you are doing until you are comfortable again and then slowly increase them. If you have problems or questions, call your caregiver or physical therapist for advice.   Rehabilitation is important following a joint replacement. After just a few days of immobilization, the muscles of the leg can become weakened and shrink (atrophy).  These exercises are designed to build up the tone and strength of the thigh and leg muscles and to improve motion. Often times heat used for twenty to thirty minutes before working out will loosen up your tissues and help with improving the range of motion but do not use heat for the first two weeks following surgery (sometimes heat can increase post-operative swelling).   These exercises can be done  on a training (exercise) mat, on the floor, on a table or on a bed. Use whatever works the best and is most comfortable for you.    Use music or television while you are exercising so that the exercises are a pleasant break in your day. This will make your life better with the exercises acting as a break in your routine that you can look forward to.   Perform all exercises about fifteen times, three times per day or as directed.  You should exercise both the  operative leg and the other leg as well.  Exercises include:   Quad Sets - Tighten up the muscle on the front of the thigh (Quad) and hold for 5-10 seconds.   Straight Leg Raises - With your knee straight (if you were given a brace, keep it on), lift the leg to 60 degrees, hold for 3 seconds, and slowly lower the leg.  Perform this exercise against resistance later as your leg gets stronger.  Leg Slides: Lying on your back, slowly slide your foot toward your buttocks, bending your knee up off the floor (only go as far as is comfortable). Then slowly slide your foot back down until your leg is flat on the floor again.  Angel Wings: Lying on your back spread your legs to the side as far apart as you can without causing discomfort.  Hamstring Strength:  Lying on your back, push your heel against the floor with your leg straight by tightening up the muscles of your buttocks.  Repeat, but this time bend your knee to a comfortable angle, and push your heel against the floor.  You may put a pillow under the heel to make it more comfortable if necessary.   A rehabilitation program following joint replacement surgery can speed recovery and prevent re-injury in the future due to weakened muscles. Contact your doctor or a physical therapist for more information on knee rehabilitation.   CONSTIPATION:  Constipation is defined medically as fewer than three stools per week and severe constipation as less than one stool per week.  Even if you have a regular bowel pattern at home, your normal regimen is likely to be disrupted due to multiple reasons following surgery.  Combination of anesthesia, postoperative narcotics, change in appetite and fluid intake all can affect your bowels.   YOU MUST use at least one of the following options; they are listed in order of increasing strength to get the job done.  They are all available over the counter, and you may need to use some, POSSIBLY even all of these options:     Drink plenty of fluids (prune juice may be helpful) and high fiber foods Colace 100 mg by mouth twice a day  Senokot for constipation as directed and as needed Dulcolax (bisacodyl ), take with full glass of water  Miralax (polyethylene glycol) once or twice a day as needed.  If you have tried all these things and are unable to have a bowel movement in the first 3-4 days after surgery call either your surgeon or your primary doctor.    If you experience loose stools or diarrhea, hold the medications until you stool forms back up.  If your symptoms do not get better within 1 week or if they get worse, check with your doctor.  If you experience the worst abdominal pain ever or develop nausea or vomiting, please contact the office immediately for further recommendations for treatment.  ITCHING:  If you experience itching with  your medications, try taking only a single pain pill, or even half a pain pill at a time.  You can also use Benadryl  over the counter for itching or also to help with sleep.   TED HOSE STOCKINGS:  Use stockings on both legs until for at least 2 weeks or as directed by physician office. They may be removed at night for sleeping.  MEDICATIONS:  See your medication summary on the "After Visit Summary" that nursing will review with you.  You may have some home medications which will be placed on hold until you complete the course of blood thinner medication.  It is important for you to complete the blood thinner medication as prescribed.  Blood clot prevention (DVT Prophylaxis): After surgery you are at an increased risk for a blood clot.  You were prescribed a blood thinner, Aspirin 81mg , to be taken twice daily for a total of 4 weeks from surgery to help reduce your risk of getting a blood clot.  Signs of a pulmonary embolus (blood clot in the lungs) include sudden short of breath, feeling lightheaded or dizzy, chest pain with a deep breath, rapid pulse rapid breathing.  Signs of  a blood clot in your arms or legs include new unexplained swelling and cramping, warm, red or darkened skin around the painful area.  Please call the office or 911 right away if these signs or symptoms develop.  PRECAUTIONS:   If you experience chest pain or shortness of breath - call 911 immediately for transfer to the hospital emergency department.   If you develop a fever greater that 101 F, purulent drainage from wound, increased redness or drainage from wound, foul odor from the wound/dressing, or calf pain - CONTACT YOUR SURGEON.                                                   FOLLOW-UP APPOINTMENTS:  If you do not already have a post-op appointment, please call the office for an appointment to be seen by your surgeon.  Guidelines for how soon to be seen are listed in your "After Visit Summary", but are typically between 2-3 weeks after surgery.  If you have a specialized bandage, you may be told to follow up 1 week after surgery.  OTHER INSTRUCTIONS:  Knee Replacement:  Do not place pillow under knee, focus on keeping the knee straight while resting.  Place foam block, curve side up under heel at all times except when walking.  DO NOT modify, tear, cut, or change the foam block in any way.  POST-OPERATIVE OPIOID TAPER INSTRUCTIONS: It is important to wean off of your opioid medication as soon as possible. If you do not need pain medication after your surgery it is ok to stop day one. Opioids include: Codeine, Hydrocodone(Norco, Vicodin), Oxycodone (Percocet, oxycontin ) and hydromorphone  amongst others.  Long term and even short term use of opiods can cause: Increased pain response Dependence Constipation Depression Respiratory depression And more.  Withdrawal symptoms can include Flu like symptoms Nausea, vomiting And more Techniques to manage these symptoms Hydrate well Eat regular healthy meals Stay active Use relaxation techniques(deep breathing, meditating, yoga) Do Not  substitute Alcohol to help with tapering If you have been on opioids for less than two weeks and do not have pain than it is ok to stop all together.  Plan  to wean off of opioids This plan should start within one week post op of your joint replacement. Maintain the same interval or time between taking each dose and first decrease the dose.  Cut the total daily intake of opioids by one tablet each day Next start to increase the time between doses. The last dose that should be eliminated is the evening dose.   MAKE SURE YOU:  Understand these instructions.  Get help right away if you are not doing well or get worse.    Thank you for letting us  be a part of your medical care team.  It is a privilege we respect greatly.  We hope these instructions will help you stay on track for a fast and full recovery!

## 2024-01-31 NOTE — Transfer of Care (Signed)
 Immediate Anesthesia Transfer of Care Note  Patient: Allison Harris  Procedure(s) Performed: TOTAL KNEE REVISION (Left: Knee)  Patient Location: PACU  Anesthesia Type:MAC, Regional, and Spinal  Level of Consciousness: awake, alert , and oriented  Airway & Oxygen Therapy: Patient Spontanous Breathing and Patient connected to nasal cannula oxygen  Post-op Assessment: Report given to RN and Post -op Vital signs reviewed and stable  Post vital signs: Reviewed and stable  Last Vitals:  Vitals Value Taken Time  BP 122/82 01/31/24 16:42  Temp    Pulse 65 01/31/24 16:43  Resp 20 01/31/24 16:43  SpO2 97 % 01/31/24 16:43  Vitals shown include unfiled device data.  Last Pain:  Vitals:   01/31/24 1245  TempSrc:   PainSc: 0-No pain      Patients Stated Pain Goal: 4 (01/31/24 1046)  Complications: No notable events documented.

## 2024-01-31 NOTE — Anesthesia Postprocedure Evaluation (Signed)
"   Anesthesia Post Note  Patient: Allison Harris  Procedure(s) Performed: TOTAL KNEE REVISION (Left: Knee)     Patient location during evaluation: PACU Anesthesia Type: Spinal Level of consciousness: oriented, awake and alert and patient cooperative Pain management: pain level controlled Vital Signs Assessment: post-procedure vital signs reviewed and stable Respiratory status: spontaneous breathing, respiratory function stable and nonlabored ventilation Cardiovascular status: blood pressure returned to baseline and stable Postop Assessment: no headache, no backache, no apparent nausea or vomiting and spinal receding Anesthetic complications: no   No notable events documented.  Last Vitals:  Vitals:   01/31/24 1745 01/31/24 1800  BP: (!) 143/72 134/75  Pulse: 62 63  Resp: 11 11  Temp:    SpO2: 94% 98%    Last Pain:  Vitals:   01/31/24 1800  TempSrc:   PainSc: Asleep                 Mylo Driskill,E. Eola Waldrep      "

## 2024-01-31 NOTE — Anesthesia Procedure Notes (Signed)
 Spinal  End time: 01/31/2024 1:14 PM  Staffing Performed: anesthesiologist  Authorized by: Allison Athens, MD   Performed by: Allison Athens, MD  Preanesthetic Checklist Completed: patient identified, IV checked, site marked, risks and benefits discussed, surgical consent, monitors and equipment checked, pre-op evaluation and timeout performed Spinal Block Patient position: sitting Prep: DuraPrep and site prepped and draped Patient monitoring: blood pressure, continuous pulse ox, cardiac monitor and heart rate Approach: midline Location: L3-4 Injection technique: single-shot Needle Needle type: Pencan and Introducer  Needle gauge: 24 G Needle length: 9 cm Assessment Events: CSF return  Additional Notes Pt identified in Operating room.  Monitors applied. Working IV access confirmed. Sterile prep, drape lumbar spine.  1% lido local L 3,4.  #24ga Pencan into clear CSF L 3,4.  12mg  0.75% Bupivacaine  with dextrose  injected with asp CSF beginning and end of injection.  Patient asymptomatic, VSS, no heme aspirated, tolerated well.  Allison Harris Leonce, MD

## 2024-01-31 NOTE — Anesthesia Preprocedure Evaluation (Addendum)
"                                    Anesthesia Evaluation  Patient identified by MRN, date of birth, ID band Patient awake    Reviewed: Allergy & Precautions, NPO status , Patient's Chart, lab work & pertinent test results  History of Anesthesia Complications Negative for: history of anesthetic complications  Airway Mallampati: II  TM Distance: >3 FB Neck ROM: Full    Dental  (+) Chipped, Dental Advisory Given   Pulmonary former smoker   breath sounds clear to auscultation       Cardiovascular hypertension, Pt. on medications (-) angina  Rhythm:Regular Rate:Normal     Neuro/Psych negative neurological ROS     GI/Hepatic Neg liver ROS,GERD  Medicated and Controlled,,  Endo/Other  Hypothyroidism    Renal/GU negative Renal ROS     Musculoskeletal   Abdominal   Peds  Hematology Hb 13.6, plt 279k   Anesthesia Other Findings   Reproductive/Obstetrics                              Anesthesia Physical Anesthesia Plan  ASA: 2  Anesthesia Plan: Spinal   Post-op Pain Management: Regional block* and Tylenol  PO (pre-op)*   Induction:   PONV Risk Score and Plan: 2 and Treatment may vary due to age or medical condition  Airway Management Planned: Natural Airway and Simple Face Mask  Additional Equipment: None  Intra-op Plan:   Post-operative Plan:   Informed Consent: I have reviewed the patients History and Physical, chart, labs and discussed the procedure including the risks, benefits and alternatives for the proposed anesthesia with the patient or authorized representative who has indicated his/her understanding and acceptance.     Dental advisory given  Plan Discussed with: CRNA and Surgeon  Anesthesia Plan Comments: (Plan routine monitors, SAB with adductor canal block for post op analgesia)         Anesthesia Quick Evaluation  "

## 2024-01-31 NOTE — Anesthesia Procedure Notes (Signed)
 Anesthesia Regional Block: Adductor canal block   Pre-Anesthetic Checklist: , timeout performed,  Correct Patient, Correct Site, Correct Laterality,  Correct Procedure, Correct Position, site marked,  Risks and benefits discussed,  Surgical consent,  Pre-op evaluation,  At surgeon's request and post-op pain management  Laterality: Left and Lower  Prep: chloraprep       Needles:  Injection technique: Single-shot  Needle Type: Echogenic Needle     Needle Length: 9cm  Needle Gauge: 21     Additional Needles:   Procedures:,,,, ultrasound used (permanent image in chart),,    Narrative:  Start time: 01/31/2024 12:12 PM End time: 01/31/2024 12:18 PM Injection made incrementally with aspirations every 5 mL.  Performed by: Personally  Anesthesiologist: Leonce Athens, MD  Additional Notes: Pt identified in Holding room.  Monitors applied. Working IV access confirmed. Timeout, Sterile prep L thigh.  #21ga ECHOgenic Arrow block needle into adductor canal with US  guidance.  20cc 0.75% Ropivacaine  injected incrementally after negative test dose.  Patient asymptomatic, VSS, no heme aspirated, tolerated well.   JAYSON Leonce, MD

## 2024-01-31 NOTE — Progress Notes (Signed)
 Orthopedic Tech Progress Note Patient Details:  Allison Harris Sep 22, 1945 [id number]  Ortho Devices Type of Ortho Device: Bone foam zero knee Ortho Device/Splint Location: LLE Ortho Device/Splint Interventions: Ordered   Post Interventions Instructions Provided: Adjustment of device, Care of device, Poper ambulation with device  Morna Pink 01/31/2024, 5:03 PM

## 2024-01-31 NOTE — Interval H&P Note (Signed)
 The patient has been re-examined, and the chart reviewed, and there have been no interval changes to the documented history and physical.    Plan for L TKA revision for instability  The operative side was examined and the patient was confirmed to have sensation to DPN, SPN, TN intact, Motor EHL, ext, flex 5/5, and DP 2+, PT 2+, No significant edema.   The risks, benefits, and alternatives have been discussed at length with patient, and the patient is willing to proceed.  Left knee marked. Consent has been signed.

## 2024-01-31 NOTE — Anesthesia Procedure Notes (Signed)
 Procedure Name: MAC Date/Time: 01/31/2024 1:14 PM  Performed by: Obadiah Reyes BROCKS, CRNAPre-anesthesia Checklist: Patient identified, Emergency Drugs available, Suction available, Patient being monitored and Timeout performed Patient Re-evaluated:Patient Re-evaluated prior to induction Oxygen Delivery Method: Simple face mask Preoxygenation: Pre-oxygenation with 100% oxygen Induction Type: IV induction Airway Equipment and Method: Oral airway

## 2024-02-01 ENCOUNTER — Encounter (HOSPITAL_COMMUNITY): Payer: Self-pay | Admitting: Orthopedic Surgery

## 2024-02-01 LAB — CBC
HCT: 34.9 % — ABNORMAL LOW (ref 36.0–46.0)
Hemoglobin: 11.4 g/dL — ABNORMAL LOW (ref 12.0–15.0)
MCH: 28.9 pg (ref 26.0–34.0)
MCHC: 32.7 g/dL (ref 30.0–36.0)
MCV: 88.6 fL (ref 80.0–100.0)
Platelets: 222 K/uL (ref 150–400)
RBC: 3.94 MIL/uL (ref 3.87–5.11)
RDW: 13.4 % (ref 11.5–15.5)
WBC: 10.2 K/uL (ref 4.0–10.5)
nRBC: 0 % (ref 0.0–0.2)

## 2024-02-01 LAB — BASIC METABOLIC PANEL WITH GFR
Anion gap: 8 (ref 5–15)
BUN: 17 mg/dL (ref 8–23)
CO2: 24 mmol/L (ref 22–32)
Calcium: 9.3 mg/dL (ref 8.9–10.3)
Chloride: 102 mmol/L (ref 98–111)
Creatinine, Ser: 0.54 mg/dL (ref 0.44–1.00)
GFR, Estimated: >60 mL/min
Glucose, Bld: 128 mg/dL — ABNORMAL HIGH (ref 70–99)
Potassium: 4.4 mmol/L (ref 3.5–5.1)
Sodium: 133 mmol/L — ABNORMAL LOW (ref 135–145)

## 2024-02-01 MED ORDER — ACETAMINOPHEN-CODEINE 300-30 MG PO TABS: 1.0000 | ORAL_TABLET | Freq: Once | ORAL | Status: DC

## 2024-02-01 MED ORDER — ACETAMINOPHEN-CODEINE 300-30 MG PO TABS
1.0000 | ORAL_TABLET | Freq: Four times a day (QID) | ORAL | Status: DC | PRN
  Administered 2024-02-01 – 2024-02-02 (×4): 2 via ORAL
  Administered 2024-02-02: 1 via ORAL
  Administered 2024-02-02 – 2024-02-03 (×3): 2 via ORAL
  Filled 2024-02-01 (×8): qty 2

## 2024-02-01 NOTE — Progress Notes (Signed)
" ° ° ° °  Subjective:  Patient reports pain as mild.  She is lying comfortably in bed this morning.  Discussed plan for mobilization with therapy today.  Discussed importance of continuing the extension exercises with a 0 degree pillow or a towel under the lower leg.  Yesterday's total administered Morphine  Milligram Equivalents: 57.5   Objective:   VITALS:   Vitals:   01/31/24 1811 01/31/24 2122 02/01/24 0215 02/01/24 0633  BP: (!) 141/85 138/80 114/71 113/64  Pulse: 65 73 68 66  Resp: 15 18 16 16   Temp: (!) 97.3 F (36.3 C) 97.7 F (36.5 C) 97.7 F (36.5 C) 97.8 F (36.6 C)  TempSrc:  Oral Oral Oral  SpO2: 98% 98% 97% 96%  Weight:      Height:        Sensation intact distally Intact pulses distally Dorsiflexion/Plantar flexion intact Incision: dressing C/D/I Compartment soft Wound vac holding suction  Lab Results  Component Value Date   WBC 10.2 02/01/2024   HGB 11.4 (L) 02/01/2024   HCT 34.9 (L) 02/01/2024   MCV 88.6 02/01/2024   PLT 222 02/01/2024   BMET    Component Value Date/Time   NA 133 (L) 02/01/2024 0334   K 4.4 02/01/2024 0334   CL 102 02/01/2024 0334   CO2 24 02/01/2024 0334   GLUCOSE 128 (H) 02/01/2024 0334   BUN 17 02/01/2024 0334   CREATININE 0.54 02/01/2024 0334   CALCIUM 9.3 02/01/2024 0334   GFRNONAA >60 02/01/2024 0334      Xray: TKA components in good position no adverse features  Assessment/Plan: 1 Day Post-Op   Principal Problem:   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 Edna at [facility].  Address: [address], [place], [place] [address]  Office Phone: [phone]    TORIBIO DELENA EDNA 02/01/2024, 7:00 AM   Toribio Edna, MD  Contact  information:   [phone] 7am-5pm epic message Dr. Edna, or call office for patient follow up: [phone] After hours and holidays please check [email or web address] for group call information for [facility]   "

## 2024-02-01 NOTE — Evaluation (Signed)
 Physical Therapy Evaluation Patient Details Name: Allison Harris MRN: [id number] DOB: 02/27/1945 Today's Date: 02/01/2024  History of Present Illness  Pt is a 78 y/o F admitted on 01/31/24 for scheduled L TKA revision. PMH: arthritis, HTN, hypothyroidism, L THA  Clinical Impression  Pt seen for PT evaluation with pt agreeable to tx. Pt reports prior to admission she was independent without AD (had L THA 2 months prior), living alone, notes 1 fall in the past 6 months. Pt reports her daughter can potentially assist at d/c but unsure as she's sick. PT provided pt with HEP handout & pt performed LLE strengthening/ROM exercises with PRN cuing for technique, AAROM PRN. Pt is able to progress to transferring to recliner with min assist but further mobility deferred 2/2 L knee pain & pt awaiting pain meds. Will plan to f/u with pt after she receives pain meds to progress gait.        If plan is discharge home, recommend the following: A little help with walking and/or transfers;A little help with bathing/dressing/bathroom;Assistance with cooking/housework;Assist for transportation;Help with stairs or ramp for entrance   Can travel by private vehicle        Equipment Recommendations None recommended by PT  Recommendations for Other Services       Functional Status Assessment Patient has had a recent decline in their functional status and demonstrates the ability to make significant improvements in function in a reasonable and predictable amount of time.     Precautions / Restrictions Precautions Precautions: Fall Restrictions Weight Bearing Restrictions Per Provider Order: Yes LLE Weight Bearing Per Provider Order: Weight bearing as tolerated      Mobility  Bed Mobility Overal bed mobility: Needs Assistance Bed Mobility: Supine to Sit     Supine to sit: Min assist, HOB elevated, Used rails (exit R side of bed, assistance/using UE to assist LLE to EOB)          Transfers Overall  transfer level: Needs assistance   Transfers: Sit to/from Stand, Bed to chair/wheelchair/BSC Sit to Stand: Min assist Stand pivot transfers: Min assist (bed>recliner on R with RW, decreased weight shifting to LLE)              Ambulation/Gait                  Stairs            Wheelchair Mobility     Tilt Bed    Modified Rankin (Stroke Patients Only)       Balance Overall balance assessment: Needs assistance Sitting-balance support: Feet supported Sitting balance-Leahy Scale: Good     Standing balance support: During functional activity, Bilateral upper extremity supported, Reliant on assistive device for balance Standing balance-Leahy Scale: Fair                               Pertinent Vitals/Pain Pain Assessment Pain Assessment: 0-10 Pain Score: 9  Pain Location: L knee with mobility Pain Descriptors / Indicators: Grimacing, Discomfort Pain Intervention(s): Monitored during session, Limited activity within patient's tolerance, Repositioned    Home Living Family/patient expects to be discharged to:: Private residence Living Arrangements: Alone Available Help at Discharge: Family;Friend(s);Available PRN/intermittently Type of Home: House Home Access: Stairs to enter Entrance Stairs-Rails: Right Entrance Stairs-Number of Steps: 3   Home Layout: Two level;Able to live on main level with bedroom/bathroom Home Equipment: Cane - single point;Rolling Walker (2 wheels)  Prior Function Prior Level of Function : Independent/Modified Independent;Working/employed;Driving             Mobility Comments: works part time, ambulatory without AD, 1 fall in the past 6 months ADLs Comments: independent     Extremity/Trunk Assessment   Upper Extremity Assessment Upper Extremity Assessment: Overall WFL for tasks assessed    Lower Extremity Assessment Lower Extremity Assessment: LLE deficits/detail LLE Deficits / Details: 2/5 knee  extension LLE: Unable to fully assess due to pain       Communication   Communication Communication: No apparent difficulties    Cognition Arousal: Alert Behavior During Therapy: WFL for tasks assessed/performed   PT - Cognitive impairments: No apparent impairments                         Following commands: Intact       Cueing Cueing Techniques: Verbal cues     General Comments      Exercises Total Joint Exercises Ankle Circles/Pumps: AROM, Supine, Both, 10 reps Quad Sets: AROM, Supine, Strengthening, Left, 10 reps Towel Squeeze: AROM, Strengthening, 10 reps, Supine, Both Short Arc Quad: AROM, Strengthening, Left, 10 reps, Supine Heel Slides: AAROM, Supine, Strengthening, Left, 10 reps Hip ABduction/ADduction: AAROM, Supine, Strengthening, Left, 10 reps Straight Leg Raises: AAROM, Supine, Strengthening, Left, 10 reps Long Arc Quad: AROM, Seated, Strengthening, Left, 5 reps Other Exercises Other Exercises: Educated pt on need to promote L knee extension.   Assessment/Plan    PT Assessment Patient needs continued PT services  PT Problem List Decreased strength;Pain;Decreased range of motion;Decreased activity tolerance;Decreased balance;Decreased mobility;Decreased knowledge of use of DME       PT Treatment Interventions DME instruction;Therapeutic exercise;Gait training;Balance training;Stair training;Neuromuscular re-education;Functional mobility training;Therapeutic activities;Patient/family education    PT Goals (Current goals can be found in the Care Plan section)  Acute Rehab PT Goals Patient Stated Goal: decreased pain PT Goal Formulation: With patient Time For Goal Achievement: 02/15/24 Potential to Achieve Goals: Good    Frequency 7X/week     Co-evaluation               AM-PAC PT 6 Clicks Mobility  Outcome Measure Help needed turning from your back to your side while in a flat bed without using bedrails?: None Help needed moving  from lying on your back to sitting on the side of a flat bed without using bedrails?: A Little Help needed moving to and from a bed to a chair (including a wheelchair)?: A Little Help needed standing up from a chair using your arms (e.g., wheelchair or bedside chair)?: A Little Help needed to walk in hospital room?: A Lot Help needed climbing 3-5 steps with a railing? : Total 6 Click Score: 16    End of Session   Activity Tolerance: Patient limited by pain Patient left: in chair;with call bell/phone within reach Nurse Communication: Mobility status PT Visit Diagnosis: Pain;Difficulty in walking, not elsewhere classified (R26.2);Muscle weakness (generalized) (M62.81);Other abnormalities of gait and mobility (R26.89);Unsteadiness on feet (R26.81) Pain - Right/Left: Left Pain - part of body: Knee    Time: 0940-1002 PT Time Calculation (min) (ACUTE ONLY): 22 min   Charges:   PT Evaluation $PT Eval Low Complexity: 1 Low PT Treatments $Therapeutic Exercise: 8-22 mins PT General Charges $$ ACUTE PT VISIT: 1 Visit         Richerd Pinal, PT, DPT 02/01/2024, 10:24 AM   Richerd CHRISTELLA Pinal 02/01/2024, 10:22 AM

## 2024-02-01 NOTE — Progress Notes (Signed)
 Physical Therapy Treatment Patient Details Name: Allison Harris MRN: [id number] DOB: [birth year] Today's Date: 02/01/2024   History of Present Illness Pt is a 78 y/o F admitted on 01/31/24 for scheduled L TKA revision. PMH: arthritis, HTN, hypothyroidism, L THA    PT Comments  Pt seen for PT tx with pt premedicated, reporting pain is more managed but slightly foggy 2/2 pain meds. Pt is able to ambulate into hallway with RW & CGA, gait pattern as noted below. Pt is making good progress with mobility. Will continue to follow pt acutely to progress gait & for stair negotiation prior to d/c.    If plan is discharge home, recommend the following: A little help with walking and/or transfers;A little help with bathing/dressing/bathroom;Assistance with cooking/housework;Assist for transportation;Help with stairs or ramp for entrance   Can travel by private vehicle        Equipment Recommendations  None recommended by PT    Recommendations for Other Services       Precautions / Restrictions Precautions Precautions: Fall Restrictions Weight Bearing Restrictions Per Provider Order: Yes LLE Weight Bearing Per Provider Order: Weight bearing as tolerated     Mobility  Bed Mobility Overal bed mobility: Needs Assistance Bed Mobility: Supine to Sit     Supine to sit: Min assist, HOB elevated, Used rails (exit R side of bed, assistance/using UE to assist LLE to EOB)          Transfers Overall transfer level: Needs assistance Equipment used: Rolling walker (2 wheels) Transfers: Sit to/from Stand Sit to Stand: Supervision Stand pivot transfers: Min assist (bed>recliner on R with RW, decreased weight shifting to LLE)         General transfer comment: sit>stand from recliner, elevated toilet seat    Ambulation/Gait Ambulation/Gait assistance: Contact guard assist Gait Distance (Feet): 110 Feet Assistive device: Rolling walker (2 wheels) Gait Pattern/deviations: Decreased step  length - left, Decreased step length - right, Decreased stride length Gait velocity: decreased     General Gait Details: cuing to increase L knee flexion during swing phase as able, decreased shoulder elevation   Stairs             Wheelchair Mobility     Tilt Bed    Modified Rankin (Stroke Patients Only)       Balance Overall balance assessment: Needs assistance Sitting-balance support: Feet supported Sitting balance-Leahy Scale: Good     Standing balance support: During functional activity, Bilateral upper extremity supported, Reliant on assistive device for balance Standing balance-Leahy Scale: Fair                              Hotel Manager: No apparent difficulties  Cognition Arousal: Alert Behavior During Therapy: WFL for tasks assessed/performed   PT - Cognitive impairments: No apparent impairments                       PT - Cognition Comments: feeling a little foggy 2/2 pain meds, nurse aware Following commands: Intact      Cueing Cueing Techniques: Verbal cues  Exercises Total Joint Exercises Ankle Circles/Pumps: AROM, Supine, Both, 10 reps Quad Sets: AROM, Supine, Strengthening, Left, 10 reps Towel Squeeze: AROM, Strengthening, 10 reps, Supine, Both Short Arc Quad: AROM, Strengthening, Left, 10 reps, Supine Heel Slides: AAROM, Supine, Strengthening, Left, 10 reps Hip ABduction/ADduction: AAROM, Supine, Strengthening, Left, 10 reps Straight Leg Raises: AAROM, Supine, Strengthening, Left, 10 reps  Long Arc Quad: AROM, Seated, Strengthening, Left, 5 reps Other Exercises Other Exercises: Educated pt on need to promote L knee extension.    General Comments General comments (skin integrity, edema, etc.): continent void      Pertinent Vitals/Pain Pain Assessment Pain Assessment: Faces Pain Score: 9  Faces Pain Scale: Hurts a little bit Pain Location: L knee with mobility Pain Descriptors /  Indicators: Grimacing, Discomfort Pain Intervention(s): Monitored during session, Premedicated before session, Limited activity within patient's tolerance    Home Living Family/patient expects to be discharged to:: Private residence Living Arrangements: Alone Available Help at Discharge: Family;Friend(s);Available PRN/intermittently Type of Home: House Home Access: Stairs to enter Entrance Stairs-Rails: Right Entrance Stairs-Number of Steps: 3   Home Layout: Two level;Able to live on main level with bedroom/bathroom Home Equipment: Cane - single point;Rolling Walker (2 wheels)      Prior Function            PT Goals (current goals can now be found in the care plan section) Acute Rehab PT Goals Patient Stated Goal: decreased pain PT Goal Formulation: With patient Time For Goal Achievement: 02/15/24 Potential to Achieve Goals: Good Progress towards PT goals: Progressing toward goals    Frequency    7X/week      PT Plan      Co-evaluation              AM-PAC PT 6 Clicks Mobility   Outcome Measure  Help needed turning from your back to your side while in a flat bed without using bedrails?: None Help needed moving from lying on your back to sitting on the side of a flat bed without using bedrails?: A Little Help needed moving to and from a bed to a chair (including a wheelchair)?: A Little Help needed standing up from a chair using your arms (e.g., wheelchair or bedside chair)?: A Little Help needed to walk in hospital room?: A Little Help needed climbing 3-5 steps with a railing? : A Little 6 Click Score: 19    End of Session Equipment Utilized During Treatment: Gait belt Activity Tolerance: Patient tolerated treatment well Patient left: in chair;with call bell/phone within reach Nurse Communication: Mobility status PT Visit Diagnosis: Pain;Difficulty in walking, not elsewhere classified (R26.2);Muscle weakness (generalized) (M62.81);Other abnormalities of  gait and mobility (R26.89);Unsteadiness on feet (R26.81) Pain - Right/Left: Left Pain - part of body: Knee     Time: 8852-8798 PT Time Calculation (min) (ACUTE ONLY): 14 min  Charges:    $Therapeutic Activity: 8-22 mins PT General Charges $$ ACUTE PT VISIT: 1 Visit                     Richerd Pinal, PT, DPT 02/01/2024, 12:25 PM    Richerd CHRISTELLA Pinal 02/01/2024, 12:25 PM

## 2024-02-01 NOTE — TOC Transition Note (Signed)
 Transition of Care [facility]) - Discharge Note   Patient Details  Name: Allison Harris MRN: [id number] Date of Birth: 1945/06/26  [facility]) CM/SW Contact:  NORMAN ASPEN, LCSW Phone Number: 02/01/2024, 1:36 PM   Clinical Narrative:     Met with pt who confirms she has needed DME in the home.  HHPT prearranged with [facility].  No further IP CM needs.  Final next level of care: Home w Home Health Services Barriers to Discharge: No Barriers Identified   Patient Goals and CMS Choice Patient states their goals for this hospitalization and ongoing recovery are:: return home          Discharge Placement                       Discharge Plan and Services Additional resources added to the After Visit Summary for                  DME Arranged: N/A DME Agency: NA       HH Arranged: PT HH Agency: [facility]        Social Drivers of Health (SDOH) Interventions SDOH Screenings   Food Insecurity: No Food Insecurity (01/31/2024)  Housing: Low Risk (01/31/2024)  Transportation Needs: No Transportation Needs (01/31/2024)  Utilities: Not At Risk (01/31/2024)  Social Connections: Moderately Isolated (01/31/2024)  Tobacco Use: Medium Risk (01/31/2024)     Readmission Risk Interventions    02/01/2024    1:36 PM 04/22/2023    9:49 AM  Readmission Risk Prevention Plan  Post Dischage Appt Complete Complete  Medication Screening Complete Complete  Transportation Screening Complete Complete

## 2024-02-02 LAB — CBC
HCT: 32.4 % — ABNORMAL LOW (ref 36.0–46.0)
Hemoglobin: 10.6 g/dL — ABNORMAL LOW (ref 12.0–15.0)
MCH: 29 pg (ref 26.0–34.0)
MCHC: 32.7 g/dL (ref 30.0–36.0)
MCV: 88.8 fL (ref 80.0–100.0)
Platelets: 187 K/uL (ref 150–400)
RBC: 3.65 MIL/uL — ABNORMAL LOW (ref 3.87–5.11)
RDW: 13.7 % (ref 11.5–15.5)
WBC: 7.9 K/uL (ref 4.0–10.5)
nRBC: 0 % (ref 0.0–0.2)

## 2024-02-02 NOTE — Progress Notes (Signed)
" ° ° °  2 Days Post-Op Procedures (LRB): TOTAL KNEE REVISION (Left)  Subjective:  Patient reports pain as severe today.  Reports having a rough night due to the pain.  Also had some hip soreness considering her somewhat recent total hip replacement 3-4 months ago.  No difficulty bearing weight on leg.  Ambulated 110 ft with PT yesterday.  Discussed importance of continuing the extension exercises with a 0 degree pillow or a towel under the lower leg.  Ice packs helping pain.  Patient requesting to stay another day.  Denies numbness or tingling in leg.  No other concerns.  Yesterday's total administered Morphine  Milligram Equivalents: 58   Objective:   VITALS:   Vitals:   02/01/24 1311 02/01/24 2247 02/02/24 0152 02/02/24 0502  BP: (!) 113/58 119/70 129/69 130/73  Pulse: 71 77 69 77  Resp: 16 18 18 18   Temp: 97.9 F (36.6 C) 98.1 F (36.7 C) 98.3 F (36.8 C) 98.4 F (36.9 C)  TempSrc: Oral Oral Oral Oral  SpO2: 93% 92% 96% 94%  Weight:      Height:        Resting in bed in NAD Sensation intact distally Intact pulses distally Dorsiflexion/Plantar flexion intact Incision: dressing C/D/I Compartment soft Wound vac holding suction, no fluid output in cannister Tolerates gentle left hip ROM, no significant swelling of hip Wiggles toes appropriately   Lab Results  Component Value Date   WBC 7.9 02/02/2024   HGB 10.6 (L) 02/02/2024   HCT 32.4 (L) 02/02/2024   MCV 88.8 02/02/2024   PLT 187 02/02/2024   BMET    Component Value Date/Time   NA 133 (L) 02/01/2024 0334   K 4.4 02/01/2024 0334   CL 102 02/01/2024 0334   CO2 24 02/01/2024 0334   GLUCOSE 128 (H) 02/01/2024 0334   BUN 17 02/01/2024 0334   CREATININE 0.54 02/01/2024 0334   CALCIUM 9.3 02/01/2024 0334   GFRNONAA >60 02/01/2024 0334      Xray: TKA components in good position no adverse features  Assessment/Plan: 2 Days Post-Op   Principal Problem:   Failed total knee replacement  S/p L TKA revision  01/31/24  Post op recs: WB: WBAT Abx: ancef  x23 hours post op, followed by 7 days of cefadroxil  500 twice daily Imaging: PACU xrays DVT prophylaxis: Aspirin  81mg  BID x4 weeks Follow up: 1 week after surgery for a wound check with Dr. Edna at [facility].  Address: [address], [place], [place] [address]  Office Phone: [phone]    Allison Harris 02/02/2024, 7:42 AM    Contact information:   Weekdays 7am-5pm epic message Dr. Edna, or call office for patient follow up: [phone] After hours and holidays please check [email or web address] for group call information for [facility]   "

## 2024-02-02 NOTE — Care Management Important Message (Signed)
 Important Message  Patient Details IM Letter given. Name: Allison Harris MRN: [id number] Date of Birth: Jun 30, 1945   Important Message Given:  Yes - Medicare IM     Melba Ates 02/02/2024, 12:24 PM

## 2024-02-02 NOTE — Progress Notes (Signed)
 Physical Therapy Treatment Patient Details Name: Allison Harris MRN: [id number] DOB: 1945-08-19 Today's Date: 02/02/2024   History of Present Illness Pt is a 78 y/o F admitted on 01/31/24 for scheduled L TKA revision. PMH: arthritis, HTN, hypothyroidism, L THA    PT Comments  Initially returned around 1440 but pt in near tears from pain and requesting meds and to rest a few mins.  RN gave pt IV dilaudid , PT returned at 1513 and pt with improved pain.  She was able to participate in exercises and ambulated 110' with RW and CGA.  Held stairs as pt with increased pain earlier and received IV dilaudid . Pt requiring IV meds and would benefit from another day of therapy.     If plan is discharge home, recommend the following: A little help with walking and/or transfers;A little help with bathing/dressing/bathroom;Assistance with cooking/housework;Assist for transportation;Help with stairs or ramp for entrance   Can travel by private vehicle        Equipment Recommendations  None recommended by PT    Recommendations for Other Services       Precautions / Restrictions Precautions Precautions: Fall;Knee Restrictions LLE Weight Bearing Per Provider Order: Weight bearing as tolerated     Mobility  Bed Mobility Overal bed mobility: Needs Assistance Bed Mobility: Supine to Sit, Sit to Supine     Supine to sit: HOB elevated, Contact guard Sit to supine: HOB elevated, Min assist   General bed mobility comments: Educated on gait belt to assist L LE    Transfers Overall transfer level: Needs assistance Equipment used: Rolling walker (2 wheels) Transfers: Sit to/from Stand Sit to Stand: Contact guard assist           General transfer comment: CGA from elevated bed    Ambulation/Gait Ambulation/Gait assistance: Contact guard assist Gait Distance (Feet): 80 Feet Assistive device: Rolling walker (2 wheels) Gait Pattern/deviations: Step-to pattern, Decreased stride length, Decreased  weight shift to left Gait velocity: decreased but functional     General Gait Details: L foot with toe in/internal rotation from hip - could partially correct with cues   Stairs Stairs:  (held due to pain, not d/cing today)           Wheelchair Mobility     Tilt Bed    Modified Rankin (Stroke Patients Only)       Balance Overall balance assessment: Needs assistance Sitting-balance support: Feet supported Sitting balance-Leahy Scale: Good     Standing balance support: During functional activity, Bilateral upper extremity supported, Reliant on assistive device for balance Standing balance-Leahy Scale: Fair Standing balance comment: RW to ambulate; could static stand without support; performed toileting ADLs                            Communication    Cognition Arousal: Alert Behavior During Therapy: WFL for tasks assessed/performed   PT - Cognitive impairments: No apparent impairments                                Cueing    Exercises Total Joint Exercises Ankle Circles/Pumps: AROM, Supine, Both, 10 reps Quad Sets: AROM, Supine, Left, 10 reps (w bone foam) Heel Slides: AAROM, Supine, Strengthening, Left, 10 reps Hip ABduction/ADduction: AAROM, Supine, Strengthening, Left, 10 reps Long Arc Quad: Seated, Strengthening, Left, AAROM, 10 reps Knee Flexion: AAROM, Left, 10 reps, Seated Goniometric ROM: L knee 5  to 80 degrees    General Comments        Pertinent Vitals/Pain Pain Assessment Pain Assessment: 0-10 Pain Score: 5  Pain Location: L knee with mobility Pain Descriptors / Indicators: Discomfort Pain Intervention(s): Limited activity within patient's tolerance, Monitored during session, Premedicated before session, Repositioned, Ice applied (IV dilaudid  prior to session)    Home Living                          Prior Function            PT Goals (current goals can now be found in the care plan section) Progress  towards PT goals: Progressing toward goals    Frequency    7X/week      PT Plan      Co-evaluation              AM-PAC PT 6 Clicks Mobility   Outcome Measure  Help needed turning from your back to your side while in a flat bed without using bedrails?: None Help needed moving from lying on your back to sitting on the side of a flat bed without using bedrails?: A Little Help needed moving to and from a bed to a chair (including a wheelchair)?: A Little Help needed standing up from a chair using your arms (e.g., wheelchair or bedside chair)?: A Little Help needed to walk in hospital room?: A Little Help needed climbing 3-5 steps with a railing? : A Little 6 Click Score: 19    End of Session Equipment Utilized During Treatment: Gait belt Activity Tolerance: Patient tolerated treatment well (but with IV pain meds) Patient left: with call bell/phone within reach;in bed;with bed alarm set;with SCD's reapplied Nurse Communication: Mobility status PT Visit Diagnosis: Pain;Difficulty in walking, not elsewhere classified (R26.2);Muscle weakness (generalized) (M62.81);Other abnormalities of gait and mobility (R26.89) Pain - Right/Left: Left Pain - part of body: Knee     Time: 8486-8462 PT Time Calculation (min) (ACUTE ONLY): 24 min  Charges:    $Gait Training: 8-22 mins $Therapeutic Exercise: 8-22 mins PT General Charges $$ ACUTE PT VISIT: 1 Visit                     Benjiman, PT Acute Rehab [facility] [phone]    [name] 02/02/2024, 3:52 PM

## 2024-02-02 NOTE — Progress Notes (Signed)
 PT Cancellation Note  Patient Details Name: Allison Harris MRN: [id number] DOB: Mar 12, 1945   Cancelled Treatment:    Reason Eval/Treat Not Completed: Other (comment)  Pt getting IV antibiotics at this time, reports if she moves arm the IV clogs, needs to finish meds first and states should only be 10-15 mins.  Will see another pt then return for second PT session. Benjiman, PT Acute Rehab [facility] [phone]  [name] 02/02/2024, 2:11 PM

## 2024-02-02 NOTE — Progress Notes (Signed)
 Physical Therapy Treatment Patient Details Name: Allison Harris MRN: [id number] DOB: 1946/01/15 Today's Date: 02/02/2024   History of Present Illness Pt is a 78 y/o F admitted on 01/31/24 for scheduled L TKA revision. PMH: arthritis, HTN, hypothyroidism, L THA    PT Comments  Pt reports terrible night from pain and took IV pain meds earlier this morning.  Performed limited exercise reps and ROM , and held stairs due to pain. She did ambulate 26' with CGA.  Noting in MD note that pt requesting to stay another night. Will f/u in afternoon.    If plan is discharge home, recommend the following: A little help with walking and/or transfers;A little help with bathing/dressing/bathroom;Assistance with cooking/housework;Assist for transportation;Help with stairs or ramp for entrance   Can travel by private vehicle        Equipment Recommendations  None recommended by PT    Recommendations for Other Services       Precautions / Restrictions Precautions Precautions: Fall;Knee Restrictions LLE Weight Bearing Per Provider Order: Weight bearing as tolerated     Mobility  Bed Mobility Overal bed mobility: Needs Assistance Bed Mobility: Supine to Sit, Sit to Supine     Supine to sit: Min assist Sit to supine: Min assist   General bed mobility comments: assist for L LE    Transfers Overall transfer level: Needs assistance Equipment used: Rolling walker (2 wheels) Transfers: Sit to/from Stand Sit to Stand: Min assist           General transfer comment: STS x 2 but required bed elevated and grab bar    Ambulation/Gait Ambulation/Gait assistance: Contact guard assist Gait Distance (Feet): 80 Feet Assistive device: Rolling walker (2 wheels) Gait Pattern/deviations: Step-to pattern, Decreased stride length, Decreased weight shift to left Gait velocity: decreased     General Gait Details: L foot with toe in/internal rotation from hip - could partially correct with cues; noting  lack of terminal knee ext on L with gait   Stairs             Wheelchair Mobility     Tilt Bed    Modified Rankin (Stroke Patients Only)       Balance Overall balance assessment: Needs assistance Sitting-balance support: Feet supported Sitting balance-Leahy Scale: Good       Standing balance-Leahy Scale: Fair Standing balance comment: RW to ambulate; could static stand without support; performed toileting ADLs                            Communication    Cognition Arousal: Alert Behavior During Therapy: WFL for tasks assessed/performed   PT - Cognitive impairments: No apparent impairments                                Cueing    Exercises Total Joint Exercises Ankle Circles/Pumps: AROM, Supine, Both, 10 reps Quad Sets: AROM, Supine, Left, 10 reps (w bone foam) Heel Slides: AAROM, Supine, Strengthening, Left, 5 reps Hip ABduction/ADduction: AAROM, Supine, Strengthening, Left, 5 reps Long Arc Quad: Seated, Strengthening, Left, 5 reps, AAROM Knee Flexion: AAROM, Left, 10 reps, Seated Goniometric ROM: L knee lacking 10 ext; 80 flex    General Comments        Pertinent Vitals/Pain Pain Assessment Pain Assessment: 0-10 Pain Score: 7  Pain Location: L knee with mobility Pain Descriptors / Indicators: Grimacing, Discomfort Pain Intervention(s): Limited  activity within patient's tolerance, Monitored during session, Premedicated before session, Repositioned, Other (comment) (Reports terrible night; received IV pain meds earlier today)    Home Living                          Prior Function            PT Goals (current goals can now be found in the care plan section) Progress towards PT goals: Progressing toward goals    Frequency    7X/week      PT Plan      Co-evaluation              AM-PAC PT 6 Clicks Mobility   Outcome Measure  Help needed turning from your back to your side while in a flat bed  without using bedrails?: None Help needed moving from lying on your back to sitting on the side of a flat bed without using bedrails?: A Little Help needed moving to and from a bed to a chair (including a wheelchair)?: A Little Help needed standing up from a chair using your arms (e.g., wheelchair or bedside chair)?: A Little Help needed to walk in hospital room?: A Little Help needed climbing 3-5 steps with a railing? : A Little 6 Click Score: 19    End of Session Equipment Utilized During Treatment: Gait belt Activity Tolerance: Patient tolerated treatment well Patient left: with call bell/phone within reach;in bed;with bed alarm set Nurse Communication: Mobility status PT Visit Diagnosis: Pain;Difficulty in walking, not elsewhere classified (R26.2);Muscle weakness (generalized) (M62.81);Other abnormalities of gait and mobility (R26.89) Pain - Right/Left: Left Pain - part of body: Knee     Time: 8873-8846 PT Time Calculation (min) (ACUTE ONLY): 27 min  Charges:    $Gait Training: 8-22 mins $Therapeutic Exercise: 8-22 mins PT General Charges $$ ACUTE PT VISIT: 1 Visit                     Benjiman, PT Acute Rehab [facility] Re[facility] [phone]    Benjiman Allison Harris 02/02/2024, 1:33 PM

## 2024-02-02 NOTE — Plan of Care (Signed)
   Problem: Coping: Goal: Level of anxiety will decrease Outcome: Progressing   Problem: Pain Managment: Goal: General experience of comfort will improve and/or be controlled Outcome: Progressing   Problem: Safety: Goal: Ability to remain free from injury will improve Outcome: Progressing

## 2024-02-02 NOTE — Plan of Care (Signed)
" °  Problem: Nutritional: Goal: Will attain and maintain optimal nutritional status Outcome: Progressing   Problem: Respiratory: Goal: Will regain and/or maintain adequate ventilation Outcome: Progressing   Problem: Skin Integrity: Goal: Demonstrates signs of wound healing without infection Outcome: Progressing   Problem: Activity: Goal: Risk for activity intolerance will decrease Outcome: Progressing   Problem: Nutrition: Goal: Adequate nutrition will be maintained Outcome: Progressing   "

## 2024-02-03 MED ORDER — CEFADROXIL 500 MG PO CAPS: 500.0000 mg | ORAL_CAPSULE | Freq: Two times a day (BID) | ORAL | 0 refills | Status: AC

## 2024-02-03 MED ORDER — METHOCARBAMOL 500 MG PO TABS: 500.0000 mg | ORAL_TABLET | Freq: Three times a day (TID) | ORAL | 0 refills | Status: AC | PRN

## 2024-02-03 MED ORDER — POLYETHYLENE GLYCOL 3350 17 G PO PACK: 17.0000 g | PACK | Freq: Every day | ORAL | 0 refills | Status: AC

## 2024-02-03 MED ORDER — ONDANSETRON HCL 4 MG PO TABS: 4.0000 mg | ORAL_TABLET | Freq: Three times a day (TID) | ORAL | 0 refills | Status: AC | PRN

## 2024-02-03 MED ORDER — ACETAMINOPHEN-CODEINE 300-30 MG PO TABS: 1.0000 | ORAL_TABLET | Freq: Four times a day (QID) | ORAL | 0 refills | Status: AC | PRN

## 2024-02-03 MED ORDER — POLYETHYLENE GLYCOL 3350 17 G PO PACK
17.0000 g | PACK | Freq: Every day | ORAL | Status: DC
  Administered 2024-02-03: 17 g via ORAL
  Filled 2024-02-03: qty 1

## 2024-02-03 MED ORDER — MAGNESIUM CITRATE PO SOLN: 1.0000 | Freq: Every day | ORAL | Status: DC | PRN

## 2024-02-03 MED ORDER — CELECOXIB 100 MG PO CAPS: 100.0000 mg | ORAL_CAPSULE | Freq: Two times a day (BID) | ORAL | 0 refills | Status: AC

## 2024-02-03 MED ORDER — OMEPRAZOLE 40 MG PO CPDR: 40.0000 mg | DELAYED_RELEASE_CAPSULE | Freq: Every day | ORAL | 0 refills | Status: AC

## 2024-02-03 NOTE — Progress Notes (Signed)
 Physical Therapy Treatment Patient Details Name: Allison Harris MRN: [id number] DOB: 10-28-1945 Today's Date: 02/03/2024   History of Present Illness Pt is a 79 y/o F admitted on 01/31/24 for scheduled L TKA revision. PMH: arthritis, HTN, hypothyroidism, L THA    PT Comments  Pt is POD # 3 and is progressing well.  Pt with much improved pain.  [name] and performed stairs similar to home setup. Pt with good ROM, quad activation, and understanding of HEP.  Pt demonstrates safe gait & transfers in order to return home from PT perspective once discharged by MD.  While in hospital, will continue to benefit from PT for skilled therapy to advance mobility and exercises.       If plan is discharge home, recommend the following: A little help with walking and/or transfers;A little help with bathing/dressing/bathroom;Assistance with cooking/housework;Assist for transportation;Help with stairs or ramp for entrance   Can travel by private vehicle        Equipment Recommendations  None recommended by PT    Recommendations for Other Services       Precautions / Restrictions Precautions Precautions: Fall;Knee Restrictions LLE Weight Bearing Per Provider Order: Weight bearing as tolerated     Mobility  Bed Mobility Overal bed mobility: Needs Assistance Bed Mobility: Supine to Sit, Sit to Supine     Supine to sit: Supervision Sit to supine: Supervision   General bed mobility comments: Educated on gait belt to assist L LE    Transfers Overall transfer level: Needs assistance Equipment used: Rolling walker (2 wheels) Transfers: Sit to/from Stand Sit to Stand: Supervision           General transfer comment: STS from bed and toilet; able to do today without bed elevated    Ambulation/Gait Ambulation/Gait assistance: Supervision Gait Distance (Feet): 100 Feet Assistive device: Rolling walker (2 wheels) Gait Pattern/deviations: Step-to pattern, Decreased stride  length, Decreased weight shift to left Gait velocity: decreased but functional     General Gait Details: L foot with toe in/internal rotation from hip - could partially correct with cues; steady with RW   Stairs Stairs: Yes Stairs assistance: Contact guard assist   Number of Stairs: 5 General stair comments: Started bil rails progressed to single rail; educated on sequencing; tolerated well   Wheelchair Mobility     Tilt Bed    Modified Rankin (Stroke Patients Only)       Balance Overall balance assessment: Needs assistance Sitting-balance support: Feet supported Sitting balance-Leahy Scale: Good     Standing balance support: No upper extremity supported Standing balance-Leahy Scale: Fair Standing balance comment: RW to ambulate; could static stand without support; performed toileting ADLs                            Communication    Cognition Arousal: Alert Behavior During Therapy: WFL for tasks assessed/performed   PT - Cognitive impairments: No apparent impairments                                Cueing    Exercises Total Joint Exercises Ankle Circles/Pumps: AROM, Supine, Both, 10 reps Quad Sets: AROM, Supine, Left, 10 reps (w bone foam) Heel Slides: AAROM, Supine, Strengthening, Left, 10 reps Hip ABduction/ADduction: AAROM, Supine, Strengthening, Left, 10 reps Long Arc Quad: Seated, Strengthening, Left, AAROM, 10 reps Knee Flexion: AAROM, Left, 10 reps,  Seated Goniometric ROM: L knee 5 to 80 degrees    General Comments   Educated on safe ice use, no pivots, car transfers, resting with leg straight, and TED hose during day. Also, encouraged walking every 1-2 hours during day. Educated on HEP with focus on mobility the first weeks. Discussed doing exercises within pain control and if pain increasing could decreased ROM, reps, and stop exercises as needed. Encouraged to perform quad sets and ankle pumps frequently for blood flow and to  promote full knee extension.      Pertinent Vitals/Pain Pain Assessment Pain Assessment: 0-10 Pain Score: 3  Pain Location: L knee with mobility Pain Descriptors / Indicators: Discomfort Pain Intervention(s): Limited activity within patient's tolerance, Monitored during session, Premedicated before session, Ice applied    Home Living                          Prior Function            PT Goals (current goals can now be found in the care plan section) Progress towards PT goals: Progressing toward goals    Frequency    7X/week      PT Plan      Co-evaluation              AM-PAC PT 6 Clicks Mobility   Outcome Measure  Help needed turning from your back to your side while in a flat bed without using bedrails?: None Help needed moving from lying on your back to sitting on the side of a flat bed without using bedrails?: A Little Help needed moving to and from a bed to a chair (including a wheelchair)?: A Little Help needed standing up from a chair using your arms (e.g., wheelchair or bedside chair)?: A Little Help needed to walk in hospital room?: A Little Help needed climbing 3-5 steps with a railing? : A Little 6 Click Score: 19    End of Session Equipment Utilized During Treatment: Gait belt Activity Tolerance: Patient tolerated treatment well (.) Patient left: with call bell/phone within reach;in bed;with bed alarm set Nurse Communication: Mobility status PT Visit Diagnosis: Pain;Difficulty in walking, not elsewhere classified (R26.2);Muscle weakness (generalized) (M62.81);Other abnormalities of gait and mobility (R26.89) Pain - Right/Left: Left Pain - part of body: Knee     Time: 0916-0952 PT Time Calculation (min) (ACUTE ONLY): 36 min  Charges:    $Gait Training: 8-22 mins $Therapeutic Exercise: 8-22 mins PT General Charges $$ ACUTE PT VISIT: 1 Visit                     Benjiman, PT Acute Rehab Services [facility]  [phone]    [name] 02/03/2024, 11:26 AM

## 2024-02-03 NOTE — Discharge Summary (Signed)
 Physician Discharge Summary  Patient ID: Allison Harris MRN: [id number] DOB/AGE: 05-08-1945 78 y.o.  Admit date: 01/31/2024 Discharge date: 02/03/2024  Admission Diagnoses:  Failed total knee replacement  Discharge Diagnoses:  Principal Problem:   Failed total knee replacement   Past Medical History:  Diagnosis Date   Arthritis    Depression    Hypertension    Hypothyroidism     Surgeries: Procedures: TOTAL KNEE REVISION on 01/31/2024   Consultants (if any):   Discharged Condition: Improved  Hospital Course: Allison Harris is an 79 y.o. female who was admitted 01/31/2024 with a diagnosis of Failed total knee replacement and went to the operating room on 01/31/2024 and underwent the above named procedures.    She was given perioperative antibiotics:  Anti-infectives (From admission, onward)    Start     Dose/Rate Route Frequency Ordered Stop   02/03/24 2200  cefadroxil  (DURICEF) capsule 500 mg        500 mg Oral 2 times daily 01/31/24 1818 02/10/24 2159   02/03/24 1000  cefadroxil  (DURICEF) capsule 500 mg  Status:  Discontinued        500 mg Oral 2 times daily 01/31/24 1809 01/31/24 1818   01/31/24 2200  ceFAZolin  (ANCEF ) IVPB 2g/100 mL premix        2 g 200 mL/hr over 30 Minutes Intravenous Every 8 hours 01/31/24 1809 02/03/24 2159   01/31/24 1030  ceFAZolin  (ANCEF ) IVPB 2g/100 mL premix        2 g 200 mL/hr over 30 Minutes Intravenous On call to O.R. 01/31/24 1019 01/31/24 1315   01/31/24 0000  cefadroxil  (DURICEF) 500 MG capsule        500 mg Oral 2 times daily 01/31/24 1017 02/07/24 2359     .  She was given sequential compression devices, early ambulation, and aspirin  for DVT prophylaxis.  She benefited maximally from the hospital stay and there were no complications.    Recent vital signs:  Vitals:   02/02/24 2110 02/03/24 0508  BP: 110/64 125/66  Pulse: 73 63  Resp: 15 16  Temp: 98 F (36.7 C) 98.7 F (37.1 C)  SpO2: 93% 96%    Recent laboratory  studies:  Lab Results  Component Value Date   HGB 10.6 (L) 02/02/2024   HGB 11.4 (L) 02/01/2024   HGB 13.6 01/18/2024   Lab Results  Component Value Date   WBC 7.9 02/02/2024   PLT 187 02/02/2024   No results found for: INR Lab Results  Component Value Date   NA 133 (L) 02/01/2024   K 4.4 02/01/2024   CL 102 02/01/2024   CO2 24 02/01/2024   BUN 17 02/01/2024   CREATININE 0.54 02/01/2024   GLUCOSE 128 (H) 02/01/2024    Discharge Medications:   Allergies as of 02/03/2024   No Known Allergies      Medication List     STOP taking these medications    ciprofloxacin  500 MG tablet Commonly known as: CIPRO        TAKE these medications    acetaminophen  500 MG tablet Commonly known as: TYLENOL  Take 2 tablets (1,000 mg total) by mouth every 8 (eight) hours as needed.   ascorbic acid  500 MG tablet Commonly known as: VITAMIN C Take 500 mg by mouth daily.   aspirin  EC 81 MG tablet Take 1 tablet (81 mg total) by mouth 2 (two) times daily for 28 days. Swallow whole.   b complex vitamins capsule Take 1 capsule by  mouth daily.   cefadroxil  500 MG capsule Commonly known as: DURICEF Take 1 capsule (500 mg total) by mouth 2 (two) times daily for 7 days.   celecoxib  100 MG capsule Commonly known as: CeleBREX  Take 1 capsule (100 mg total) by mouth 2 (two) times daily for 14 days.   lisinopril -hydrochlorothiazide  10-12.5 MG tablet Commonly known as: ZESTORETIC  Take 1 tablet by mouth daily.   methocarbamol  500 MG tablet Commonly known as: ROBAXIN  Take 1 tablet (500 mg total) by mouth every 8 (eight) hours as needed for up to 10 days for muscle spasms.   multivitamin with minerals Tabs tablet Take 1 tablet by mouth daily.   omeprazole  40 MG capsule Commonly known as: PRILOSEC Take 1 capsule (40 mg total) by mouth daily.   ondansetron  4 MG tablet Commonly known as: Zofran  Take 1 tablet (4 mg total) by mouth every 8 (eight) hours as needed for up to 14 days for  nausea or vomiting.   oxyCODONE  5 MG immediate release tablet Commonly known as: Roxicodone  Take 1 tablet (5 mg total) by mouth every 4 (four) hours as needed for up to 7 days for severe pain (pain score 7-10) or moderate pain (pain score 4-6).   polyethylene glycol 17 g packet Commonly known as: MiraLax  Take 17 g by mouth daily.   sertraline  50 MG tablet Commonly known as: ZOLOFT  Take 50 mg by mouth daily.   Synthroid  25 MCG tablet Generic drug: levothyroxine  Take 25 mcg by mouth daily before breakfast.   vitamin E 180 MG (400 UNITS) capsule Take 400 Units by mouth daily.        Diagnostic Studies: DG Knee Left Port Result Date: 01/31/2024 CLINICAL DATA:  Status post left knee replacement. EXAM: PORTABLE LEFT KNEE - 1-2 VIEW COMPARISON:  Radiograph 04/21/2023 FINDINGS: Revision knee arthroplasty in expected alignment. No periprosthetic lucency or fracture. There has been patellar resurfacing. Recent postsurgical change includes air and edema in the soft tissues and joint space. Wound VAC overlies the anterior proximal tibia. IMPRESSION: Revision knee arthroplasty without immediate postoperative complication. Electronically Signed   By: Andrea Gasman M.D.   On: 01/31/2024 18:35    Disposition: Discharge disposition: 01-Home or Self Care       Discharge Instructions     Call MD / Call 911   Complete by: As directed    If you experience chest pain or shortness of breath, CALL 911 and be transported to the hospital emergency room.  If you develope a fever above 101 F, pus (white drainage) or increased drainage or redness at the wound, or calf pain, call your surgeon's office.   Constipation Prevention   Complete by: As directed    Drink plenty of fluids.  Prune juice may be helpful.  You may use a stool softener, such as Colace (over the counter) 100 mg twice a day.  Use MiraLax  (over the counter) for constipation as needed.   Increase activity slowly as tolerated    Complete by: As directed    Post-operative opioid taper instructions:   Complete by: As directed    POST-OPERATIVE OPIOID TAPER INSTRUCTIONS: It is important to wean off of your opioid medication as soon as possible. If you do not need pain medication after your surgery it is ok to stop day one. Opioids include: Codeine , Hydrocodone (Norco, Vicodin), Oxycodone (Percocet, oxycontin ) and hydromorphone  amongst others.  Long term and even short term use of opiods can cause: Increased pain response Dependence Constipation Depression Respiratory depression And more.  Withdrawal symptoms can include Flu like symptoms Nausea, vomiting And more Techniques to manage these symptoms Hydrate well Eat regular healthy meals Stay active Use relaxation techniques(deep breathing, meditating, yoga) Do Not substitute Alcohol  to help with tapering If you have been on opioids for less than two weeks and do not have pain than it is ok to stop all together.  Plan to wean off of opioids This plan should start within one week post op of your joint replacement. Maintain the same interval or time between taking each dose and first decrease the dose.  Cut the total daily intake of opioids by one tablet each day Next start to increase the time between doses. The last dose that should be eliminated is the evening dose.           Follow-up Information     Edna Toribio LABOR, MD Follow up in 1 week(s).   Specialty: Orthopedic Surgery Contact information: [address] [place] [address] [phone]         [facility] Follow up.   Specialty: Home Health Services Why: to provide home physical therapy visits Contact information: [address] [place] [address] [phone]                    Discharge Instructions      INSTRUCTIONS AFTER JOINT REPLACEMENT   Remove items at home which could result in a fall. This includes throw rugs or  furniture in walking pathways ICE to the affected joint every three hours while awake for 30 minutes at a time, for at least the first 3-5 days, and then as needed for pain and swelling.  Continue to use ice for pain and swelling. You may notice swelling that will progress down to the foot and ankle.  This is normal after surgery.  Elevate your leg when you are not up walking on it.   Continue to use the breathing machine you got in the hospital (incentive spirometer) which will help keep your temperature down.  It is common for your temperature to cycle up and down following surgery, especially at night when you are not up moving around and exerting yourself.  The breathing machine keeps your lungs expanded and your temperature down.  DIET:  As you were doing prior to hospitalization, we recommend a well-balanced diet.  DRESSING / WOUND CARE / SHOWERING:  Keep the surgical dressing until follow up.  This has battery powered suction to be kept at 125 mmHg and lasts 7 days.  If the battery stops sooner than 7 days, then call our office for further instructions.    The dressing is also water  resistant, however it is best to shower with an extra covering, such as saran wrap to keep it dry.  IF THE DRESSING FALLS OFF or the wound gets wet inside, change the dressing with sterile gauze and call our office for further instruction.  Please use good hand washing techniques before changing the dressing.  Do not use any lotions or creams on the incision until instructed by your surgeon.       ACTIVITY  Increase activity slowly as tolerated, but follow the weight bearing instructions below.   No driving for 6 weeks or until further direction given by your physician.  You cannot drive while taking narcotics.  No lifting or carrying greater than 10 lbs. until further directed by your surgeon. Avoid periods of inactivity such as sitting longer than  an hour when not asleep. This helps prevent blood clots.  You may  return to work once you are authorized by your doctor.   WEIGHT BEARING: Weight bearing as tolerated with assist device (walker, cane, etc) as directed, use it as long as suggested by your surgeon or therapist, typically at least 4-6 weeks.  EXERCISES  Results after joint replacement surgery are often greatly improved when you follow the exercise, range of motion and muscle strengthening exercises prescribed by your doctor. Safety measures are also important to protect the joint from further injury. Any time any of these exercises cause you to have increased pain or swelling, decrease what you are doing until you are comfortable again and then slowly increase them. If you have problems or questions, call your caregiver or physical therapist for advice.   Rehabilitation is important following a joint replacement. After just a few days of immobilization, the muscles of the leg can become weakened and shrink (atrophy).  These exercises are designed to build up the tone and strength of the thigh and leg muscles and to improve motion. Often times heat used for twenty to thirty minutes before working out will loosen up your tissues and help with improving the range of motion but do not use heat for the first two weeks following surgery (sometimes heat can increase post-operative swelling).   These exercises can be done on a training (exercise) mat, on the floor, on a table or on a bed. Use whatever works the best and is most comfortable for you.    Use music or television while you are exercising so that the exercises are a pleasant break in your day. This will make your life better with the exercises acting as a break in your routine that you can look forward to.   Perform all exercises about fifteen times, three times per day or as directed.  You should exercise both the operative leg and the other leg as well.  Exercises include:   Quad Sets - Tighten up the muscle on the front of the thigh (Quad) and  hold for 5-10 seconds.   Straight Leg Raises - With your knee straight (if you were given a brace, keep it on), lift the leg to 60 degrees, hold for 3 seconds, and slowly lower the leg.  Perform this exercise against resistance later as your leg gets stronger.  Leg Slides: Lying on your back, slowly slide your foot toward your buttocks, bending your knee up off the floor (only go as far as is comfortable). Then slowly slide your foot back down until your leg is flat on the floor again.  Angel Wings: Lying on your back spread your legs to the side as far apart as you can without causing discomfort.  Hamstring Strength:  Lying on your back, push your heel against the floor with your leg straight by tightening up the muscles of your buttocks.  Repeat, but this time bend your knee to a comfortable angle, and push your heel against the floor.  You may put a pillow under the heel to make it more comfortable if necessary.   A rehabilitation program following joint replacement surgery can speed recovery and prevent re-injury in the future due to weakened muscles. Contact your doctor or a physical therapist for more information on knee rehabilitation.   CONSTIPATION:  Constipation is defined medically as fewer than three stools per week and severe constipation as less than one stool per week.  Even if you have  a regular bowel pattern at home, your normal regimen is likely to be disrupted due to multiple reasons following surgery.  Combination of anesthesia, postoperative narcotics, change in appetite and fluid intake all can affect your bowels.   YOU MUST use at least one of the following options; they are listed in order of increasing strength to get the job done.  They are all available over the counter, and you may need to use some, POSSIBLY even all of these options:    Drink plenty of fluids (prune juice may be helpful) and high fiber foods Colace 100 mg by mouth twice a day  Senokot for constipation as  directed and as needed Dulcolax (bisacodyl), take with full glass of water   Miralax  (polyethylene glycol) once or twice a day as needed.  If you have tried all these things and are unable to have a bowel movement in the first 3-4 days after surgery call either your surgeon or your primary doctor.    If you experience loose stools or diarrhea, hold the medications until you stool forms back up.  If your symptoms do not get better within 1 week or if they get worse, check with your doctor.  If you experience the worst abdominal pain ever or develop nausea or vomiting, please contact the office immediately for further recommendations for treatment.  ITCHING:  If you experience itching with your medications, try taking only a single pain pill, or even half a pain pill at a time.  You can also use Benadryl  over the counter for itching or also to help with sleep.   TED HOSE STOCKINGS:  Use stockings on both legs until for at least 2 weeks or as directed by physician office. They may be removed at night for sleeping.  MEDICATIONS:  See your medication summary on the After Visit Summary that nursing will review with you.  You may have some home medications which will be placed on hold until you complete the course of blood thinner medication.  It is important for you to complete the blood thinner medication as prescribed.  Blood clot prevention (DVT Prophylaxis): After surgery you are at an increased risk for a blood clot.  You were prescribed a blood thinner, Aspirin  81mg , to be taken twice daily for a total of 4 weeks from surgery to help reduce your risk of getting a blood clot.  Signs of a pulmonary embolus (blood clot in the lungs) include sudden short of breath, feeling lightheaded or dizzy, chest pain with a deep breath, rapid pulse rapid breathing.  Signs of a blood clot in your arms or legs include new unexplained swelling and cramping, warm, red or darkened skin around the painful area.  Please  call the office or 911 right away if these signs or symptoms develop.  PRECAUTIONS:   If you experience chest pain or shortness of breath - call 911 immediately for transfer to the hospital emergency department.   If you develop a fever greater that 101 F, purulent drainage from wound, increased redness or drainage from wound, foul odor from the wound/dressing, or calf pain - CONTACT YOUR SURGEON.                                                   FOLLOW-UP APPOINTMENTS:  If you do not already have a post-op appointment, please  call the office for an appointment to be seen by your surgeon.  Guidelines for how soon to be seen are listed in your After Visit Summary, but are typically between 2-3 weeks after surgery.  If you have a specialized bandage, you may be told to follow up 1 week after surgery.  OTHER INSTRUCTIONS:  Knee Replacement:  Do not place pillow under knee, focus on keeping the knee straight while resting.  Place foam block, curve side up under heel at all times except when walking.  DO NOT modify, tear, cut, or change the foam block in any way.  POST-OPERATIVE OPIOID TAPER INSTRUCTIONS: It is important to wean off of your opioid medication as soon as possible. If you do not need pain medication after your surgery it is ok to stop day one. Opioids include: Codeine , Hydrocodone (Norco, Vicodin), Oxycodone (Percocet, oxycontin ) and hydromorphone  amongst others.  Long term and even short term use of opiods can cause: Increased pain response Dependence Constipation Depression Respiratory depression And more.  Withdrawal symptoms can include Flu like symptoms Nausea, vomiting And more Techniques to manage these symptoms Hydrate well Eat regular healthy meals Stay active Use relaxation techniques(deep breathing, meditating, yoga) Do Not substitute Alcohol  to help with tapering If you have been on opioids for less than two weeks and do not have pain than it is ok to stop all  together.  Plan to wean off of opioids This plan should start within one week post op of your joint replacement. Maintain the same interval or time between taking each dose and first decrease the dose.  Cut the total daily intake of opioids by one tablet each day Next start to increase the time between doses. The last dose that should be eliminated is the evening dose.   MAKE SURE YOU:  Understand these instructions.  Get help right away if you are not doing well or get worse.    Thank you for letting us  be a part of your medical care team.  It is a privilege we respect greatly.  We hope these instructions will help you stay on track for a fast and full recovery!            Signed: Kierah Goatley A Brennley Curtice 02/03/2024, 6:48 AM

## 2024-02-03 NOTE — Progress Notes (Signed)
" ° ° °  3 Days Post-Op Procedures (LRB): TOTAL KNEE REVISION (Left)  Subjective: Doing better this morning with pain control.  Had a difficult day yesterday but managed to make some progress with physical therapy in the afternoon session.  She is hopeful to discharge home today if pain is controlled.  Has not yet had a bowel movement since surgery.   Yesterday's total administered Morphine  Milligram Equivalents: 71.5   Objective:   VITALS:   Vitals:   02/02/24 0917 02/02/24 1322 02/02/24 2110 02/03/24 0508  BP: 122/73 (!) 104/58 110/64 125/66  Pulse: 78 72 73 63  Resp: 16 16 15 16   Temp: 98 F (36.7 C) (!) 97.5 F (36.4 C) 98 F (36.7 C) 98.7 F (37.1 C)  TempSrc: Oral Oral Oral   SpO2: 93% 95% 93% 96%  Weight:      Height:        Resting in bed in NAD Sensation intact distally Intact pulses distally Dorsiflexion/Plantar flexion intact Incision: dressing C/D/I Compartment soft Wound vac holding suction, no fluid output in cannister Tolerates gentle left hip ROM, no significant swelling of hip Wiggles toes appropriately   Lab Results  Component Value Date   WBC 7.9 02/02/2024   HGB 10.6 (L) 02/02/2024   HCT 32.4 (L) 02/02/2024   MCV 88.8 02/02/2024   PLT 187 02/02/2024   BMET    Component Value Date/Time   NA 133 (L) 02/01/2024 0334   K 4.4 02/01/2024 0334   CL 102 02/01/2024 0334   CO2 24 02/01/2024 0334   GLUCOSE 128 (H) 02/01/2024 0334   BUN 17 02/01/2024 0334   CREATININE 0.54 02/01/2024 0334   CALCIUM 9.3 02/01/2024 0334   GFRNONAA >60 02/01/2024 0334      Xray: TKA components in good position no adverse features  Assessment/Plan: 3 Days Post-Op   Principal Problem:   Failed total knee replacement  S/p L TKA revision 01/31/24  Post op recs: WB: WBAT Abx: ancef  x23 hours post op, followed by 7 days of cefadroxil  500 twice daily Imaging: PACU xrays DVT prophylaxis: Aspirin  81mg  BID x4 weeks Follow up: 1 week after surgery for a wound  check with Dr. Edna at [facility].  Address: [address], [place], [place] [address]  Office Phone: [phone]    TORIBIO DELENA EDNA 02/03/2024, 6:46 AM    Contact information:   Weekdays 7am-5pm epic message Dr. Edna, or call office for patient follow up: [phone] After hours and holidays please check [email or web address] for group call information for [facility]   "

## 2024-02-05 LAB — AEROBIC/ANAEROBIC CULTURE W GRAM STAIN (SURGICAL/DEEP WOUND)
Culture: NO GROWTH
Culture: NO GROWTH
Culture: NO GROWTH
Gram Stain: NONE SEEN
Gram Stain: NONE SEEN
Gram Stain: NONE SEEN

## 2024-03-31 ENCOUNTER — Ambulatory Visit
